# Patient Record
Sex: Male | Born: 1975 | Race: White | Hispanic: No | Marital: Single | Smoking: Never smoker
Health system: Southern US, Community
[De-identification: ages and names within clinical notes are randomized; demographics above are authoritative.]

## PROBLEM LIST (undated history)

## (undated) DIAGNOSIS — K7400 Hepatic fibrosis, unspecified: Secondary | ICD-10-CM

## (undated) DIAGNOSIS — K760 Fatty (change of) liver, not elsewhere classified: Secondary | ICD-10-CM

## (undated) DIAGNOSIS — Z1509 Genetic susceptibility to other malignant neoplasm: Secondary | ICD-10-CM

## (undated) DIAGNOSIS — R7303 Prediabetes: Secondary | ICD-10-CM

## (undated) DIAGNOSIS — Z87898 Personal history of other specified conditions: Secondary | ICD-10-CM

## (undated) DIAGNOSIS — Z860100 Personal history of colon polyps, unspecified: Secondary | ICD-10-CM

## (undated) DIAGNOSIS — F341 Dysthymic disorder: Secondary | ICD-10-CM

## (undated) DIAGNOSIS — K209 Esophagitis, unspecified without bleeding: Secondary | ICD-10-CM

## (undated) DIAGNOSIS — E119 Type 2 diabetes mellitus without complications: Secondary | ICD-10-CM

## (undated) DIAGNOSIS — F329 Major depressive disorder, single episode, unspecified: Secondary | ICD-10-CM

## (undated) DIAGNOSIS — I10 Essential (primary) hypertension: Secondary | ICD-10-CM

## (undated) DIAGNOSIS — F840 Autistic disorder: Secondary | ICD-10-CM

## (undated) DIAGNOSIS — E785 Hyperlipidemia, unspecified: Secondary | ICD-10-CM

## (undated) DIAGNOSIS — R160 Hepatomegaly, not elsewhere classified: Secondary | ICD-10-CM

## (undated) DIAGNOSIS — Z8709 Personal history of other diseases of the respiratory system: Secondary | ICD-10-CM

## (undated) DIAGNOSIS — T7840XA Allergy, unspecified, initial encounter: Secondary | ICD-10-CM

## (undated) DIAGNOSIS — J302 Other seasonal allergic rhinitis: Secondary | ICD-10-CM

## (undated) DIAGNOSIS — K222 Esophageal obstruction: Secondary | ICD-10-CM

## (undated) DIAGNOSIS — F419 Anxiety disorder, unspecified: Secondary | ICD-10-CM

## (undated) DIAGNOSIS — K219 Gastro-esophageal reflux disease without esophagitis: Secondary | ICD-10-CM

## (undated) DIAGNOSIS — F32A Depression, unspecified: Secondary | ICD-10-CM

## (undated) HISTORY — PX: DENTAL SURGERY: SHX609

## (undated) HISTORY — DX: Anxiety disorder, unspecified: F41.9

## (undated) HISTORY — DX: Major depressive disorder, single episode, unspecified: F32.9

## (undated) HISTORY — DX: Allergy, unspecified, initial encounter: T78.40XA

## (undated) HISTORY — DX: Essential (primary) hypertension: I10

## (undated) HISTORY — DX: Personal history of colon polyps, unspecified: Z86.0100

## (undated) HISTORY — DX: Esophageal obstruction: K22.2

## (undated) HISTORY — DX: Dysthymic disorder: F34.1

## (undated) HISTORY — DX: Fatty (change of) liver, not elsewhere classified: K76.0

## (undated) HISTORY — DX: Hepatic fibrosis, unspecified: K74.00

## (undated) HISTORY — DX: Type 2 diabetes mellitus without complications: E11.9

## (undated) HISTORY — DX: Other seasonal allergic rhinitis: J30.2

## (undated) HISTORY — DX: Gastro-esophageal reflux disease without esophagitis: K21.9

## (undated) HISTORY — DX: Hepatomegaly, not elsewhere classified: R16.0

## (undated) HISTORY — DX: Esophagitis, unspecified: K20.9

## (undated) HISTORY — DX: Hyperlipidemia, unspecified: E78.5

## (undated) HISTORY — DX: Esophagitis, unspecified without bleeding: K20.90

## (undated) HISTORY — PX: HERNIA REPAIR: SHX51

## (undated) HISTORY — DX: Personal history of colonic polyps: Z86.010

## (undated) HISTORY — DX: Depression, unspecified: F32.A

## (undated) HISTORY — DX: Genetic susceptibility to other malignant neoplasm: Z15.09

---

## 2002-03-02 ENCOUNTER — Encounter: Payer: Self-pay | Admitting: Gastroenterology

## 2002-03-02 ENCOUNTER — Ambulatory Visit (HOSPITAL_COMMUNITY): Admission: RE | Admit: 2002-03-02 | Discharge: 2002-03-02 | Payer: Self-pay | Admitting: Gastroenterology

## 2002-03-04 ENCOUNTER — Encounter: Payer: Self-pay | Admitting: Gastroenterology

## 2002-03-04 DIAGNOSIS — K209 Esophagitis, unspecified without bleeding: Secondary | ICD-10-CM | POA: Insufficient documentation

## 2004-01-17 ENCOUNTER — Ambulatory Visit: Payer: Self-pay | Admitting: Family Medicine

## 2004-01-25 ENCOUNTER — Ambulatory Visit: Payer: Self-pay | Admitting: Family Medicine

## 2004-03-21 ENCOUNTER — Ambulatory Visit: Payer: Self-pay | Admitting: Gastroenterology

## 2004-05-28 ENCOUNTER — Ambulatory Visit: Payer: Self-pay | Admitting: Family Medicine

## 2004-07-16 ENCOUNTER — Ambulatory Visit: Payer: Self-pay | Admitting: Family Medicine

## 2004-09-24 ENCOUNTER — Ambulatory Visit: Payer: Self-pay | Admitting: Family Medicine

## 2004-09-26 ENCOUNTER — Ambulatory Visit: Payer: Self-pay | Admitting: Family Medicine

## 2004-11-29 ENCOUNTER — Ambulatory Visit: Payer: Self-pay | Admitting: Family Medicine

## 2004-12-02 ENCOUNTER — Ambulatory Visit: Payer: Self-pay | Admitting: Family Medicine

## 2005-03-10 ENCOUNTER — Ambulatory Visit: Payer: Self-pay | Admitting: Family Medicine

## 2005-07-15 ENCOUNTER — Ambulatory Visit: Payer: Self-pay | Admitting: Family Medicine

## 2005-08-28 ENCOUNTER — Ambulatory Visit: Payer: Self-pay | Admitting: Family Medicine

## 2006-01-06 ENCOUNTER — Ambulatory Visit: Payer: Self-pay | Admitting: Family Medicine

## 2006-03-03 ENCOUNTER — Ambulatory Visit: Payer: Self-pay | Admitting: Family Medicine

## 2006-06-02 ENCOUNTER — Ambulatory Visit: Payer: Self-pay | Admitting: Family Medicine

## 2007-04-27 ENCOUNTER — Ambulatory Visit: Payer: Self-pay | Admitting: Gastroenterology

## 2007-05-13 ENCOUNTER — Encounter: Payer: Self-pay | Admitting: Gastroenterology

## 2007-05-13 ENCOUNTER — Ambulatory Visit: Payer: Self-pay | Admitting: Gastroenterology

## 2007-05-13 DIAGNOSIS — D126 Benign neoplasm of colon, unspecified: Secondary | ICD-10-CM | POA: Insufficient documentation

## 2007-10-07 ENCOUNTER — Telehealth: Payer: Self-pay | Admitting: Gastroenterology

## 2007-10-08 ENCOUNTER — Encounter: Payer: Self-pay | Admitting: Gastroenterology

## 2008-04-24 ENCOUNTER — Telehealth: Payer: Self-pay | Admitting: Gastroenterology

## 2008-04-25 ENCOUNTER — Encounter: Payer: Self-pay | Admitting: Gastroenterology

## 2008-04-25 DIAGNOSIS — K649 Unspecified hemorrhoids: Secondary | ICD-10-CM | POA: Insufficient documentation

## 2008-04-25 DIAGNOSIS — F341 Dysthymic disorder: Secondary | ICD-10-CM | POA: Insufficient documentation

## 2008-04-25 DIAGNOSIS — K219 Gastro-esophageal reflux disease without esophagitis: Secondary | ICD-10-CM | POA: Insufficient documentation

## 2008-04-26 ENCOUNTER — Ambulatory Visit: Payer: Self-pay | Admitting: Gastroenterology

## 2008-04-26 DIAGNOSIS — R1310 Dysphagia, unspecified: Secondary | ICD-10-CM | POA: Insufficient documentation

## 2009-04-09 ENCOUNTER — Emergency Department (HOSPITAL_COMMUNITY): Admission: EM | Admit: 2009-04-09 | Discharge: 2009-04-09 | Payer: Self-pay | Admitting: Emergency Medicine

## 2010-05-28 ENCOUNTER — Other Ambulatory Visit: Payer: Self-pay | Admitting: Sports Medicine

## 2010-05-28 DIAGNOSIS — M545 Low back pain: Secondary | ICD-10-CM

## 2010-05-28 DIAGNOSIS — M5430 Sciatica, unspecified side: Secondary | ICD-10-CM

## 2010-05-31 ENCOUNTER — Ambulatory Visit
Admission: RE | Admit: 2010-05-31 | Discharge: 2010-05-31 | Disposition: A | Discharge status: Home / Self Care | Payer: Medicare Other | Source: Ambulatory Visit | Attending: Sports Medicine | Admitting: Sports Medicine

## 2010-05-31 DIAGNOSIS — M545 Low back pain: Secondary | ICD-10-CM

## 2010-05-31 DIAGNOSIS — M5430 Sciatica, unspecified side: Secondary | ICD-10-CM

## 2010-06-13 ENCOUNTER — Ambulatory Visit: Payer: Medicare Other | Attending: Sports Medicine | Admitting: Physical Therapy

## 2010-06-13 DIAGNOSIS — IMO0001 Reserved for inherently not codable concepts without codable children: Secondary | ICD-10-CM | POA: Insufficient documentation

## 2010-06-13 DIAGNOSIS — M545 Low back pain, unspecified: Secondary | ICD-10-CM | POA: Insufficient documentation

## 2010-06-13 DIAGNOSIS — R5381 Other malaise: Secondary | ICD-10-CM | POA: Insufficient documentation

## 2010-06-13 DIAGNOSIS — R293 Abnormal posture: Secondary | ICD-10-CM | POA: Insufficient documentation

## 2010-06-17 ENCOUNTER — Ambulatory Visit: Payer: Medicare Other | Admitting: Physical Therapy

## 2010-06-20 ENCOUNTER — Ambulatory Visit: Payer: Medicare Other | Admitting: Physical Therapy

## 2010-06-25 ENCOUNTER — Ambulatory Visit: Payer: Medicare Other | Admitting: *Deleted

## 2010-06-28 ENCOUNTER — Ambulatory Visit: Payer: Medicare Other | Admitting: *Deleted

## 2010-07-03 ENCOUNTER — Ambulatory Visit: Payer: Medicare Other | Admitting: Physical Therapy

## 2010-07-05 ENCOUNTER — Ambulatory Visit: Payer: Medicare Other | Attending: Sports Medicine | Admitting: *Deleted

## 2010-07-05 DIAGNOSIS — M545 Low back pain, unspecified: Secondary | ICD-10-CM | POA: Insufficient documentation

## 2010-07-05 DIAGNOSIS — R293 Abnormal posture: Secondary | ICD-10-CM | POA: Insufficient documentation

## 2010-07-05 DIAGNOSIS — R5381 Other malaise: Secondary | ICD-10-CM | POA: Insufficient documentation

## 2010-07-05 DIAGNOSIS — IMO0001 Reserved for inherently not codable concepts without codable children: Secondary | ICD-10-CM | POA: Insufficient documentation

## 2010-07-08 ENCOUNTER — Ambulatory Visit: Payer: Medicare Other | Admitting: Physical Therapy

## 2010-07-11 ENCOUNTER — Ambulatory Visit: Payer: Medicare Other | Admitting: *Deleted

## 2010-07-16 ENCOUNTER — Ambulatory Visit: Payer: Medicare Other | Admitting: Physical Therapy

## 2010-07-18 ENCOUNTER — Ambulatory Visit: Payer: Medicare Other | Admitting: Physical Therapy

## 2010-07-23 ENCOUNTER — Ambulatory Visit: Payer: Medicare Other | Admitting: *Deleted

## 2010-07-25 ENCOUNTER — Ambulatory Visit: Payer: Medicare Other | Admitting: *Deleted

## 2010-07-30 ENCOUNTER — Encounter: Payer: Medicare Other | Admitting: Physical Therapy

## 2010-07-31 ENCOUNTER — Ambulatory Visit: Payer: Medicare Other | Admitting: Physical Therapy

## 2010-08-01 ENCOUNTER — Ambulatory Visit: Payer: Medicare Other | Admitting: Physical Therapy

## 2010-08-06 ENCOUNTER — Ambulatory Visit: Payer: Medicare Other | Attending: Sports Medicine | Admitting: *Deleted

## 2010-08-06 DIAGNOSIS — M545 Low back pain, unspecified: Secondary | ICD-10-CM | POA: Insufficient documentation

## 2010-08-06 DIAGNOSIS — R5381 Other malaise: Secondary | ICD-10-CM | POA: Insufficient documentation

## 2010-08-06 DIAGNOSIS — R293 Abnormal posture: Secondary | ICD-10-CM | POA: Insufficient documentation

## 2010-08-06 DIAGNOSIS — IMO0001 Reserved for inherently not codable concepts without codable children: Secondary | ICD-10-CM | POA: Insufficient documentation

## 2010-08-08 ENCOUNTER — Encounter: Payer: Medicare Other | Admitting: Physical Therapy

## 2010-08-13 ENCOUNTER — Ambulatory Visit: Payer: Medicare Other | Admitting: Physical Therapy

## 2010-08-15 ENCOUNTER — Ambulatory Visit: Payer: Medicare Other | Admitting: *Deleted

## 2010-09-24 ENCOUNTER — Emergency Department (HOSPITAL_COMMUNITY)
Admission: EM | Admit: 2010-09-24 | Discharge: 2010-09-24 | Disposition: A | Discharge status: Home / Self Care | Payer: Medicare Other | Attending: Emergency Medicine | Admitting: Emergency Medicine

## 2010-09-24 DIAGNOSIS — K219 Gastro-esophageal reflux disease without esophagitis: Secondary | ICD-10-CM | POA: Insufficient documentation

## 2010-09-24 DIAGNOSIS — N5089 Other specified disorders of the male genital organs: Secondary | ICD-10-CM | POA: Insufficient documentation

## 2010-09-24 DIAGNOSIS — R300 Dysuria: Secondary | ICD-10-CM | POA: Insufficient documentation

## 2010-09-24 LAB — URINALYSIS, ROUTINE W REFLEX MICROSCOPIC
Bilirubin Urine: NEGATIVE
Glucose, UA: NEGATIVE mg/dL
Hgb urine dipstick: NEGATIVE
Ketones, ur: 15 mg/dL — AB
Leukocytes, UA: NEGATIVE
Nitrite: NEGATIVE
Protein, ur: NEGATIVE mg/dL
Specific Gravity, Urine: 1.028 (ref 1.005–1.030)
Urobilinogen, UA: 1 mg/dL (ref 0.0–1.0)
pH: 6 (ref 5.0–8.0)

## 2010-10-03 ENCOUNTER — Telehealth: Payer: Self-pay | Admitting: Gastroenterology

## 2010-10-03 NOTE — Telephone Encounter (Signed)
Try guanefisin syrup

## 2010-10-03 NOTE — Telephone Encounter (Signed)
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 Pt has not been seen in our ofc since 04/26/2008. COLON normal 04/25/08, EGD with [facility] 04/26/08 for stricture. Hx of anxiety/depression, hemorrrhoids, GERD COLON polyps. Mom reports pt has had a hacking cough since 09/09/10 when he was taken off Abilify and placed on Propranolol. The drug was changed to help pt lose weight and ordered by Dr Duayne Cal pt's Mental Health Doctor. Dr Duayne Cal is only in the office the 1st Tuesday of every month. Pt's also has a problem with swelling in the scrotal area and has seen a Urologist and he is on Doxycycline per MOM. Pt went to the ER on 09/24/10 for c/o

## 2010-10-03 NOTE — Telephone Encounter (Signed)
Dr Arlyce Dice, while waiting for Dr [facility] to call back, do you have any recommendations for coughing problem and or irritated throat? Mom reports pt was on Nexium BID, but insurance will only pay for daily?

## 2010-10-03 NOTE — Telephone Encounter (Signed)
Spoke with RN Tim Nelson at Dr Tim Nelson ofc and informed her of pt's problem Tim Nelson asked that I ask the mother to call after I spoke with her so she can make an appt for problem to be assessed. Informed her and mother that Dr Arlyce Dice ordered guaifenesin syrup for the cough. Instructed mother that if he can swallow a big pill, plain Mucinex is ok. Mother stated understanding.

## 2010-10-21 ENCOUNTER — Telehealth: Payer: Self-pay | Admitting: Gastroenterology

## 2010-10-21 NOTE — Telephone Encounter (Signed)
Left message for pt to call back.  Pts sister states that the pt is having problems with his reflux. Requesting a sooner appt. Pt scheduled to see Dr. Arlyce Dice 10/23/10@8 :45am. They are aware of appt date and time.

## 2010-10-23 ENCOUNTER — Ambulatory Visit (INDEPENDENT_AMBULATORY_CARE_PROVIDER_SITE_OTHER): Payer: Medicare Other | Admitting: Gastroenterology

## 2010-10-23 ENCOUNTER — Encounter: Payer: Self-pay | Admitting: Gastroenterology

## 2010-10-23 DIAGNOSIS — K219 Gastro-esophageal reflux disease without esophagitis: Secondary | ICD-10-CM

## 2010-10-23 DIAGNOSIS — D126 Benign neoplasm of colon, unspecified: Secondary | ICD-10-CM

## 2010-10-23 DIAGNOSIS — Z800 Family history of malignant neoplasm of digestive organs: Secondary | ICD-10-CM | POA: Insufficient documentation

## 2010-10-23 NOTE — Progress Notes (Signed)
Mr. Mitton has returned for evaluation of severe pyrosis and dysphagia. He has a history of GERD and esophageal stricture. He was last dilated in 2010. Exam was otherwise unremarkable.  He is complaining of severe pyrosis with sore throat and cough. Symptoms occur throughout the day. He also is having recurrent dysphagia. He's on no gastric irritants but he does use a steroidal nasal spray   Review of Systems: He complains of low back pain due to lumbar disc disease. Since psychotropics have been changed. Pertinent positive and negative review of systems were noted in the above HPI section. All other review of systems were otherwise negative.    Current Medications, Allergies, Past Medical History, Past Surgical History, Family History and Social History were reviewed in [employer] electronic medical record  Vital signs were reviewed in today's medical record. Physical Exam: General: Well developed , well nourished, no acute distress Head: Normocephalic and atraumatic Eyes:  sclerae anicteric, EOMI Ears: Normal auditory acuity Mouth: No deformity or lesions Lungs: Clear throughout to auscultation Heart: Regular rate and rhythm; no murmurs, rubs or bruits Abdomen: Soft, non tender and non distended. No masses, hepatosplenomegaly or hernias noted. Normal Bowel sounds Rectal:deferred Musculoskeletal: Symmetrical with no gross deformities  Pulses:  Normal pulses noted Extremities: No clubbing, cyanosis, edema or deformities noted Neurological: Alert oriented x 4, grossly nonfocal Psychological:  Alert and cooperative. Normal mood and affect

## 2010-10-23 NOTE — Assessment & Plan Note (Signed)
Last colonoscopy 2010. Plan followup colonoscopy 2015

## 2010-10-23 NOTE — Patient Instructions (Signed)
Your Endoscopy is scheduled on 10/29/2010 at 2pm We are giving you samples of Aciphex to take once in the AM before breakfast and once before supper

## 2010-10-23 NOTE — Assessment & Plan Note (Addendum)
Patient remains symptomatic despite daily Dexilant. Candida esophagitis should be ruled out in view of his use of fluticasone nasal spray.  With recurrent dysphagia an esophageal stricture should be ruled out.  Current medications #1 empiric trial of AcipHex 20 mg before breakfast and dinner #2 repeat upper endoscopy with dilatation as indicated

## 2010-10-29 ENCOUNTER — Ambulatory Visit (AMBULATORY_SURGERY_CENTER): Payer: Medicare Other | Admitting: Gastroenterology

## 2010-10-29 ENCOUNTER — Encounter: Payer: Self-pay | Admitting: Gastroenterology

## 2010-10-29 VITALS — BP 143/97 | HR 90 | Temp 98.9°F | Resp 24 | Ht 70.0 in | Wt 191.0 lb

## 2010-10-29 DIAGNOSIS — K219 Gastro-esophageal reflux disease without esophagitis: Secondary | ICD-10-CM

## 2010-10-29 DIAGNOSIS — K222 Esophageal obstruction: Secondary | ICD-10-CM

## 2010-10-29 MED ORDER — SODIUM CHLORIDE 0.9 % IV SOLN: 500.0000 mL | INTRAVENOUS | Status: DC

## 2010-10-29 NOTE — Progress Notes (Signed)
Pt was sedated with propofol by Brennan Bailey, CRNA.  Maw  Pt tolerated the EGD very well. maw

## 2010-10-29 NOTE — Patient Instructions (Signed)
Please review discharge instructions (blue and green sheets)  Resume your normal medications  Follow dilation diet today- see handout

## 2010-10-30 ENCOUNTER — Telehealth: Payer: Self-pay | Admitting: *Deleted

## 2010-10-30 NOTE — Telephone Encounter (Signed)

## 2010-11-04 ENCOUNTER — Telehealth: Payer: Self-pay | Admitting: Gastroenterology

## 2010-11-04 MED ORDER — RABEPRAZOLE SODIUM 20 MG PO TBEC
20.0000 mg | DELAYED_RELEASE_TABLET | Freq: Two times a day (BID) | ORAL | Status: DC
Start: 2010-11-04 — End: 2010-12-11

## 2010-11-04 NOTE — Telephone Encounter (Signed)
Pt states he did not have any more of the aciphex. Would like for [place] to call in the script for him to take and see it that helps before scheduling the GES. Dr. Arlyce Dice notified.

## 2010-11-04 NOTE — Telephone Encounter (Signed)
Ok to send in Rx?  

## 2010-11-04 NOTE — Telephone Encounter (Signed)
RX sent to pharmacy  

## 2010-11-04 NOTE — Telephone Encounter (Signed)
Make sure he is on bid aciphex. Schedule GES then OV

## 2010-11-04 NOTE — Telephone Encounter (Signed)
Pts mother is calling stating that he started coughing again Thursday night and he is complaining of both sides of his throat being sore. Pt has egd on 10/29/10 and results were normal. Pts mother wants to know if Dr. Arlyce Dice has any suggestions for the pt. Please advise.

## 2010-11-05 ENCOUNTER — Telehealth: Payer: Self-pay | Admitting: Gastroenterology

## 2010-11-05 MED ORDER — PANTOPRAZOLE SODIUM 40 MG PO TBEC: 40.0000 mg | DELAYED_RELEASE_TABLET | Freq: Every day | ORAL | Status: DC

## 2010-11-05 NOTE — Telephone Encounter (Signed)
Called pt to inform new medication sent to pharmacy

## 2010-11-05 NOTE — Telephone Encounter (Signed)
Dr Arlyce Dice, Aciphex to expensive for pt. What would you like to prescribe for him

## 2010-11-05 NOTE — Telephone Encounter (Signed)
protonix 40mg qd

## 2010-11-09 ENCOUNTER — Emergency Department (HOSPITAL_COMMUNITY)
Admission: EM | Admit: 2010-11-09 | Discharge: 2010-11-09 | Disposition: A | Discharge status: Home / Self Care | Payer: Medicare Other | Attending: Emergency Medicine | Admitting: Emergency Medicine

## 2010-11-09 ENCOUNTER — Emergency Department (HOSPITAL_COMMUNITY): Payer: Medicare Other

## 2010-11-09 DIAGNOSIS — R05 Cough: Secondary | ICD-10-CM | POA: Insufficient documentation

## 2010-11-09 DIAGNOSIS — K219 Gastro-esophageal reflux disease without esophagitis: Secondary | ICD-10-CM | POA: Insufficient documentation

## 2010-11-09 DIAGNOSIS — R059 Cough, unspecified: Secondary | ICD-10-CM | POA: Insufficient documentation

## 2010-11-18 ENCOUNTER — Other Ambulatory Visit: Payer: Self-pay | Admitting: Gastroenterology

## 2010-11-18 ENCOUNTER — Telehealth: Payer: Self-pay | Admitting: Gastroenterology

## 2010-11-18 MED ORDER — OMEPRAZOLE 20 MG PO CPDR: 20.0000 mg | DELAYED_RELEASE_CAPSULE | Freq: Two times a day (BID) | ORAL | Status: DC

## 2010-11-18 NOTE — Telephone Encounter (Signed)
Pts mother states that he is still coughing, she states it is a hacking agitated cough. States that he has already had an EGD done. Mother wants to know what the next step is. Dr. Arlyce Dice please advise.

## 2010-11-18 NOTE — Telephone Encounter (Signed)
Pt aware.

## 2010-11-18 NOTE — Telephone Encounter (Signed)
Mother states that his insurance denied the aciphex-Medicare and Medicaid both denied it. He is taking protonix 40mg  daily. Pt scheduled for ges 12/09/10 at [facility] arrival time 12:45pm, scan at 1pm. Pt scheduled to see Dr. Arlyce Dice 12/11/10@9 :30am. Pt to be NPO after 7am on 12/09/10 and hold their protonix the day before the ges. Pt aware.

## 2010-11-18 NOTE — Telephone Encounter (Signed)
Can try omeprazole twice a day

## 2010-11-18 NOTE — Telephone Encounter (Signed)
Is he on aciphex twice a day? Need repeat GES, then OV

## 2010-11-19 ENCOUNTER — Ambulatory Visit: Payer: Medicare Other | Admitting: Gastroenterology

## 2010-12-09 ENCOUNTER — Encounter (HOSPITAL_COMMUNITY)
Admission: RE | Admit: 2010-12-09 | Discharge: 2010-12-09 | Disposition: A | Discharge status: Home / Self Care | Payer: Medicare Other | Source: Ambulatory Visit | Attending: Gastroenterology | Admitting: Gastroenterology

## 2010-12-09 DIAGNOSIS — R109 Unspecified abdominal pain: Secondary | ICD-10-CM | POA: Insufficient documentation

## 2010-12-09 MED ORDER — TECHNETIUM TC 99M SULFUR COLLOID
2.0000 | Freq: Once | INTRAVENOUS | Status: AC | PRN
  Administered 2010-12-09: 2 via INTRAVENOUS

## 2010-12-11 ENCOUNTER — Encounter: Payer: Self-pay | Admitting: Gastroenterology

## 2010-12-11 ENCOUNTER — Ambulatory Visit (INDEPENDENT_AMBULATORY_CARE_PROVIDER_SITE_OTHER): Payer: Medicare Other | Admitting: Gastroenterology

## 2010-12-11 DIAGNOSIS — Z800 Family history of malignant neoplasm of digestive organs: Secondary | ICD-10-CM

## 2010-12-11 DIAGNOSIS — K219 Gastro-esophageal reflux disease without esophagitis: Secondary | ICD-10-CM

## 2010-12-11 DIAGNOSIS — K222 Esophageal obstruction: Secondary | ICD-10-CM

## 2010-12-11 NOTE — Progress Notes (Signed)
Pt already scheduled for a follow up appointment

## 2010-12-11 NOTE — Assessment & Plan Note (Signed)
Asymptomatic on omeprazole.

## 2010-12-11 NOTE — Assessment & Plan Note (Signed)
Plan followup colonoscopy 2014 

## 2010-12-11 NOTE — Patient Instructions (Signed)
Follow up as needed

## 2010-12-11 NOTE — Progress Notes (Signed)
History of Present Illness:  Mr. Wares has returned following upper endoscopy with [facility] dilatation of a distal esophageal stricture. Dysphagia has resolved. In addition, his cough has subsided. He denies pyrosis. He remains on omeprazole 20 mg daily. He complains of excess gas but notes that he consumes a large amount of water throughout the day. Gas has been greatly improved with taking Gas-X.    Review of Systems: Pertinent positive and negative review of systems were noted in the above HPI section. All other review of systems were otherwise negative.    Current Medications, Allergies, Past Medical History, Past Surgical History, Family History and Social History were reviewed in [employer] electronic medical record  Vital signs were reviewed in today's medical record. Physical Exam: General: Well developed , well nourished, no acute distress

## 2012-02-04 HISTORY — PX: OTHER SURGICAL HISTORY: SHX169

## 2012-05-03 ENCOUNTER — Encounter: Payer: Self-pay | Admitting: Gastroenterology

## 2012-06-08 ENCOUNTER — Encounter: Payer: Self-pay | Admitting: Gastroenterology

## 2012-06-14 ENCOUNTER — Ambulatory Visit (AMBULATORY_SURGERY_CENTER): Payer: Medicare Other | Admitting: *Deleted

## 2012-06-14 VITALS — Ht 71.0 in | Wt 233.0 lb

## 2012-06-14 DIAGNOSIS — Z1211 Encounter for screening for malignant neoplasm of colon: Secondary | ICD-10-CM

## 2012-06-14 DIAGNOSIS — Z800 Family history of malignant neoplasm of digestive organs: Secondary | ICD-10-CM

## 2012-06-14 DIAGNOSIS — Z86010 Personal history of colonic polyps: Secondary | ICD-10-CM

## 2012-06-14 MED ORDER — NA SULFATE-K SULFATE-MG SULF 17.5-3.13-1.6 GM/177ML PO SOLN: ORAL | Status: DC

## 2012-06-21 ENCOUNTER — Ambulatory Visit (AMBULATORY_SURGERY_CENTER): Payer: Medicare Other | Admitting: Gastroenterology

## 2012-06-21 ENCOUNTER — Encounter: Payer: Self-pay | Admitting: Gastroenterology

## 2012-06-21 VITALS — BP 125/76 | HR 73 | Temp 97.2°F | Resp 17 | Ht 71.0 in | Wt 233.0 lb

## 2012-06-21 DIAGNOSIS — Z86010 Personal history of colonic polyps: Secondary | ICD-10-CM

## 2012-06-21 DIAGNOSIS — Z800 Family history of malignant neoplasm of digestive organs: Secondary | ICD-10-CM

## 2012-06-21 DIAGNOSIS — Z1211 Encounter for screening for malignant neoplasm of colon: Secondary | ICD-10-CM

## 2012-06-21 MED ORDER — SODIUM CHLORIDE 0.9 % IV SOLN: 500.0000 mL | INTRAVENOUS | Status: DC

## 2012-06-21 NOTE — Patient Instructions (Signed)
Normal colon exam today. Repeat colonoscopy in 5 years. Resume current medications.  Call us with any questions or concerns. Thank you!!  YOU HAD AN ENDOSCOPIC PROCEDURE TODAY AT THE [facility]: Refer to the procedure report that was given to you for any specific questions about what was found during the examination.  If the procedure report does not answer your questions, please call your gastroenterologist to clarify.  If you requested that your care partner not be given the details of your procedure findings, then the procedure report has been included in a sealed envelope for you to review at your convenience later.  YOU SHOULD EXPECT: Some feelings of bloating in the abdomen. Passage of more gas than usual.  Walking can help get rid of the air that was put into your GI tract during the procedure and reduce the bloating. If you had a lower endoscopy (such as a colonoscopy or flexible sigmoidoscopy) you may notice spotting of blood in your stool or on the toilet paper. If you underwent a bowel prep for your procedure, then you may not have a normal bowel movement for a few days.  DIET: Your first meal following the procedure should be a light meal and then it is ok to progress to your normal diet.  A half-sandwich or bowl of soup is an example of a good first meal.  Heavy or fried foods are harder to digest and may make you feel nauseous or bloated.  Likewise meals heavy in dairy and vegetables can cause extra gas to form and this can also increase the bloating.  Drink plenty of fluids but you should avoid alcoholic beverages for 24 hours.  ACTIVITY: Your care partner should take you home directly after the procedure.  You should plan to take it easy, moving slowly for the rest of the day.  You can resume normal activity the day after the procedure however you should NOT DRIVE or use heavy machinery for 24 hours (because of the sedation medicines used during the test).    SYMPTOMS TO  REPORT IMMEDIATELY: A gastroenterologist can be reached at any hour.  During normal business hours, 8:30 AM to 5:00 PM Monday through Friday, call [phone].  After hours and on weekends, please call the GI answering service at [phone] who will take a message and have the physician on call contact you.   Following lower endoscopy (colonoscopy or flexible sigmoidoscopy):  Excessive amounts of blood in the stool  Significant tenderness or worsening of abdominal pains  Swelling of the abdomen that is new, acute  Fever of 100F or higher  Following upper endoscopy (EGD)  Vomiting of blood or coffee ground material  New chest pain or pain under the shoulder blades  Painful or persistently difficult swallowing  New shortness of breath  Fever of 100F or higher  Black, tarry-looking stools  FOLLOW UP: If any biopsies were taken you will be contacted by phone or by letter within the next 1-3 weeks.  Call your gastroenterologist if you have not heard about the biopsies in 3 weeks.  Our staff will call the home number listed on your records the next business day following your procedure to check on you and address any questions or concerns that you may have at that time regarding the information given to you following your procedure. This is a courtesy call and so if there is no answer at the home number and we have not heard from you through the emergency   physician on call, we will assume that you have returned to your regular daily activities without incident.  SIGNATURES/CONFIDENTIALITY: You and/or your care partner have signed paperwork which will be entered into your electronic medical record.  These signatures attest to the fact that that the information above on your After Visit Summary has been reviewed and is understood.  Full responsibility of the confidentiality of this discharge information lies with you and/or your care-partner. 

## 2012-06-21 NOTE — Progress Notes (Signed)
NO EGG OR SOY ALLERGY. EWM 

## 2012-06-21 NOTE — Op Note (Signed)
 [facility] [address] [place], [address]   COLONOSCOPY PROCEDURE REPORT  PATIENT: Tim Nelson, Tim Nelson  MR#: [id number] BIRTHDATE: Oct 24, 1975 , 36  yrs. old GENDER: Male ENDOSCOPIST: Louis Meckel, MD REFERRED MV:HQIONGE Lysbeth Galas, M.D. PROCEDURE DATE:  06/21/2012 PROCEDURE:   Colonoscopy, diagnostic ASA CLASS:   Class II INDICATIONS:Patient's personal history of adenomatous colon polyps 2009, and Patient's immediate family history of colon cancer(brother with colon cancer age 30) MEDICATIONS: MAC sedation, administered by CRNA and propofol (Diprivan) 250mg  IV  DESCRIPTION OF PROCEDURE:   After the risks benefits and alternatives of the procedure were thoroughly explained, informed consent was obtained.  A digital rectal exam revealed no abnormalities of the rectum.   The LB [id number]  endoscope was introduced through the anus and advanced to the cecum, which was identified by both the appendix and ileocecal valve. No adverse events experienced.   The quality of the prep was Suprep good  The instrument was then slowly withdrawn as the colon was fully examined.      COLON FINDINGS: A normal appearing cecum, ileocecal valve, and appendiceal orifice were identified.  The ascending, hepatic flexure, transverse, splenic flexure, descending, sigmoid colon and rectum appeared unremarkable.  No polyps or cancers were seen. Retroflexed views revealed no abnormalities. The time to cecum=1 minutes 48 seconds.  Withdrawal time=7 minutes 17 seconds.  The scope was withdrawn and the procedure completed. COMPLICATIONS: There were no complications.  ENDOSCOPIC IMPRESSION: Normal colon  RECOMMENDATIONS: Given your significant family history of colon cancer, you should have a repeat colonoscopy in 5 years   eSigned:  Louis Meckel, MD 06/21/2012 10:24 AM   cc:

## 2012-06-21 NOTE — Progress Notes (Signed)
Patient did not experience any of the following events: a burn prior to discharge; a fall within the facility; wrong site/side/patient/procedure/implant event; or a hospital transfer or hospital admission upon discharge from the facility. (G8907) Patient did not have preoperative order for IV antibiotic SSI prophylaxis. (G8918)  

## 2012-06-21 NOTE — Progress Notes (Signed)
Stable to RR 

## 2012-06-22 ENCOUNTER — Telehealth: Payer: Self-pay

## 2012-06-22 NOTE — Telephone Encounter (Signed)
  Follow up Call-  Call back number 06/21/2012 10/29/2010  Post procedure Call Back phone  # [phone]  Permission to leave phone message Yes -     Patient questions:  Do you have a fever, pain , or abdominal swelling? no Pain Score  0 *  Have you tolerated food without any problems? yes  Have you been able to return to your normal activities? yes  Do you have any questions about your discharge instructions: Diet   no Medications  no Follow up visit  no  Do you have questions or concerns about your Care? no  Actions: * If pain score is 4 or above: No action needed, pain <4.

## 2012-07-18 ENCOUNTER — Emergency Department (HOSPITAL_COMMUNITY)
Admission: EM | Admit: 2012-07-18 | Discharge: 2012-07-18 | Disposition: A | Discharge status: Home / Self Care | Payer: Medicare Other | Attending: Emergency Medicine | Admitting: Emergency Medicine

## 2012-07-18 ENCOUNTER — Encounter (HOSPITAL_COMMUNITY): Payer: Self-pay | Admitting: *Deleted

## 2012-07-18 DIAGNOSIS — F3289 Other specified depressive episodes: Secondary | ICD-10-CM | POA: Insufficient documentation

## 2012-07-18 DIAGNOSIS — K219 Gastro-esophageal reflux disease without esophagitis: Secondary | ICD-10-CM | POA: Insufficient documentation

## 2012-07-18 DIAGNOSIS — E785 Hyperlipidemia, unspecified: Secondary | ICD-10-CM | POA: Insufficient documentation

## 2012-07-18 DIAGNOSIS — Z79899 Other long term (current) drug therapy: Secondary | ICD-10-CM | POA: Insufficient documentation

## 2012-07-18 DIAGNOSIS — Z860100 Personal history of colon polyps, unspecified: Secondary | ICD-10-CM | POA: Insufficient documentation

## 2012-07-18 DIAGNOSIS — Z8719 Personal history of other diseases of the digestive system: Secondary | ICD-10-CM | POA: Insufficient documentation

## 2012-07-18 DIAGNOSIS — M5431 Sciatica, right side: Secondary | ICD-10-CM

## 2012-07-18 DIAGNOSIS — M5430 Sciatica, unspecified side: Secondary | ICD-10-CM | POA: Insufficient documentation

## 2012-07-18 DIAGNOSIS — F341 Dysthymic disorder: Secondary | ICD-10-CM | POA: Insufficient documentation

## 2012-07-18 DIAGNOSIS — IMO0002 Reserved for concepts with insufficient information to code with codable children: Secondary | ICD-10-CM | POA: Insufficient documentation

## 2012-07-18 DIAGNOSIS — M5432 Sciatica, left side: Secondary | ICD-10-CM

## 2012-07-18 DIAGNOSIS — M549 Dorsalgia, unspecified: Secondary | ICD-10-CM

## 2012-07-18 DIAGNOSIS — F329 Major depressive disorder, single episode, unspecified: Secondary | ICD-10-CM | POA: Insufficient documentation

## 2012-07-18 MED ORDER — CYCLOBENZAPRINE HCL 10 MG PO TABS: 10.0000 mg | ORAL_TABLET | Freq: Two times a day (BID) | ORAL | Status: DC | PRN

## 2012-07-18 MED ORDER — PREDNISONE 20 MG PO TABS: 50.0000 mg | ORAL_TABLET | Freq: Every day | ORAL | Status: DC

## 2012-07-18 MED ORDER — HYDROCODONE-ACETAMINOPHEN 5-325 MG PO TABS: 1.0000 | ORAL_TABLET | ORAL | Status: DC | PRN

## 2012-07-18 NOTE — ED Provider Notes (Signed)
Medical screening examination/treatment/procedure(s) were performed by non-physician practitioner and as supervising physician I was immediately available for consultation/collaboration.  Lavita Pontius T Taela Charbonneau, MD 07/18/12 1536 

## 2012-07-18 NOTE — ED Notes (Signed)
Pt states last night he began having low back pain.  Pt has hx of lumbar disc degeneration.  Pt was treated with physical therapy and medication.  Pt denies trauma/injury to low back.  He states he spent a lot of time on his feet yesterday, which is unusual.  Pt states he spent a lot of time fishing yesterday.  Pt states pain radiates into both legs.

## 2012-07-18 NOTE — ED Provider Notes (Signed)
History     CSN: [id number]  Arrival date & time 07/18/12  1154   None     Chief Complaint  Patient presents with  . Back Pain    (Consider location/radiation/quality/duration/timing/severity/associated sxs/prior treatment) HPI  Patient is a 37 year old male past medical history significant for lumbar disc degenerative disease, sciatica, anxiety, depression and presented to the emergency department for recurrent constant throbbing lumbar back pain radiating down bilateral legs that began a few days ago after a fishing trip. Patient rates pain 10 out of 10 with no alleviating factors. Aggravating factors include ambulating. Patient states he had a flareup of his sciatica 2-3 years ago was given steroids and pain medication and went to physical therapy with resolution of symptoms. Patient states this feels exactly like. Denies bladder or bowel incontinence, fever, chills, IV drug use, active cancer.  Past Medical History  Diagnosis Date  . Anxiety and depression   . Dysthymic disorder   . Esophagitis, unspecified   . Esophageal stricture   . Hemorrhoids   . Hx of colonic polyps   . Hyperlipemia   . Seasonal allergies   . GERD (gastroesophageal reflux disease)     Past Surgical History  Procedure Laterality Date  . Hernia repair    . Dental surgery      Family History  Problem Relation Age of Onset  . Colon cancer Brother 63  . Colon cancer Maternal Grandfather 50  . Esophageal cancer Maternal Grandfather   . Liver cancer Brother   . Irritable bowel syndrome Mother   . Colon polyps Mother     History  Substance Use Topics  . Smoking status: Never Smoker   . Smokeless tobacco: Never Used  . Alcohol Use: No      Review of Systems  Constitutional: Negative for fever and chills.  HENT: Negative for neck pain.   Respiratory: Negative for shortness of breath.   Cardiovascular: Negative for chest pain.  Genitourinary: Negative for difficulty urinating.    Musculoskeletal: Positive for back pain.  Skin: Negative.     Allergies  Cefaclor and Dimetapp dm cold-cough  Home Medications   Current Outpatient Rx  Name  Route  Sig  Dispense  Refill  . acetaminophen (TYLENOL) 500 MG tablet   Oral   Take 500 mg by mouth every 6 (six) hours as needed for pain.         . ARIPiprazole (ABILIFY) 2 MG tablet   Oral   Take 2 mg by mouth daily.         [name] Kitchen atorvastatin (LIPITOR) 10 MG tablet   Oral   Take 10 mg by mouth daily.           Tery Sanfilippo Calcium (STOOL SOFTENER PO)   Oral   Take 1 capsule by mouth as needed.           . fenofibrate micronized (ANTARA) 130 MG capsule   Oral   Take 134 mg by mouth daily before breakfast.          . fexofenadine (ALLEGRA) 180 MG tablet   Oral   Take 180 mg by mouth daily.           . fluticasone (FLONASE) 50 MCG/ACT nasal spray   Nasal   Place 1 spray into the nose daily.          [name] Kitchen omeprazole (PRILOSEC) 20 MG capsule   Oral   Take 20 mg by mouth 2 (two) times daily.         [name] Kitchen  sertraline (ZOLOFT) 50 MG tablet   Oral   Take 50 mg by mouth daily.           . simethicone (MYLICON) 125 MG chewable tablet   Oral   Chew 125 mg by mouth as needed.           . cyclobenzaprine (FLEXERIL) 10 MG tablet   Oral   Take 1 tablet (10 mg total) by mouth 2 (two) times daily as needed for muscle spasms.   20 tablet   0   . HYDROcodone-acetaminophen (NORCO/VICODIN) 5-325 MG per tablet   Oral   Take 1 tablet by mouth every 4 (four) hours as needed for pain.   10 tablet   0   . predniSONE (DELTASONE) 20 MG tablet   Oral   Take 2.5 tablets (50 mg total) by mouth daily.   10 tablet   0     BP 127/86  Pulse 55  Temp(Src) 98.6 F (37 C) (Oral)  Resp 20  SpO2 100%  Physical Exam  Constitutional: He is oriented to person, place, and time. He appears well-developed and well-nourished. No distress.  HENT:  Head: Normocephalic and atraumatic.  Eyes: Conjunctivae are normal.   Neck: Neck supple.  Musculoskeletal: Normal range of motion.       Lumbar back: He exhibits tenderness. He exhibits normal range of motion, no bony tenderness, no swelling, no edema, no deformity and normal pulse.       Back:       Legs: Neurological: He is alert and oriented to person, place, and time. He has normal strength. No sensory deficit. Gait normal.  Skin: Skin is warm and dry. He is not diaphoretic.  Psychiatric: He has a normal mood and affect.    ED Course  Procedures (including critical care time)  Labs Reviewed - No data to display No results found.   1. Back pain   2. Bilateral sciatica       MDM  Patient with back pain.  No neurological deficits and normal neuro exam.  Patient can walk but states is painful.  No loss of bowel or bladder control.  No concern for cauda equina.  No fever, night sweats, weight loss, h/o cancer, IVDU.  RICE protocol and pain medicine indicated and discussed with patient. Patient advised to f/u with his orthopedist for recurrence of symptoms. Patient is agreeable to plan. Patient is stable at time of discharge              Jeannetta Ellis, PA-C 07/18/12 1455

## 2012-09-21 ENCOUNTER — Encounter (HOSPITAL_COMMUNITY): Payer: Self-pay | Admitting: Emergency Medicine

## 2012-09-21 ENCOUNTER — Emergency Department (HOSPITAL_COMMUNITY)
Admission: EM | Admit: 2012-09-21 | Discharge: 2012-09-21 | Disposition: A | Discharge status: Home / Self Care | Payer: Medicare Other | Attending: Emergency Medicine | Admitting: Emergency Medicine

## 2012-09-21 DIAGNOSIS — G43909 Migraine, unspecified, not intractable, without status migrainosus: Secondary | ICD-10-CM | POA: Insufficient documentation

## 2012-09-21 DIAGNOSIS — E785 Hyperlipidemia, unspecified: Secondary | ICD-10-CM | POA: Insufficient documentation

## 2012-09-21 DIAGNOSIS — Z8709 Personal history of other diseases of the respiratory system: Secondary | ICD-10-CM | POA: Insufficient documentation

## 2012-09-21 DIAGNOSIS — IMO0002 Reserved for concepts with insufficient information to code with codable children: Secondary | ICD-10-CM | POA: Insufficient documentation

## 2012-09-21 DIAGNOSIS — K209 Esophagitis, unspecified without bleeding: Secondary | ICD-10-CM | POA: Insufficient documentation

## 2012-09-21 DIAGNOSIS — K219 Gastro-esophageal reflux disease without esophagitis: Secondary | ICD-10-CM | POA: Insufficient documentation

## 2012-09-21 DIAGNOSIS — Z860100 Personal history of colon polyps, unspecified: Secondary | ICD-10-CM | POA: Insufficient documentation

## 2012-09-21 DIAGNOSIS — R079 Chest pain, unspecified: Secondary | ICD-10-CM | POA: Insufficient documentation

## 2012-09-21 DIAGNOSIS — R0602 Shortness of breath: Secondary | ICD-10-CM | POA: Insufficient documentation

## 2012-09-21 DIAGNOSIS — K2970 Gastritis, unspecified, without bleeding: Secondary | ICD-10-CM | POA: Insufficient documentation

## 2012-09-21 DIAGNOSIS — F341 Dysthymic disorder: Secondary | ICD-10-CM | POA: Insufficient documentation

## 2012-09-21 DIAGNOSIS — Z8679 Personal history of other diseases of the circulatory system: Secondary | ICD-10-CM | POA: Insufficient documentation

## 2012-09-21 DIAGNOSIS — I498 Other specified cardiac arrhythmias: Secondary | ICD-10-CM | POA: Insufficient documentation

## 2012-09-21 DIAGNOSIS — R112 Nausea with vomiting, unspecified: Secondary | ICD-10-CM | POA: Insufficient documentation

## 2012-09-21 DIAGNOSIS — Z79899 Other long term (current) drug therapy: Secondary | ICD-10-CM | POA: Insufficient documentation

## 2012-09-21 DIAGNOSIS — Z8719 Personal history of other diseases of the digestive system: Secondary | ICD-10-CM | POA: Insufficient documentation

## 2012-09-21 MED ORDER — SUCRALFATE 1 G PO TABS
1.0000 g | ORAL_TABLET | Freq: Once | ORAL | Status: AC
  Administered 2012-09-21: 1 g via ORAL
  Filled 2012-09-21: qty 1

## 2012-09-21 MED ORDER — ACETAMINOPHEN 325 MG PO TABS
650.0000 mg | ORAL_TABLET | Freq: Once | ORAL | Status: AC
  Administered 2012-09-21: 650 mg via ORAL
  Filled 2012-09-21: qty 2

## 2012-09-21 NOTE — ED Provider Notes (Signed)
CSN: [id number]     Arrival date & time 09/21/12  1925 History  This chart was scribed for non-physician practitioner Earley Favor, FNP, working with Shanna Cisco, MD, by Yevette Edwards, ED Scribe. This patient was seen in room WTR5/WTR5 and the patient's care was started at 9:27 PM.    First MD Initiated Contact with Patient 09/21/12 2125     Chief Complaint  Patient presents with  . Headache  . Chest Pain    Patient is a 37 y.o. male presenting with chest pain. The history is provided by the patient and a parent.  Chest Pain Pain location:  Unable to specify Pain radiates to the back: no   Timing:  Constant Progression:  Improving Chronicity:  New Context: at rest   Relieved by:  None tried Ineffective treatments:  None tried Associated symptoms: headache, nausea, shortness of breath and vomiting   Associated symptoms: no anxiety, no cough, no diaphoresis, no dizziness and no weakness   Risk factors: high cholesterol, male sex and obesity    HPI Comments: Tim Nelson is a 37 y.o. Male, with a h/o GERD, who presents to the Emergency Department complaining of acute chest pain which began approximately three hours ago; he also experienced similar symptoms yesterday. He was taking his medication this evening when he felt the chest pain. Then, his head began to hurt, he felt SOB, he retched, and he saw "stars" for a few seconds. The symptoms lessened, and then his headache returned. The pt laid down to mitigate his headache, but he did not take any medication. He denies experiencing any cold symptoms such as rhinorrhea, sore throat, or ear pain. His GERD medication was changed last month, and he has experienced headaches since the medication change.   Dr. Melvia Heaps treats him. Past Medical History  Diagnosis Date  . Anxiety and depression   . Dysthymic disorder   . Esophagitis, unspecified   . Esophageal stricture   . Hemorrhoids   . Hx of colonic polyps   . Hyperlipemia    . Seasonal allergies   . GERD (gastroesophageal reflux disease)    Past Surgical History  Procedure Laterality Date  . Hernia repair    . Dental surgery     Family History  Problem Relation Age of Onset  . Colon cancer Brother 31  . Colon cancer Maternal Grandfather 50  . Esophageal cancer Maternal Grandfather   . Liver cancer Brother   . Irritable bowel syndrome Mother   . Colon polyps Mother   . Diabetes Mother    History  Substance Use Topics  . Smoking status: Never Smoker   . Smokeless tobacco: Never Used  . Alcohol Use: No    Review of Systems  Constitutional: Negative for diaphoresis.  HENT: Negative for rhinorrhea.   Respiratory: Positive for shortness of breath. Negative for cough.   Cardiovascular: Positive for chest pain.  Gastrointestinal: Positive for nausea and vomiting.  Allergic/Immunologic: Positive for environmental allergies.  Neurological: Positive for headaches. Negative for dizziness and weakness.  All other systems reviewed and are negative.    Allergies  Cefaclor and Dimetapp dm cold-cough  Home Medications   Current Outpatient Rx  Name  Route  Sig  Dispense  Refill  . acetaminophen (TYLENOL) 500 MG tablet   Oral   Take 500 mg by mouth every 6 (six) hours as needed for pain.         [name] Kitchen atorvastatin (LIPITOR) 10 MG tablet   Oral  Take 10 mg by mouth daily.          . cyclobenzaprine (FLEXERIL) 10 MG tablet   Oral   Take 1 tablet (10 mg total) by mouth 2 (two) times daily as needed for muscle spasms.   20 tablet   0   . Docusate Calcium (STOOL SOFTENER PO)   Oral   Take 1 capsule by mouth as needed.           . fenofibrate micronized (LOFIBRA) 134 MG capsule   Oral   Take 134 mg by mouth daily before breakfast.         . fexofenadine (ALLEGRA) 180 MG tablet   Oral   Take 180 mg by mouth daily.           . fluticasone (FLONASE) 50 MCG/ACT nasal spray   Nasal   Place 1 spray into the nose daily.          [name] Kitchen  HYDROcodone-acetaminophen (NORCO/VICODIN) 5-325 MG per tablet   Oral   Take 1 tablet by mouth every 4 (four) hours as needed for pain.   10 tablet   0   . omeprazole (PRILOSEC) 20 MG capsule   Oral   Take 20 mg by mouth 2 (two) times daily.         . sertraline (ZOLOFT) 50 MG tablet   Oral   Take 50 mg by mouth daily.           . simethicone (MYLICON) 125 MG chewable tablet   Oral   Chew 125 mg by mouth as needed.            Triage Vitals: BP 133/97  Pulse 74  Temp(Src) 98.4 F (36.9 C) (Oral)  Resp 21  SpO2 99% Physical Exam  Nursing note and vitals reviewed. Constitutional: He is oriented to person, place, and time. He appears well-developed and well-nourished. No distress.  HENT:  Head: Normocephalic and atraumatic.  Right Ear: External ear normal.  Left Ear: External ear normal.  No exudate.  Eyes: EOM are normal. Pupils are equal, round, and reactive to light.  Neck: Neck supple. No tracheal deviation present.  No cervical adenopathy.   Cardiovascular: Normal rate.   Heart rate is slow and even.  Pulmonary/Chest: Effort normal and breath sounds normal. No respiratory distress.  Abdominal: Soft. Bowel sounds are normal. He exhibits no distension. There is no tenderness.  Musculoskeletal: Normal range of motion.  Lymphadenopathy:    He has no cervical adenopathy.  Neurological: He is alert and oriented to person, place, and time.  Skin: Skin is warm and dry.  Psychiatric: He has a normal mood and affect. His behavior is normal.    ED Course  DIAGNOSTIC STUDIES:  Oxygen Saturation is 99% on room air, normal by my interpretation.    COORDINATION OF CARE:  9:31 PM- Discussed treatment plan with patient, and the patient agreed to the plan.    10:27 PM- Rechecked pt. He was feeling improved.    Procedures (including critical care time)  Labs Reviewed - No data to display No results found. 1. Headache   2. Gastritis     MDM   Patient given  Tylenol and Carafate and is back to normal     I personally performed the services described in this documentation, which was scribed in my presence. The recorded information has been reviewed and is accurate.   Arman Filter, NP 09/21/12 2232

## 2012-09-21 NOTE — ED Notes (Signed)
Pt states today around 1740 he started having chest pain describes as a heavy pressure felt like his chest was going to burst wide open  Pt states associated with the pain was headache, dizziness, shortness of breath, vomiting x 1, and blurred vision

## 2012-09-22 MED ORDER — HYDROMORPHONE HCL PF 1 MG/ML IJ SOLN
INTRAMUSCULAR | Status: AC
  Filled 2012-09-22: qty 1

## 2012-09-22 NOTE — ED Provider Notes (Signed)
Medical screening examination/treatment/procedure(s) were performed by non-physician practitioner and as supervising physician I was immediately available for consultation/collaboration.   Megan E Docherty, MD 09/22/12 1051 

## 2012-11-05 ENCOUNTER — Telehealth: Payer: Self-pay | Admitting: Gastroenterology

## 2012-11-05 MED ORDER — DEXLANSOPRAZOLE 60 MG PO CPDR: 60.0000 mg | DELAYED_RELEASE_CAPSULE | Freq: Every day | ORAL | Status: DC

## 2012-11-05 NOTE — Telephone Encounter (Signed)
Pt aware, OV scheduled and script sent to the pharmacy.

## 2012-11-05 NOTE — Telephone Encounter (Signed)
Can try dexilant 60 mg daily.  Patient should have a followup office visit

## 2012-11-05 NOTE — Telephone Encounter (Signed)
Pts mother states pt is having problems with acid reflux. States pt is coughing a lot and having epigastric pain. States the prilosec is no longer working for him. Want to know if he can have something "stronger" to take. Please advise.

## 2012-11-29 ENCOUNTER — Ambulatory Visit: Payer: Medicare Other | Admitting: Gastroenterology

## 2012-12-29 ENCOUNTER — Ambulatory Visit: Payer: Medicare Other | Admitting: Gastroenterology

## 2013-02-11 ENCOUNTER — Encounter: Payer: Self-pay | Admitting: Gastroenterology

## 2013-02-11 ENCOUNTER — Ambulatory Visit (INDEPENDENT_AMBULATORY_CARE_PROVIDER_SITE_OTHER): Payer: Medicare Other | Admitting: Gastroenterology

## 2013-02-11 VITALS — BP 132/80 | HR 68 | Ht 69.0 in | Wt 208.9 lb

## 2013-02-11 DIAGNOSIS — Z800 Family history of malignant neoplasm of digestive organs: Secondary | ICD-10-CM

## 2013-02-11 DIAGNOSIS — R1319 Other dysphagia: Secondary | ICD-10-CM

## 2013-02-11 DIAGNOSIS — R059 Cough, unspecified: Secondary | ICD-10-CM

## 2013-02-11 DIAGNOSIS — R05 Cough: Secondary | ICD-10-CM

## 2013-02-11 NOTE — Assessment & Plan Note (Signed)
Continue colonoscopy every 5 years

## 2013-02-11 NOTE — Patient Instructions (Addendum)
You have been scheduled for a Barium Esophogram at [facility] (1st floor of the hospital) on 02/15/2013 at 10:30am. Please arrive 15 minutes prior to your appointment for registration. Make certain not to have anything to eat or drink 6 hours prior to your test. If you need to reschedule for any reason, please contact radiology at [phone] to do so. __________________________________________________________________ A barium swallow is an examination that concentrates on views of the esophagus. This tends to be a double contrast exam (barium and two liquids which, when combined, create a gas to distend the wall of the oesophagus) or single contrast (non-ionic iodine based). The study is usually tailored to your symptoms so a good history is essential. Attention is paid during the study to the form, structure and configuration of the esophagus, looking for functional disorders (such as aspiration, dysphagia, achalasia, motility and reflux) EXAMINATION You may be asked to change into a gown, depending on the type of swallow being performed. A radiologist and radiographer will perform the procedure. The radiologist will advise you of the type of contrast selected for your procedure and direct you during the exam. You will be asked to stand, sit or lie in several different positions and to hold a small amount of fluid in your mouth before being asked to swallow while the imaging is performed .In some instances you may be asked to swallow barium coated marshmallows to assess the motility of a solid food bolus. The exam can be recorded as a digital or video fluoroscopy procedure. POST PROCEDURE It will take 1-2 days for the barium to pass through your system. To facilitate this, it is important, unless otherwise directed, to increase your fluids for the next 24-48hrs and to resume your normal diet.  This test typically takes about 30 minutes to  perform. __________________________________________________________________________________

## 2013-02-11 NOTE — Assessment & Plan Note (Signed)
Chronic nonproductive cough may be do to esophageal reflux and silent aspiration.  He could have a motility disorder of the esophagus or recurrent stricture.  Cyclical coughing or coughing as a manifestation of chronic anxiety are other possibilities.  Recommendations #1 barium swallow #2 if no abnormalities are seen by the x-ray I will proceed with a bravo 48-hour pH study with the patient on medications #3 should above workup is negative I will refer him to pulmonary for further evaluation

## 2013-02-11 NOTE — Progress Notes (Signed)
          History of Present Illness:  Pleasant 38 year old white male with history of an esophageal stricture, family history of colon cancer, history of adenomas polyp, referred at the request of Dr. Edrick Oh for evaluation of chronic cough.  For the past 3 months he's been suffering from a chronic nonproductive cough.  This may occur anytime during the day or night.  He has occasional pyrosis.  After increasing dexilant to twice a day symptoms did not improve.  It's unclear whether he is having dysphagia to solids.  He feels like food just stays in his lower chest or upper abdomen.  He also has occasional chest discomfort.  He denies odoynoophagia.  Prior gastric emptying scan was normal.  He is on no gastric irritants.    Review of Systems: Pertinent positive and negative review of systems were noted in the above HPI section. All other review of systems were otherwise negative.    Current Medications, Allergies, Past Medical History, Past Surgical History, Family History and Social History were reviewed in [place] record  Vital signs were reviewed in today's medical record. Physical Exam: General: Well developed , well nourished, no acute distress Skin: anicteric Head: Normocephalic and atraumatic Eyes:  sclerae anicteric, EOMI Ears: Normal auditory acuity Mouth: No deformity or lesions Lungs: Clear throughout to auscultation Heart: Regular rate and rhythm; no murmurs, rubs or bruits Abdomen: Soft, non tender and non distended. No masses, hepatosplenomegaly or hernias noted. Normal Bowel sounds.  There is no succussion splash Rectal:deferred Musculoskeletal: Symmetrical with no gross deformities  Pulses:  Normal pulses noted Extremities: No clubbing, cyanosis, edema or deformities noted Neurological: Alert oriented x 4, grossly nonfocal Psychological:  Alert and cooperative. Normal mood and affect  See Assessment and Plan under Problem List

## 2013-02-15 ENCOUNTER — Ambulatory Visit (HOSPITAL_COMMUNITY)
Admission: RE | Admit: 2013-02-15 | Discharge: 2013-02-15 | Disposition: A | Discharge status: Home / Self Care | Payer: Medicare Other | Source: Ambulatory Visit | Attending: Gastroenterology | Admitting: Gastroenterology

## 2013-02-15 DIAGNOSIS — K219 Gastro-esophageal reflux disease without esophagitis: Secondary | ICD-10-CM | POA: Insufficient documentation

## 2013-02-15 DIAGNOSIS — R1310 Dysphagia, unspecified: Secondary | ICD-10-CM | POA: Insufficient documentation

## 2013-02-15 DIAGNOSIS — K228 Other specified diseases of esophagus: Secondary | ICD-10-CM | POA: Insufficient documentation

## 2013-02-15 DIAGNOSIS — K224 Dyskinesia of esophagus: Secondary | ICD-10-CM | POA: Insufficient documentation

## 2013-02-15 DIAGNOSIS — K2289 Other specified disease of esophagus: Secondary | ICD-10-CM | POA: Insufficient documentation

## 2013-02-15 DIAGNOSIS — R1319 Other dysphagia: Secondary | ICD-10-CM

## 2013-02-16 ENCOUNTER — Encounter (HOSPITAL_COMMUNITY): Payer: Self-pay | Admitting: Pharmacy Technician

## 2013-02-16 ENCOUNTER — Other Ambulatory Visit: Payer: Self-pay

## 2013-02-16 DIAGNOSIS — K224 Dyskinesia of esophagus: Secondary | ICD-10-CM

## 2013-02-17 ENCOUNTER — Encounter (HOSPITAL_COMMUNITY): Payer: Self-pay | Admitting: *Deleted

## 2013-02-22 ENCOUNTER — Telehealth: Payer: Self-pay | Admitting: Gastroenterology

## 2013-02-23 NOTE — Telephone Encounter (Signed)
Spoke with patient and clarified instructions.

## 2013-02-23 NOTE — Telephone Encounter (Signed)
Left a message for patient to call me. 

## 2013-03-11 ENCOUNTER — Encounter (HOSPITAL_COMMUNITY): Payer: Self-pay | Admitting: *Deleted

## 2013-03-28 ENCOUNTER — Encounter (HOSPITAL_COMMUNITY): Payer: Medicare Other | Admitting: Certified Registered Nurse Anesthetist

## 2013-03-28 ENCOUNTER — Encounter (HOSPITAL_COMMUNITY): Payer: Self-pay | Admitting: Certified Registered Nurse Anesthetist

## 2013-03-28 ENCOUNTER — Encounter (HOSPITAL_COMMUNITY)
Admission: RE | Disposition: A | Discharge status: Home / Self Care | Payer: Self-pay | Source: Ambulatory Visit | Attending: Gastroenterology

## 2013-03-28 ENCOUNTER — Ambulatory Visit (HOSPITAL_COMMUNITY)
Admission: RE | Admit: 2013-03-28 | Discharge: 2013-03-28 | Disposition: A | Discharge status: Home / Self Care | Payer: Medicare Other | Source: Ambulatory Visit | Attending: Gastroenterology | Admitting: Gastroenterology

## 2013-03-28 ENCOUNTER — Ambulatory Visit (HOSPITAL_COMMUNITY): Payer: Medicare Other | Admitting: Certified Registered Nurse Anesthetist

## 2013-03-28 DIAGNOSIS — K219 Gastro-esophageal reflux disease without esophagitis: Secondary | ICD-10-CM

## 2013-03-28 DIAGNOSIS — F411 Generalized anxiety disorder: Secondary | ICD-10-CM | POA: Insufficient documentation

## 2013-03-28 DIAGNOSIS — Z79899 Other long term (current) drug therapy: Secondary | ICD-10-CM | POA: Insufficient documentation

## 2013-03-28 DIAGNOSIS — R12 Heartburn: Secondary | ICD-10-CM | POA: Insufficient documentation

## 2013-03-28 DIAGNOSIS — K224 Dyskinesia of esophagus: Secondary | ICD-10-CM

## 2013-03-28 DIAGNOSIS — R0789 Other chest pain: Secondary | ICD-10-CM | POA: Insufficient documentation

## 2013-03-28 DIAGNOSIS — R05 Cough: Secondary | ICD-10-CM

## 2013-03-28 DIAGNOSIS — R059 Cough, unspecified: Secondary | ICD-10-CM | POA: Insufficient documentation

## 2013-03-28 DIAGNOSIS — Z800 Family history of malignant neoplasm of digestive organs: Secondary | ICD-10-CM | POA: Insufficient documentation

## 2013-03-28 HISTORY — PX: BRAVO PH STUDY: SHX5421

## 2013-03-28 HISTORY — PX: ESOPHAGOGASTRODUODENOSCOPY: SHX5428

## 2013-03-28 HISTORY — DX: Personal history of other specified conditions: Z87.898

## 2013-03-28 HISTORY — DX: Personal history of other diseases of the respiratory system: Z87.09

## 2013-03-28 SURGERY — EGD (ESOPHAGOGASTRODUODENOSCOPY)
Anesthesia: Monitor Anesthesia Care

## 2013-03-28 MED ORDER — PROMETHAZINE HCL 25 MG/ML IJ SOLN: 6.2500 mg | INTRAMUSCULAR | Status: DC | PRN

## 2013-03-28 MED ORDER — PROPOFOL INFUSION 10 MG/ML OPTIME
INTRAVENOUS | Status: DC | PRN
  Administered 2013-03-28: 200 ug/kg/min via INTRAVENOUS

## 2013-03-28 MED ORDER — LACTATED RINGERS IV SOLN
INTRAVENOUS | Status: DC
  Administered 2013-03-28: 1000 mL via INTRAVENOUS

## 2013-03-28 MED ORDER — PROPOFOL 10 MG/ML IV BOLUS
INTRAVENOUS | Status: AC
  Filled 2013-03-28: qty 20

## 2013-03-28 MED ORDER — PROPOFOL 10 MG/ML IV BOLUS
INTRAVENOUS | Status: DC | PRN
  Administered 2013-03-28: 100 mg via INTRAVENOUS

## 2013-03-28 MED ORDER — LACTATED RINGERS IV SOLN
INTRAVENOUS | Status: DC | PRN
  Administered 2013-03-28: 12:00:00 via INTRAVENOUS

## 2013-03-28 MED ORDER — DEXLANSOPRAZOLE 60 MG PO CPDR: DELAYED_RELEASE_CAPSULE | ORAL | Status: DC

## 2013-03-28 MED ORDER — SODIUM CHLORIDE 0.9 % IV SOLN: INTRAVENOUS | Status: DC

## 2013-03-28 NOTE — Anesthesia Preprocedure Evaluation (Signed)
Anesthesia Evaluation  Patient identified by MRN, date of birth, ID band Patient awake    Reviewed: Allergy & Precautions, H&P , NPO status , Patient's Chart, lab work & pertinent test results  Airway Mallampati: II TM Distance: >3 FB Neck ROM: Full    Dental no notable dental hx.    Pulmonary neg pulmonary ROS,  breath sounds clear to auscultation  Pulmonary exam normal       Cardiovascular negative cardio ROS  Rhythm:Regular Rate:Normal     Neuro/Psych Anxiety negative neurological ROS     GI/Hepatic negative GI ROS, Neg liver ROS,   Endo/Other  negative endocrine ROS  Renal/GU negative Renal ROS  negative genitourinary   Musculoskeletal negative musculoskeletal ROS (+)   Abdominal   Peds negative pediatric ROS (+)  Hematology negative hematology ROS (+)   Anesthesia Other Findings   Reproductive/Obstetrics negative OB ROS                           Anesthesia Physical Anesthesia Plan  ASA: II  Anesthesia Plan: MAC   Post-op Pain Management:    Induction: Intravenous  Airway Management Planned: Nasal Cannula  Additional Equipment:   Intra-op Plan:   Post-operative Plan:   Informed Consent: I have reviewed the patients History and Physical, chart, labs and discussed the procedure including the risks, benefits and alternatives for the proposed anesthesia with the patient or authorized representative who has indicated his/her understanding and acceptance.   Dental advisory given  Plan Discussed with: CRNA and Surgeon  Anesthesia Plan Comments:         Anesthesia Quick Evaluation

## 2013-03-28 NOTE — Op Note (Signed)
[facility] [place], [address]   ENDOSCOPY PROCEDURE REPORT  PATIENT: Tim Nelson, Tim Nelson  MR#: [id number] BIRTHDATE: 04-Mar-1975 , 37  yrs. old GENDER: Male ENDOSCOPIST: Inda Castle, MD REFERRED BY: PROCEDURE DATE:  03/28/2013 PROCEDURE:  EGD w/ Bravo capsule placement ASA CLASS:     Class II INDICATIONS:  cough MEDICATIONS: MAC sedation, administered by CRNA TOPICAL ANESTHETIC:  DESCRIPTION OF PROCEDURE: After the risks benefits and alternatives of the procedure were thoroughly explained, informed consent was obtained.  The    endoscope was introduced through the mouth and advanced to the third portion of the duodenum. Without limitations. The instrument was slowly withdrawn as the mucosa was fully examined.      The upper, middle and distal third of the esophagus were carefully inspected and no abnormalities were noted.  The z-line was well seen at the GEJ.  The endoscope was pushed into the fundus which was normal including a retroflexed view.  The antrum, gastric body, first and second part of the duodenum were unremarkable. Retroflexed views revealed no abnormalities.   The GE junction was identified at 43 cm from the incisors.  The scope was then withdrawn from the patient and the procedure completed. a bravo pH probe was placed at 36 cm from the incisors.  COMPLICATIONS: There were no complications. ENDOSCOPIC IMPRESSION: normal endoscopy status post bravo pH placement  RECOMMENDATIONS: Hold PPI therapy for the next 48 hours REPEAT EXAM:  eSigned:  Inda Castle, MD 03/28/2013 12:57 PM   CC:

## 2013-03-28 NOTE — H&P (Signed)
  History of Present Illness: Pleasant 38 year old white male with history of an esophageal stricture, family history of colon cancer, history of adenomas polyp, referred at the request of Dr. Edrick Oh for evaluation of chronic cough. For the past 3 months he's been suffering from a chronic nonproductive cough. This may occur anytime during the day or night. He has occasional pyrosis. After increasing dexilant to twice a day symptoms did not improve. It's unclear whether he is having dysphagia to solids. He feels like food just stays in his lower chest or upper abdomen. He also has occasional chest discomfort. He denies odoynoophagia. Prior gastric emptying scan was normal. He is on no gastric irritants.  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  There were no strictures.  Review of Systems: Pertinent positive and negative review of systems were noted in the above HPI section. All other review of systems were otherwise negative.  Current Medications, Allergies, Past Medical History, Past Surgical History, Family History and Social History were reviewed in [place] record  Vital signs were reviewed in today's medical record.  Physical Exam:  General: Well developed , well nourished, no acute distress  Skin: anicteric  Head: Normocephalic and atraumatic  Eyes: sclerae anicteric, EOMI  Ears: Normal auditory acuity  Mouth: No deformity or lesions  Lungs: Clear throughout to auscultation  Heart: Regular rate and rhythm; no murmurs, rubs or bruits  Abdomen: Soft, non tender and non distended. No masses, hepatosplenomegaly or hernias noted. Normal Bowel sounds. There is no succussion splash  Rectal:deferred  Musculoskeletal: Symmetrical with no gross deformities  Pulses: Normal pulses noted  Extremities: No clubbing, cyanosis, edema or deformities noted  Neurological: Alert oriented x 4, grossly nonfocal  Psychological: Alert and cooperative. Normal mood and  affect  See Assessment and Plan under Problem List       Cough - Inda Castle, MD at 02/11/2013 11:24 AM     Status: Written Related Problem: Cough    Chronic nonproductive cough may be do to esophageal reflux and silent aspiration. He could have a motility disorder of the esophagus . Cyclical coughing or coughing as a manifestation of chronic anxiety are other possibilities.  Plan EGD with bravo pH study

## 2013-03-28 NOTE — Anesthesia Postprocedure Evaluation (Signed)
  Anesthesia Post-op Note  Patient: Tim Nelson  Procedure(s) Performed: Procedure(s) (LRB): ESOPHAGOGASTRODUODENOSCOPY (EGD) (N/A) BRAVO PH STUDY (N/A)  Patient Location: PACU  Anesthesia Type: MAC  Level of Consciousness: awake and alert   Airway and Oxygen Therapy: Patient Spontanous Breathing  Post-op Pain: mild  Post-op Assessment: Post-op Vital signs reviewed, Patient's Cardiovascular Status Stable, Respiratory Function Stable, Patent Airway and No signs of Nausea or vomiting  Last Vitals:  Filed Vitals:   03/28/13 1302  BP: 103/60  Temp: 36.6 C  Resp: 18    Post-op Vital Signs: stable   Complications: No apparent anesthesia complications

## 2013-03-28 NOTE — Transfer of Care (Signed)
Immediate Anesthesia Transfer of Care Note  Patient: Tim Nelson  Procedure(s) Performed: Procedure(s): ESOPHAGOGASTRODUODENOSCOPY (EGD) (N/A) BRAVO PH STUDY (N/A)  Patient Location: PACU and Endoscopy Unit  Anesthesia Type:MAC  Level of Consciousness: awake and alert   Airway & Oxygen Therapy: Patient Spontanous Breathing and Patient connected to nasal cannula oxygen  Post-op Assessment: Report given to PACU RN and Post -op Vital signs reviewed and stable  Post vital signs: Reviewed and stable  Complications: No apparent anesthesia complications

## 2013-03-28 NOTE — Discharge Instructions (Signed)
Do not take dexilant for 48 hours

## 2013-03-29 ENCOUNTER — Encounter (HOSPITAL_COMMUNITY): Payer: Self-pay | Admitting: Gastroenterology

## 2013-04-13 ENCOUNTER — Telehealth: Payer: Self-pay | Admitting: Gastroenterology

## 2013-04-13 NOTE — Telephone Encounter (Signed)
Calling for esophageal mano results. Please advise.

## 2013-04-14 NOTE — Telephone Encounter (Signed)
Please locate the report for me.

## 2013-04-14 NOTE — Telephone Encounter (Signed)
I see the manometry report for Mr. Tim Nelson not Mr. Rolfson.

## 2013-04-14 NOTE — Telephone Encounter (Signed)
Dr. Hilarie Fredrickson sent an email out on 04/08/13, I just forwarded mine to you with the report.

## 2013-04-15 NOTE — Telephone Encounter (Signed)
Dr. Deatra Ina the pt had an EGD with Bravo, they are calling for those results done on 03/28/13.

## 2013-04-18 NOTE — Telephone Encounter (Signed)
I am unable to find the actual study.  Please contact Endo

## 2013-04-19 ENCOUNTER — Encounter (AMBULATORY_SURGERY_CENTER): Payer: Medicare Other | Admitting: Gastroenterology

## 2013-04-19 NOTE — Telephone Encounter (Signed)
Per Dr. Deatra Ina:  Please inform the patient that the pH study demonstrated that he has significant acid reflux. We need to do an esophageal manometry because his prior x-ray showed that the esophagus is not contracting normally

## 2013-04-20 ENCOUNTER — Telehealth: Payer: Self-pay | Admitting: Gastroenterology

## 2013-04-20 MED ORDER — DEXLANSOPRAZOLE 60 MG PO CPDR: 60.0000 mg | DELAYED_RELEASE_CAPSULE | Freq: Every day | ORAL | Status: DC

## 2013-04-20 NOTE — Telephone Encounter (Signed)
Pt scheduled for esophageal manometry at [facility] 04/25/13@9 :30am, pt to arrive there at 9am and be NPO after midnight. Instructions mailed to pt.

## 2013-04-20 NOTE — Telephone Encounter (Signed)
Patient needed refill on his dexilant spoke with POA mother  RX sent

## 2013-04-25 ENCOUNTER — Encounter (HOSPITAL_COMMUNITY)
Admission: RE | Disposition: A | Discharge status: Home / Self Care | Payer: Self-pay | Source: Ambulatory Visit | Attending: Gastroenterology

## 2013-04-25 ENCOUNTER — Ambulatory Visit (HOSPITAL_COMMUNITY)
Admission: RE | Admit: 2013-04-25 | Discharge: 2013-04-25 | Disposition: A | Discharge status: Home / Self Care | Payer: Medicare Other | Source: Ambulatory Visit | Attending: Gastroenterology | Admitting: Gastroenterology

## 2013-04-25 ENCOUNTER — Encounter: Payer: Self-pay | Admitting: Gastroenterology

## 2013-04-25 DIAGNOSIS — R1310 Dysphagia, unspecified: Secondary | ICD-10-CM

## 2013-04-25 DIAGNOSIS — R05 Cough: Secondary | ICD-10-CM | POA: Insufficient documentation

## 2013-04-25 DIAGNOSIS — R059 Cough, unspecified: Secondary | ICD-10-CM | POA: Insufficient documentation

## 2013-04-25 DIAGNOSIS — K219 Gastro-esophageal reflux disease without esophagitis: Secondary | ICD-10-CM | POA: Insufficient documentation

## 2013-04-25 HISTORY — PX: ESOPHAGEAL MANOMETRY: SHX5429

## 2013-04-25 SURGERY — MANOMETRY, ESOPHAGUS

## 2013-04-25 MED ORDER — LIDOCAINE VISCOUS 2 % MT SOLN
OROMUCOSAL | Status: AC
  Filled 2013-04-25: qty 15

## 2013-04-25 SURGICAL SUPPLY — 1 items: FACESHIELD LNG OPTICON STERILE (SAFETY) IMPLANT

## 2013-04-26 ENCOUNTER — Encounter (HOSPITAL_COMMUNITY): Payer: Self-pay | Admitting: Gastroenterology

## 2013-05-05 ENCOUNTER — Encounter: Payer: Self-pay | Admitting: Gastroenterology

## 2013-05-05 NOTE — Progress Notes (Signed)
Patient ID: SI JACHIM, male   DOB: 01/27/1976, 38 y.o.   MRN: [id number]  Esophageal manometry is normal.  Plan on talking with the patient and his family regarding antireflux surgery and other measures.

## 2013-05-09 ENCOUNTER — Telehealth: Payer: Self-pay

## 2013-05-09 ENCOUNTER — Telehealth: Payer: Self-pay | Admitting: Gastroenterology

## 2013-05-09 NOTE — Telephone Encounter (Signed)
Pt scheduled for OV 06/10/13@9 :30am. Letter mailed to pt.

## 2013-05-09 NOTE — Telephone Encounter (Signed)
Message copied by Algernon Huxley on Mon May 09, 2013  9:55 AM ------      Message from: Erskine Emery D      Created: Thu May 05, 2013  4:49 PM       Patient needs elective office visit ------

## 2013-05-09 NOTE — Telephone Encounter (Signed)
Pt calling for mano results. Please advise.

## 2013-06-10 ENCOUNTER — Ambulatory Visit (INDEPENDENT_AMBULATORY_CARE_PROVIDER_SITE_OTHER): Payer: Medicare Other | Admitting: Gastroenterology

## 2013-06-10 ENCOUNTER — Encounter: Payer: Self-pay | Admitting: Gastroenterology

## 2013-06-10 VITALS — BP 112/70 | HR 79 | Ht 69.0 in | Wt 199.4 lb

## 2013-06-10 DIAGNOSIS — K209 Esophagitis, unspecified without bleeding: Secondary | ICD-10-CM

## 2013-06-10 DIAGNOSIS — K219 Gastro-esophageal reflux disease without esophagitis: Secondary | ICD-10-CM

## 2013-06-10 NOTE — Progress Notes (Signed)
          History of Present Illness:  Ms. Bolds has returned following esophageal manometry and 48 hour bravo pH testing.  Soft manometry was normal.  Off PPI therapy pH study demonstrated significant acid reflux.  Despite taking dexilant twice a day he remains symptomatic with frequent regurgitation of gastric contents.  He has burning chest discomfort and sleeps with his head elevated.  Various combinations of PPI have been unsuccessful.  He no longer has dysphagia.    Review of Systems: Pertinent positive and negative review of systems were noted in the above HPI section. All other review of systems were otherwise negative.    Current Medications, Allergies, Past Medical History, Past Surgical History, Family History and Social History were reviewed in [place] record  Vital signs were reviewed in today's medical record. Physical Exam: General: Well developed , well nourished, no acute distress   See Assessment and Plan under Problem List

## 2013-06-10 NOTE — Assessment & Plan Note (Signed)
Patient remains symptomatic characterized by chest discomfort, pyrosis, and chronic cough despite medical therapy.  At this point, barring a significant response to PPI therapy combinations, I would consider antireflux surgery.    Recommendations #1 to q. a.m. dexilant will add Zegerid each bedtime; if not improved I will refer to Gen. surgery for consideration of fundoplication

## 2013-06-10 NOTE — Patient Instructions (Signed)
Zegerid Samples Use 1 Zegerid everynight at bedtime Call back in 2 weeks to report your progress

## 2014-05-10 ENCOUNTER — Emergency Department (HOSPITAL_COMMUNITY): Payer: Medicare Other

## 2014-05-10 ENCOUNTER — Emergency Department (HOSPITAL_COMMUNITY)
Admission: EM | Admit: 2014-05-10 | Discharge: 2014-05-10 | Disposition: A | Discharge status: Home / Self Care | Payer: Medicare Other | Attending: Emergency Medicine | Admitting: Emergency Medicine

## 2014-05-10 ENCOUNTER — Encounter (HOSPITAL_COMMUNITY): Payer: Self-pay | Admitting: *Deleted

## 2014-05-10 DIAGNOSIS — Z86010 Personal history of colonic polyps: Secondary | ICD-10-CM | POA: Insufficient documentation

## 2014-05-10 DIAGNOSIS — F419 Anxiety disorder, unspecified: Secondary | ICD-10-CM | POA: Insufficient documentation

## 2014-05-10 DIAGNOSIS — N508 Other specified disorders of male genital organs: Secondary | ICD-10-CM | POA: Insufficient documentation

## 2014-05-10 DIAGNOSIS — E785 Hyperlipidemia, unspecified: Secondary | ICD-10-CM | POA: Insufficient documentation

## 2014-05-10 DIAGNOSIS — Z7951 Long term (current) use of inhaled steroids: Secondary | ICD-10-CM | POA: Insufficient documentation

## 2014-05-10 DIAGNOSIS — F329 Major depressive disorder, single episode, unspecified: Secondary | ICD-10-CM | POA: Insufficient documentation

## 2014-05-10 DIAGNOSIS — N5089 Other specified disorders of the male genital organs: Secondary | ICD-10-CM

## 2014-05-10 DIAGNOSIS — Z79899 Other long term (current) drug therapy: Secondary | ICD-10-CM | POA: Insufficient documentation

## 2014-05-10 DIAGNOSIS — K219 Gastro-esophageal reflux disease without esophagitis: Secondary | ICD-10-CM | POA: Diagnosis not present

## 2014-05-10 DIAGNOSIS — R220 Localized swelling, mass and lump, head: Secondary | ICD-10-CM | POA: Diagnosis present

## 2014-05-10 LAB — COMPREHENSIVE METABOLIC PANEL
ALT: 53 U/L (ref 0–53)
AST: 43 U/L — ABNORMAL HIGH (ref 0–37)
Albumin: 4 g/dL (ref 3.5–5.2)
Alkaline Phosphatase: 59 U/L (ref 39–117)
Anion gap: 4 — ABNORMAL LOW (ref 5–15)
BUN: 24 mg/dL — ABNORMAL HIGH (ref 6–23)
CO2: 29 mmol/L (ref 19–32)
Calcium: 9.7 mg/dL (ref 8.4–10.5)
Chloride: 107 mmol/L (ref 96–112)
Creatinine, Ser: 1.17 mg/dL (ref 0.50–1.35)
GFR calc Af Amer: >90 mL/min (ref >90)
GFR calc non Af Amer: 79 mL/min — ABNORMAL LOW (ref >90)
Glucose, Bld: 112 mg/dL — ABNORMAL HIGH (ref 70–99)
Potassium: 4 mmol/L (ref 3.5–5.1)
Sodium: 140 mmol/L (ref 135–145)
Total Bilirubin: 0.5 mg/dL (ref 0.3–1.2)
Total Protein: 6.8 g/dL (ref 6.0–8.3)

## 2014-05-10 LAB — URINALYSIS, ROUTINE W REFLEX MICROSCOPIC
Bilirubin Urine: NEGATIVE
Glucose, UA: NEGATIVE mg/dL
Hgb urine dipstick: NEGATIVE
Ketones, ur: NEGATIVE mg/dL
Leukocytes, UA: NEGATIVE
Nitrite: NEGATIVE
Protein, ur: NEGATIVE mg/dL
Specific Gravity, Urine: 1.027 (ref 1.005–1.030)
Urobilinogen, UA: 1 mg/dL (ref 0.0–1.0)
pH: 6 (ref 5.0–8.0)

## 2014-05-10 LAB — CBC WITH DIFFERENTIAL/PLATELET
Basophils Absolute: 0 10*3/uL (ref 0.0–0.1)
Basophils Relative: 0 % (ref 0–1)
Eosinophils Absolute: 0.2 10*3/uL (ref 0.0–0.7)
Eosinophils Relative: 3 % (ref 0–5)
HCT: 41.7 % (ref 39.0–52.0)
Hemoglobin: 14.4 g/dL (ref 13.0–17.0)
Lymphocytes Relative: 34 % (ref 12–46)
Lymphs Abs: 2.6 10*3/uL (ref 0.7–4.0)
MCH: 28.9 pg (ref 26.0–34.0)
MCHC: 34.5 g/dL (ref 30.0–36.0)
MCV: 83.7 fL (ref 78.0–100.0)
Monocytes Absolute: 0.6 10*3/uL (ref 0.1–1.0)
Monocytes Relative: 8 % (ref 3–12)
Neutro Abs: 4.2 10*3/uL (ref 1.7–7.7)
Neutrophils Relative %: 55 % (ref 43–77)
Platelets: 304 10*3/uL (ref 150–400)
RBC: 4.98 MIL/uL (ref 4.22–5.81)
RDW: 12.8 % (ref 11.5–15.5)
WBC: 7.6 10*3/uL (ref 4.0–10.5)

## 2014-05-10 LAB — LIPASE, BLOOD: Lipase: 37 U/L (ref 11–59)

## 2014-05-10 MED ORDER — ACETAMINOPHEN 325 MG PO TABS
650.0000 mg | ORAL_TABLET | Freq: Once | ORAL | Status: AC
  Administered 2014-05-10: 650 mg via ORAL
  Filled 2014-05-10: qty 2

## 2014-05-10 NOTE — ED Notes (Signed)
The pt is c/o pain in his rt testicle since august 2015.  He has been seen by doctors for these complaints.  He also has a blister that appears on his penis intermittently.  Worse for the past 2 months .  Difficulty urinating for the past 2 days

## 2014-05-10 NOTE — ED Provider Notes (Signed)
CSN: [id number]     Arrival date & time 05/10/14  1846 History   First MD Initiated Contact with Patient 05/10/14 1908     Chief Complaint  Patient presents with  . Groin Swelling     (Consider location/radiation/quality/duration/timing/severity/associated sxs/prior Treatment) HPI   39 year old male who is presenting with greater than 6 months of intermittent groin swelling which is being followed by urology as an outpatient. He has apparently been through multiple courses of antibiotics.   According to the mom, he has also been told that there was a cyst in his scrotum. They're coming in today because of continued swelling which has gotten worse over last few days, no fevers, its associated pain in the groin she says at times his scrotum appears black, or red and then the swelling will improve.  This waxing and waning of the skin findings has been going on for months. Denies any dysuria.  Past Medical History  Diagnosis Date  . Anxiety and depression   . Dysthymic disorder   . Esophagitis, unspecified   . Esophageal stricture   . Hx of colonic polyps   . Hyperlipemia   . Seasonal allergies   . GERD (gastroesophageal reflux disease)   . History of chronic cough     dry cough-sleeps with elevated head of bed.   Past Surgical History  Procedure Laterality Date  . Dental surgery    . Colonscopy  2014    hx. polyps with past testing  . Hernia repair  AS INFANT    groin  . Esophagogastroduodenoscopy N/A 03/28/2013    Procedure: ESOPHAGOGASTRODUODENOSCOPY (EGD);  Surgeon: Inda Castle, MD;  Location: [facility];  Service: Endoscopy;  Laterality: N/A;  . Bravo ph study N/A 03/28/2013    Procedure: BRAVO Mark STUDY;  Surgeon: Inda Castle, MD;  Location: [facility];  Service: Endoscopy;  Laterality: N/A;  . Esophageal manometry N/A 04/25/2013    Procedure: ESOPHAGEAL MANOMETRY (EM);  Surgeon: Inda Castle, MD;  Location: [facility];  Service: Endoscopy;  Laterality: N/A;    Family History  Problem Relation Age of Onset  . Colon cancer Brother 73  . Colon cancer Maternal Grandfather 65  . Esophageal cancer Maternal Grandfather   . Liver cancer Brother   . Irritable bowel syndrome Mother   . Colon polyps Mother   . Diabetes Mother    History  Substance Use Topics  . Smoking status: Never Smoker   . Smokeless tobacco: Never Used  . Alcohol Use: No    Review of Systems  Constitutional: Negative for fever and chills.  Eyes: Negative for redness.  Respiratory: Negative for cough and shortness of breath.   Cardiovascular: Negative for chest pain.  Gastrointestinal: Negative for nausea, vomiting, abdominal pain and diarrhea.  Genitourinary: Positive for scrotal swelling. Negative for dysuria and discharge.  Skin: Negative for rash.  Neurological: Negative for headaches.  All other systems reviewed and are negative.     Allergies  Cefaclor and Dimetapp dm cold-cough  Home Medications   Prior to Admission medications   Medication Sig Start Date End Date Taking? Authorizing Provider  acetaminophen (TYLENOL) 500 MG tablet Take 500 mg by mouth every 6 (six) hours as needed for mild pain or moderate pain.    Yes Historical Provider, MD  ARIPiprazole (ABILIFY) 5 MG tablet Take 5 mg by mouth daily.   Yes Historical Provider, MD  atorvastatin (LIPITOR) 10 MG tablet Take 10 mg by mouth every evening.    Yes  Historical Provider, MD  dexlansoprazole (DEXILANT) 60 MG capsule Take 1 capsule (60 mg total) by mouth daily. 04/20/13  Yes Inda Castle, MD  fenofibrate micronized (LOFIBRA) 134 MG capsule Take 134 mg by mouth daily before breakfast.   Yes Historical Provider, MD  fexofenadine (ALLEGRA) 180 MG tablet Take 180 mg by mouth every morning.    Yes Historical Provider, MD  fluticasone (FLONASE) 50 MCG/ACT nasal spray Place 2 sprays into both nostrils as needed. FOR ALLERGIES 11/09/13  Yes Historical Provider, MD  sertraline (ZOLOFT) 50 MG tablet Take 50  mg by mouth every morning.    Yes Historical Provider, MD   BP 119/79 mmHg  Pulse 65  Temp(Src) 98.4 F (36.9 C) (Oral)  Resp 18  SpO2 100% Physical Exam  Constitutional: He is oriented to person, place, and time. No distress.  HENT:  Head: Normocephalic and atraumatic.  Eyes: EOM are normal. Pupils are equal, round, and reactive to light.  Neck: Normal range of motion. Neck supple.  Cardiovascular: Normal rate.   Pulmonary/Chest: Effort normal. No respiratory distress.  Abdominal: Soft. There is no tenderness.  Genitourinary:  Scrotum is swollen diffusely  The skin of the scrotum is reddish but no increased warmth, induration, or skin breakdown  No penile discharge, no testicular ttp  Musculoskeletal: Normal range of motion.  Neurological: He is alert and oriented to person, place, and time.  Skin: No rash noted. He is not diaphoretic.  Psychiatric: He has a normal mood and affect.    ED Course  Procedures (including critical care time) Labs Review Labs Reviewed  COMPREHENSIVE METABOLIC PANEL - Abnormal; Notable for the following:    Glucose, Bld 112 (*)    BUN 24 (*)    AST 43 (*)    GFR calc non Af Amer 79 (*)    Anion gap 4 (*)    All other components within normal limits  CBC WITH DIFFERENTIAL/PLATELET  LIPASE, BLOOD  URINALYSIS, ROUTINE W REFLEX MICROSCOPIC  GC/CHLAMYDIA PROBE AMP ([place])    Imaging Review US Scrotum  05/10/2014   CLINICAL DATA:  Scrotal swelling  EXAM: ULTRASOUND OF SCROTUM  TECHNIQUE: Complete ultrasound examination of the testicles, epididymis, and other scrotal structures was performed.  COMPARISON:  None.  FINDINGS: Right testicle  Measurements: 4.5 x 2.8 x 2.7 cm. No mass or microlithiasis visualized.  Left testicle  Measurements: 4.1 x 2.6 x 2.8 cm. No mass or microlithiasis visualized.  Right epididymis: Incidental 3 mm cyst in the body. No enlargement or abnormal vascularity.  Left epididymis:  Normal in size and appearance.   Hydrocele:  None visualized.  Varicocele:  None visualized.  The scrotal wall is diffusely thickened. There is no evidence of edematous bands, subcutaneous gas, or abscess.  IMPRESSION: 1. Nonspecific scrotal wall thickening without fluid collection. 2. Normal testicles.   Electronically Signed   By: Monte Fantasia M.D.   On: 05/10/2014 21:09     EKG Interpretation None      MDM   Final diagnoses:  Swollen scrotum    39 year old male who is presenting with greater than 6 months of intermittent groin swelling which has become worse again in the last few days.  No fevers/chills.  No testicular pain.  No penile discharge.  He is being followed by urology as an outpatient.  Exam as above.  The scrotum is diffusely edematous and mildly red but no induration or fluctuance.  No skin breakdown.  No penile discharge.  No testicular ttp.  He is afebrile  Basic labs obtained and he has no white count.  UA non-infected.  Gc/chl sent off of urine.  US of the testes and scrotum shows diffuse scrotal edema w/o evidence of cellulitis or abscess.    At this point, picture is consistent w/ edema w/o obvious infection.  Feel safe for d/c with continued management by his pcp and urologist.  I have discussed the results, Dx and Tx plan with the patient. They expressed understanding and agree with the plan and were told to return to ED with any worsening of condition or concern.    Disposition: Discharge  Condition: Good  Discharge Medication List as of 05/10/2014  9:16 PM      Follow Up: Dione Housekeeper, MD [employer] [address] [phone]      Pt seen in conjunction with Dr. Alecia Lemming, MD 05/11/14 0236  Jola Schmidt, MD 05/11/14 [phone]

## 2014-05-10 NOTE — Discharge Instructions (Signed)
Scrotal Swelling Scrotal swelling may occur on one or both sides of the scrotum. Pain may also occur with swelling. Possible causes of scrotal swelling include:   Injury.  Infection.  An ingrown hair or abrasion in the area.  Repeated rubbing from tight-fitting underwear.  Poor hygiene.  A weakened area in the muscles around the groin (hernia). A hernia can allow abdominal contents to push into the scrotum.  Fluid around the testicle (hydrocele).  Enlarged vein around the testicle (varicocele).  Certain medical treatments or existing conditions.  A recent genital surgery or procedure.  The spermatic cord becomes twisted in the scrotum, which cuts off blood supply (testicular torsion).  Testicular cancer. HOME CARE INSTRUCTIONS Once the cause of your scrotal swelling has been determined, you may be asked to monitor your scrotum for any changes. The following actions may help to alleviate any discomfort you are experiencing:  Rest and limit activity until the swelling goes away. Lying down is the preferred position.  Put ice on the scrotum:  Put ice in a plastic bag.  Place a towel between your skin and the bag.  Leave the ice on for 20 minutes, 2-3 times a day for 1-2 days.  Place a rolled towel under the testicles for support.  Wear loose-fitting clothing or an athletic support cup for comfort.  Take all medicines as directed by your health care provider.  Perform a monthly self-exam of the scrotum and penis. Feel for changes. Ask your health care provider how to perform a monthly self-exam if you are unsure. SEEK MEDICAL CARE IF:  You have a sudden (acute) onset of pain that is persistent and not improving.  You notice a heavy feeling or fluid in the scrotum.  You have pain or burning while urinating.  You have blood in the urine or semen.  You feel a lump around the testicle.  You notice that one testicle is larger than the other (slight variation is  normal).  You have a persistent dull ache or pain in the groin or scrotum. SEEK IMMEDIATE MEDICAL CARE IF:  The pain does not go away or becomes severe.  You have a fever or shaking chills.  You have pain or vomiting that cannot be controlled.  You notice significant redness or swelling of one or both sides of the scrotum.  You experience redness spreading upward from your scrotum to your abdomen or downward from your scrotum to your thighs. MAKE SURE YOU:  Understand these instructions.  Will watch your condition.  Will get help right away if you are not doing well or get worse. Document Released: 02/22/2010 Document Revised: 09/22/2012 Document Reviewed: 06/24/2012 [facility] Patient Information 2015 [place], [place]. This information is not intended to replace advice given to you by your health care provider. Make sure you discuss any questions you have with your health care provider.

## 2014-05-10 NOTE — ED Notes (Signed)
MD at bedside. 

## 2014-05-10 NOTE — ED Notes (Signed)
Pt reports testicle pain,difficulty walking and urinating. Pt reports purple and black coloration of testicle.   Pt has had antibiotics for this problem.   Pt went to [facility] today and was sent here for further evaluation and an Ultrasound study.  Pt alert and oriented.

## 2014-05-10 NOTE — ED Notes (Signed)
Pt made aware to return if symptoms worsen or if any life threatening symptoms occur.   

## 2014-05-11 LAB — GC/CHLAMYDIA PROBE AMP (~~LOC~~) NOT AT ARMC
Chlamydia: NEGATIVE
Neisseria Gonorrhea: NEGATIVE

## 2014-08-31 DIAGNOSIS — N5089 Other specified disorders of the male genital organs: Secondary | ICD-10-CM | POA: Insufficient documentation

## 2014-11-13 ENCOUNTER — Ambulatory Visit (INDEPENDENT_AMBULATORY_CARE_PROVIDER_SITE_OTHER): Payer: Medicare Other | Admitting: Physician Assistant

## 2014-11-13 ENCOUNTER — Encounter: Payer: Self-pay | Admitting: Physician Assistant

## 2014-11-13 ENCOUNTER — Other Ambulatory Visit (INDEPENDENT_AMBULATORY_CARE_PROVIDER_SITE_OTHER): Payer: Medicare Other

## 2014-11-13 VITALS — BP 118/88 | HR 62 | Ht 69.75 in | Wt 237.0 lb

## 2014-11-13 DIAGNOSIS — R140 Abdominal distension (gaseous): Secondary | ICD-10-CM

## 2014-11-13 DIAGNOSIS — R112 Nausea with vomiting, unspecified: Secondary | ICD-10-CM

## 2014-11-13 DIAGNOSIS — R1010 Upper abdominal pain, unspecified: Secondary | ICD-10-CM | POA: Diagnosis not present

## 2014-11-13 LAB — COMPREHENSIVE METABOLIC PANEL
ALT: 40 U/L (ref 0–53)
AST: 22 U/L (ref 0–37)
Albumin: 4.3 g/dL (ref 3.5–5.2)
Alkaline Phosphatase: 60 U/L (ref 39–117)
BUN: 18 mg/dL (ref 6–23)
CO2: 28 mEq/L (ref 19–32)
Calcium: 10.1 mg/dL (ref 8.4–10.5)
Chloride: 104 mEq/L (ref 96–112)
Creatinine, Ser: 1.04 mg/dL (ref 0.40–1.50)
GFR: 85.38 mL/min (ref >60.00)
Glucose, Bld: 94 mg/dL (ref 70–99)
Potassium: 4.6 mEq/L (ref 3.5–5.1)
Sodium: 139 mEq/L (ref 135–145)
Total Bilirubin: 0.3 mg/dL (ref 0.2–1.2)
Total Protein: 7.4 g/dL (ref 6.0–8.3)

## 2014-11-13 LAB — CBC WITH DIFFERENTIAL/PLATELET
Basophils Absolute: 0 10*3/uL (ref 0.0–0.1)
Basophils Relative: 0.6 % (ref 0.0–3.0)
Eosinophils Absolute: 0.2 10*3/uL (ref 0.0–0.7)
Eosinophils Relative: 2.4 % (ref 0.0–5.0)
HCT: 44.8 % (ref 39.0–52.0)
Hemoglobin: 14.9 g/dL (ref 13.0–17.0)
Lymphocytes Relative: 32.7 % (ref 12.0–46.0)
Lymphs Abs: 2.7 10*3/uL (ref 0.7–4.0)
MCHC: 33.3 g/dL (ref 30.0–36.0)
MCV: 85.9 fl (ref 78.0–100.0)
Monocytes Absolute: 0.6 10*3/uL (ref 0.1–1.0)
Monocytes Relative: 7.7 % (ref 3.0–12.0)
Neutro Abs: 4.7 10*3/uL (ref 1.4–7.7)
Neutrophils Relative %: 56.6 % (ref 43.0–77.0)
Platelets: 392 10*3/uL (ref 150.0–400.0)
RBC: 5.22 Mil/uL (ref 4.22–5.81)
RDW: 13.2 % (ref 11.5–15.5)
WBC: 8.3 10*3/uL (ref 4.0–10.5)

## 2014-11-13 LAB — LIPASE: Lipase: 47 U/L (ref 11.0–59.0)

## 2014-11-13 NOTE — Progress Notes (Signed)
Patient ID: Tim Nelson, male   DOB: [birth year], 39 y.o.   MRN: [id number]   Subjective:    Patient ID: Tim Nelson, male    DOB: 1975/10/27, 39 y.o.   MRN: [id number]  HPI  Tim Nelson is a 39 year old white male known to Dr. Deatra Nelson who was last seen in the office in May 2015. He has diagnosis of chronic GERD with significant acid reflux. He had undergone endoscopy in February 2015 which was a normal exam and also had manometry which was unremarkable and a bravo pH study which was consistent with significant acid reflux, done off of medication. He had 4 time been on twice a day Dexilant , however's insurance would not pay for this and is currently on once daily therapy. He also had colonoscopy done in May 2014 which was a normal exam. He has history of colon cancer in his brother diagnosed in his late 3s. Patient is currently being referred back by his PCP Tim Nelson for evaluation of upper abdominal pain nausea vomiting and abdominal distention. Patient states that this is different than his usual symptoms with heartburn and acid reflux. He has been having symptoms over the past few months and these been occurring daily. Complains of a feeling of pressure and bloating in his upper abdomen which may last for several hours at a time and is usually resulting in vomiting. Once he vomits he feels better. His appetite has been okay his weight is down about 5 pounds. His bowel movements have been fairly normal no melena or hematochezia. He says his abdomen in general feels bigger than it used to be. He also has had recent urologic evaluation after he had an ER visit with swollen testicles. His mother tells me that the urologist who was in [place] told them that the testicular swelling was from increased abdominal pressure. Patient has no history of liver disease, no family history of liver disease and is a nondrinker.  Review of Systems Pertinent positive and negative review of systems were noted in the  above HPI section.  All other review of systems was otherwise negative.  Outpatient Encounter Prescriptions as of 11/13/2014  Medication Sig  . acetaminophen (TYLENOL) 500 MG tablet Take 500 mg by mouth every 6 (six) hours as needed for mild pain or moderate pain.   . ARIPiprazole (ABILIFY) 5 MG tablet Take 5 mg by mouth daily.  [name] Kitchen atorvastatin (LIPITOR) 10 MG tablet Take 10 mg by mouth every evening.   . Ca Carbonate-Mag Hydroxide (ROLAIDS) 550-110 MG CHEW Chew 1 tablet by mouth. For hearburn as needed.  [name] Kitchen dexlansoprazole (DEXILANT) 60 MG capsule Take 1 capsule (60 mg total) by mouth daily.  . fenofibrate micronized (LOFIBRA) 134 MG capsule Take 134 mg by mouth daily before breakfast.  . fluticasone (FLONASE) 50 MCG/ACT nasal spray Place 2 sprays into both nostrils as needed. FOR ALLERGIES  . sertraline (ZOLOFT) 50 MG tablet Take 50 mg by mouth every morning.   . simethicone (MYLICON) 662 MG chewable tablet Chew 125 mg by mouth every 6 (six) hours as needed for flatulence (as needed.).  [name] Kitchen fexofenadine (ALLEGRA) 180 MG tablet Take 180 mg by mouth every morning.    No facility-administered encounter medications on file as of 11/13/2014.   Allergies  Allergen Reactions  . Cefaclor Shortness Of Breath and Swelling  . Dimetapp Dm Cold-Cough [Phenylephrine-Bromphen-Dm] Shortness Of Breath and Swelling   Patient Active Problem List   Diagnosis Date Noted  . Cough 02/11/2013  .  Stricture and stenosis of esophagus 12/11/2010  . Family history of malignant neoplasm of gastrointestinal tract 10/23/2010  . ANXIETY DEPRESSION 04/25/2008  . HEMORRHOIDS 04/25/2008  . GERD 04/25/2008  . POLYP, COLON 05/13/2007   Social History   Social History  . Marital Status: Single    Spouse Name: N/A  . Number of Children: N/A  . Years of Education: N/A   Occupational History  . Unemployed     Social History Main Topics  . Smoking status: Never Smoker   . Smokeless tobacco: Never Used  . Alcohol  Use: No  . Drug Use: No  . Sexual Activity: Not on file   Other Topics Concern  . Not on file   Social History Narrative    Tim Nelson family history includes Colon cancer (age of onset: 44) in his brother; Colon cancer (age of onset: 69) in his maternal grandfather; Colon polyps in his mother; Diabetes in his mother; Esophageal cancer in his maternal grandfather; Irritable bowel syndrome in his mother; Liver cancer in his brother.      Objective:    Filed Vitals:   11/13/14 1045  BP: 118/88  Pulse: 62    Physical Exam  well-developed white male in no acute distress, accompanied by his mother blood pressure 118/88 pulse 62 height 5 foot 9 weight 237, BMI 34. HEENT; nontraumatic normocephalic EOMI PERRLA sclera anicteric, Neck; supple no JVD, Cardiovascular ;regular rate and rhythm with S1-S2 no murmur or gallop, Pulmonary; clear bilaterally, Abdomen; large soft protuberant no definite fluid wave has tenderness in the epigastrium there is no guarding or rebound no palpable mass or hepatosplenomegaly Rectal; exam not done, Extremities; no clubbing cyanosis or edema skin warm and dry, Neuro/psych; mood and affect appropriate       Assessment & Plan:   #1 39 yo male with chronic GERD #2  2-3 month hx of upper abdominal discomfort ,abdomianl distention, bloating and almost daily vomiting- etiology not clear- R/O ascites, gallbladder disease, underlying liver disease #3 recent testicular swelling #4 Family hx of colon cancer in sibling at young age (brother in 59's) #5 unspecified psychiatric disorder  Plan; Cbc, cmet ,lipase  Ct abdomen /pelvis with contrast  Continue Dexilant  60 mg po daily in am  Further w/u pending above   Pt will be established with Dr . Tim Nelson   Amy S Esterwood PA-C 11/13/2014   Cc: Tim Housekeeper, MD

## 2014-11-13 NOTE — Patient Instructions (Addendum)
Your physician has requested that you go to the basement for the following lab work before leaving today: CBC, CMET, Lipase You have been scheduled for a CT scan of the abdomen and pelvis at [place] [facility] ([address]---this is in the same building as Press photographer).   You are scheduled on 11-16-14 at 9:30am You should arrive 15 minutes prior to your appointment time for registration. Please follow the written instructions below on the day of your exam:  WARNING: IF YOU ARE ALLERGIC TO IODINE/X-RAY DYE, PLEASE NOTIFY RADIOLOGY IMMEDIATELY AT [phone]! YOU WILL BE GIVEN A 13 HOUR PREMEDICATION PREP.  1) Do not eat or drink anything after  (4 hours prior to your test) 2) You have been given 2 bottles of oral contrast to drink. The solution may taste better if refrigerated, but do NOT add ice or any other liquid to this solution. Shake well before drinking.    Drink 1 bottle of contrast @ 7:30am (2 hours prior to your exam)  Drink 1 bottle of contrast @ 8:30am (1 hour prior to your exam)  You may take any medications as prescribed with a small amount of water except for the following: Metformin, Glucophage, Glucovance, Avandamet, Riomet, Fortamet, Actoplus Met, Janumet, Glumetza or Metaglip. The above medications must be held the day of the exam AND 48 hours after the exam.  The purpose of you drinking the oral contrast is to aid in the visualization of your intestinal tract. The contrast solution may cause some diarrhea. Before your exam is started, you will be given a small amount of fluid to drink. Depending on your individual set of symptoms, you may also receive an intravenous injection of x-ray contrast/dye. Plan on being at [facility] for 30 minutes or long, depending on the type of exam you are having performed.  This test typically takes 30-45 minutes to complete.  If you have any questions regarding your exam or if you need to reschedule, you may call the CT  department at [phone] between the hours of 8:00 am and 5:00 pm, Monday-Friday.  ________________________________________________________________________  Please follow up with Dr. Havery Moros on 01-10-15 @ 9:45am

## 2014-11-14 NOTE — Progress Notes (Signed)
Agree with assessment and plan as outlined.  

## 2014-11-16 ENCOUNTER — Ambulatory Visit (INDEPENDENT_AMBULATORY_CARE_PROVIDER_SITE_OTHER)
Admission: RE | Admit: 2014-11-16 | Discharge: 2014-11-16 | Disposition: A | Discharge status: Home / Self Care | Payer: Medicare Other | Source: Ambulatory Visit | Attending: Physician Assistant | Admitting: Physician Assistant

## 2014-11-16 DIAGNOSIS — R140 Abdominal distension (gaseous): Secondary | ICD-10-CM | POA: Diagnosis not present

## 2014-11-16 DIAGNOSIS — R1010 Upper abdominal pain, unspecified: Secondary | ICD-10-CM | POA: Diagnosis not present

## 2014-11-16 DIAGNOSIS — R112 Nausea with vomiting, unspecified: Secondary | ICD-10-CM

## 2014-11-16 MED ORDER — IOHEXOL 300 MG/ML  SOLN
100.0000 mL | Freq: Once | INTRAMUSCULAR | Status: AC | PRN
  Administered 2014-11-16: 100 mL via INTRAVENOUS

## 2014-11-17 ENCOUNTER — Encounter: Payer: Self-pay | Admitting: *Deleted

## 2014-11-17 ENCOUNTER — Other Ambulatory Visit: Payer: Self-pay | Admitting: *Deleted

## 2014-11-17 DIAGNOSIS — R112 Nausea with vomiting, unspecified: Secondary | ICD-10-CM

## 2014-11-17 DIAGNOSIS — R109 Unspecified abdominal pain: Secondary | ICD-10-CM

## 2014-11-17 DIAGNOSIS — R140 Abdominal distension (gaseous): Secondary | ICD-10-CM

## 2014-11-22 ENCOUNTER — Ambulatory Visit: Payer: Medicare Other | Admitting: Gastroenterology

## 2014-11-27 ENCOUNTER — Ambulatory Visit (HOSPITAL_COMMUNITY)
Admission: RE | Admit: 2014-11-27 | Discharge: 2014-11-27 | Disposition: A | Discharge status: Home / Self Care | Payer: Medicare Other | Source: Ambulatory Visit | Attending: Physician Assistant | Admitting: Physician Assistant

## 2014-11-27 DIAGNOSIS — R140 Abdominal distension (gaseous): Secondary | ICD-10-CM | POA: Diagnosis not present

## 2014-11-27 DIAGNOSIS — R109 Unspecified abdominal pain: Secondary | ICD-10-CM | POA: Diagnosis present

## 2014-11-27 DIAGNOSIS — R112 Nausea with vomiting, unspecified: Secondary | ICD-10-CM | POA: Insufficient documentation

## 2014-11-27 MED ORDER — SINCALIDE 5 MCG IJ SOLR
0.0200 ug/kg | Freq: Once | INTRAMUSCULAR | Status: AC
  Administered 2014-11-27: 2.2 ug via INTRAVENOUS

## 2014-11-27 MED ORDER — TECHNETIUM TC 99M MEBROFENIN IV KIT
5.4000 | PACK | Freq: Once | INTRAVENOUS | Status: DC | PRN
  Administered 2014-11-27: 5 via INTRAVENOUS
  Filled 2014-11-27: qty 6

## 2015-01-08 ENCOUNTER — Encounter: Payer: Self-pay | Admitting: Gastroenterology

## 2015-01-10 ENCOUNTER — Ambulatory Visit: Payer: Medicare Other | Admitting: Gastroenterology

## 2015-01-18 ENCOUNTER — Ambulatory Visit: Payer: Medicare Other | Admitting: Gastroenterology

## 2015-01-19 ENCOUNTER — Ambulatory Visit: Payer: Medicare Other | Admitting: Gastroenterology

## 2015-03-23 ENCOUNTER — Ambulatory Visit: Payer: Medicare Other | Admitting: Gastroenterology

## 2015-04-19 ENCOUNTER — Emergency Department (HOSPITAL_COMMUNITY)
Admission: EM | Admit: 2015-04-19 | Discharge: 2015-04-20 | Disposition: A | Discharge status: Home / Self Care | Payer: Medicare Other | Attending: Emergency Medicine | Admitting: Emergency Medicine

## 2015-04-19 ENCOUNTER — Emergency Department (HOSPITAL_COMMUNITY): Payer: Medicare Other

## 2015-04-19 ENCOUNTER — Encounter (HOSPITAL_COMMUNITY): Payer: Self-pay | Admitting: Nurse Practitioner

## 2015-04-19 DIAGNOSIS — R059 Cough, unspecified: Secondary | ICD-10-CM

## 2015-04-19 DIAGNOSIS — F418 Other specified anxiety disorders: Secondary | ICD-10-CM | POA: Insufficient documentation

## 2015-04-19 DIAGNOSIS — K219 Gastro-esophageal reflux disease without esophagitis: Secondary | ICD-10-CM | POA: Diagnosis not present

## 2015-04-19 DIAGNOSIS — N179 Acute kidney failure, unspecified: Secondary | ICD-10-CM | POA: Insufficient documentation

## 2015-04-19 DIAGNOSIS — E785 Hyperlipidemia, unspecified: Secondary | ICD-10-CM | POA: Diagnosis not present

## 2015-04-19 DIAGNOSIS — R05 Cough: Secondary | ICD-10-CM

## 2015-04-19 DIAGNOSIS — J111 Influenza due to unidentified influenza virus with other respiratory manifestations: Secondary | ICD-10-CM | POA: Diagnosis not present

## 2015-04-19 DIAGNOSIS — E860 Dehydration: Secondary | ICD-10-CM | POA: Diagnosis not present

## 2015-04-19 DIAGNOSIS — Z79899 Other long term (current) drug therapy: Secondary | ICD-10-CM | POA: Diagnosis not present

## 2015-04-19 DIAGNOSIS — R69 Illness, unspecified: Secondary | ICD-10-CM

## 2015-04-19 DIAGNOSIS — F840 Autistic disorder: Secondary | ICD-10-CM | POA: Diagnosis not present

## 2015-04-19 DIAGNOSIS — R079 Chest pain, unspecified: Secondary | ICD-10-CM | POA: Diagnosis present

## 2015-04-19 DIAGNOSIS — Z8719 Personal history of other diseases of the digestive system: Secondary | ICD-10-CM | POA: Insufficient documentation

## 2015-04-19 DIAGNOSIS — R509 Fever, unspecified: Secondary | ICD-10-CM

## 2015-04-19 HISTORY — DX: Autistic disorder: F84.0

## 2015-04-19 LAB — URINALYSIS, ROUTINE W REFLEX MICROSCOPIC
Bilirubin Urine: NEGATIVE
Glucose, UA: NEGATIVE mg/dL
Hgb urine dipstick: NEGATIVE
Ketones, ur: NEGATIVE mg/dL
Leukocytes, UA: NEGATIVE
Nitrite: NEGATIVE
Protein, ur: NEGATIVE mg/dL
Specific Gravity, Urine: 1.026 (ref 1.005–1.030)
pH: 5.5 (ref 5.0–8.0)

## 2015-04-19 LAB — CBC
HCT: 43.3 % (ref 39.0–52.0)
Hemoglobin: 14.6 g/dL (ref 13.0–17.0)
MCH: 28.3 pg (ref 26.0–34.0)
MCHC: 33.7 g/dL (ref 30.0–36.0)
MCV: 84.1 fL (ref 78.0–100.0)
Platelets: 253 10*3/uL (ref 150–400)
RBC: 5.15 MIL/uL (ref 4.22–5.81)
RDW: 12.6 % (ref 11.5–15.5)
WBC: 5.9 10*3/uL (ref 4.0–10.5)

## 2015-04-19 LAB — BASIC METABOLIC PANEL
Anion gap: 13 (ref 5–15)
BUN: 23 mg/dL — ABNORMAL HIGH (ref 6–20)
CO2: 25 mmol/L (ref 22–32)
Calcium: 10 mg/dL (ref 8.9–10.3)
Chloride: 100 mmol/L — ABNORMAL LOW (ref 101–111)
Creatinine, Ser: 1.32 mg/dL — ABNORMAL HIGH (ref 0.61–1.24)
GFR calc Af Amer: >60 mL/min (ref >60)
GFR calc non Af Amer: >60 mL/min (ref >60)
Glucose, Bld: 115 mg/dL — ABNORMAL HIGH (ref 65–99)
Potassium: 3.9 mmol/L (ref 3.5–5.1)
Sodium: 138 mmol/L (ref 135–145)

## 2015-04-19 LAB — I-STAT TROPONIN, ED: Troponin i, poc: 0 ng/mL (ref 0.00–0.08)

## 2015-04-19 LAB — I-STAT CG4 LACTIC ACID, ED: Lactic Acid, Venous: 1.57 mmol/L (ref 0.5–2.0)

## 2015-04-19 MED ORDER — ACETAMINOPHEN 325 MG PO TABS
650.0000 mg | ORAL_TABLET | Freq: Once | ORAL | Status: AC | PRN
  Administered 2015-04-19: 650 mg via ORAL

## 2015-04-19 MED ORDER — SODIUM CHLORIDE 0.9 % IV BOLUS (SEPSIS)
1000.0000 mL | Freq: Once | INTRAVENOUS | Status: AC
  Administered 2015-04-20: 1000 mL via INTRAVENOUS

## 2015-04-19 MED ORDER — ACETAMINOPHEN 325 MG PO TABS
ORAL_TABLET | ORAL | Status: AC
  Filled 2015-04-19: qty 2

## 2015-04-19 NOTE — ED Provider Notes (Signed)
CSN: [id number]     Arrival date & time 04/19/15  1727 History  By signing my name below, I, Tim Nelson, attest that this documentation has been prepared under the direction and in the presence of Gareth Morgan, MD . Electronically Signed: Evelene Nelson, Scribe. 04/20/2015. 12:15 AM.    Chief Complaint  Patient presents with  . Chest Pain   Patient is a 40 y.o. male presenting with chest pain. The history is provided by the patient and a relative (sister). No language interpreter was used.  Chest Pain Pain location:  Substernal area Pain quality: tightness   Pain radiates to:  Does not radiate Pain radiates to the back: no   Pain severity:  Moderate Onset quality:  Gradual Timing:  Intermittent Worsened by:  Certain positions Ineffective treatments:  Antacids Associated symptoms: cough, fatigue, fever, headache, nausea and vomiting   Associated symptoms: no abdominal pain and no back pain   Risk factors: high cholesterol   Risk factors: no coronary artery disease, no diabetes mellitus, no hypertension, no immobilization, no prior DVT/PE, no smoking and no surgery     HPI Comments:  Tim Nelson is a 40 y.o. male with a history of autism and GERD, who presents to the Emergency Department complaining of intermittent central CP x ~4-5 days.Pt's sister states pt described his CP as a elephant sitting on his chest. He notes his pain is exacerbated when he changes positions from laying to sitting and with exertion. He has taken antacids without relief. He reports associated HA , dry cough, nausea, vomiting, fatigue, and intermittent fever x ~ 4 days.   He denies abdominal pain, diarrhea, rhinorrhea, sore throat, ear pain, back pain and SOB.  No alleviating factors noted. No recent sick contacts with flu. He denies h/o DM, CHF, blood clots, recent prolonged periods of immobilization, recent surgery, and smoking hx. Pt has a family h/o heart issues; sister notes father was in his 80's when  he had an MI and their mother has h/o CHF.  PCP: [facility]   Past Medical History  Diagnosis Date  . Anxiety and depression   . Dysthymic disorder   . Esophagitis, unspecified   . Esophageal stricture   . Hx of colonic polyps   . Hyperlipemia   . Seasonal allergies   . GERD (gastroesophageal reflux disease)   . History of chronic cough     dry cough-sleeps with elevated head of bed.  . Autism    Past Surgical History  Procedure Laterality Date  . Dental surgery    . Colonscopy  2014    hx. polyps with past testing  . Hernia repair  AS INFANT    groin  . Esophagogastroduodenoscopy N/A 03/28/2013    Procedure: ESOPHAGOGASTRODUODENOSCOPY (EGD);  Surgeon: Inda Castle, MD;  Location: [facility];  Service: Endoscopy;  Laterality: N/A;  . Bravo ph study N/A 03/28/2013    Procedure: BRAVO [place] [place] STUDY;  Surgeon: Inda Castle, MD;  Location: [facility];  Service: Endoscopy;  Laterality: N/A;  . Esophageal manometry N/A 04/25/2013    Procedure: ESOPHAGEAL MANOMETRY (EM);  Surgeon: Inda Castle, MD;  Location: [facility];  Service: Endoscopy;  Laterality: N/A;   Family History  Problem Relation Age of Onset  . Colon cancer Brother 40  . Colon cancer Maternal Grandfather 59  . Esophageal cancer Maternal Grandfather   . Liver cancer Brother   . Irritable bowel syndrome Mother   . Colon polyps Mother   . Diabetes  Mother    Social History  Substance Use Topics  . Smoking status: Never Smoker   . Smokeless tobacco: Never Used  . Alcohol Use: No    Review of Systems  Constitutional: Positive for fever and fatigue.  HENT: Negative for ear pain, rhinorrhea and sore throat.   Respiratory: Positive for cough.   Cardiovascular: Positive for chest pain.  Gastrointestinal: Positive for nausea and vomiting. Negative for abdominal pain and diarrhea.  Genitourinary: Negative for dysuria.  Musculoskeletal: Negative for back pain.  Neurological: Positive for headaches.  All other  systems reviewed and are negative.   Allergies  Cefaclor and Dimetapp dm cold-cough  Home Medications   Prior to Admission medications   Medication Sig Start Date End Date Taking? Authorizing Provider  acetaminophen (TYLENOL) 500 MG tablet Take 1,000 mg by mouth every 6 (six) hours as needed for mild pain or moderate pain.    Yes Historical Provider, MD  ARIPiprazole (ABILIFY) 5 MG tablet Take 5 mg by mouth daily.   Yes Historical Provider, MD  atorvastatin (LIPITOR) 10 MG tablet Take 10 mg by mouth every evening.    Yes Historical Provider, MD  Ca Carbonate-Mag Hydroxide (ROLAIDS) 550-110 MG CHEW Chew 1 tablet by mouth. Reported on 04/19/2015   Yes Historical Provider, MD  dexlansoprazole (DEXILANT) 60 MG capsule Take 1 capsule (60 mg total) by mouth daily. 04/20/13  Yes Inda Castle, MD  fenofibrate micronized (LOFIBRA) 134 MG capsule Take 134 mg by mouth daily before breakfast.   Yes Historical Provider, MD  fexofenadine (ALLEGRA) 180 MG tablet Take 180 mg by mouth every morning.    Yes Historical Provider, MD  fluticasone (FLONASE) 50 MCG/ACT nasal spray Place 2 sprays into both nostrils as needed. FOR ALLERGIES 11/09/13  Yes Historical Provider, MD  hydrochlorothiazide (HYDRODIURIL) 25 MG tablet Take 25 mg by mouth 2 (two) times daily.   Yes Historical Provider, MD  sertraline (ZOLOFT) 50 MG tablet Take 50 mg by mouth every morning.    Yes Historical Provider, MD  simethicone (MYLICON) 0000000 MG chewable tablet Chew 125 mg by mouth every 6 (six) hours as needed for flatulence (as needed.).   Yes Historical Provider, MD  spironolactone (ALDACTONE) 25 MG tablet Take 25 mg by mouth 2 (two) times daily.   Yes Historical Provider, MD  ondansetron (ZOFRAN ODT) 4 MG disintegrating tablet Take 1 tablet (4 mg total) by mouth every 8 (eight) hours as needed for nausea or vomiting. 04/20/15   Gareth Morgan, MD   BP 125/81 mmHg  Pulse 88  Temp(Src) 98.1 F (36.7 C) (Oral)  Resp 28  SpO2  96% Physical Exam  Constitutional: He is oriented to person, place, and time. He appears well-developed and well-nourished. No distress.  HENT:  Head: Normocephalic and atraumatic.  Mouth/Throat: No oropharyngeal exudate.  TM wnl  Eyes: Conjunctivae and EOM are normal. Pupils are equal, round, and reactive to light.  Neck: Normal range of motion.  Cardiovascular: Normal rate, regular rhythm, normal heart sounds and intact distal pulses.  Exam reveals no gallop and no friction rub.   No murmur heard. Pulmonary/Chest: Effort normal and breath sounds normal. No respiratory distress. He has no wheezes. He has no rales. He exhibits tenderness.  Abdominal: Soft. He exhibits no distension. There is no tenderness. There is no guarding.  Musculoskeletal: He exhibits no edema.  Neurological: He is alert and oriented to person, place, and time.  Skin: Skin is warm and dry. He is not diaphoretic.  Nursing  note and vitals reviewed.   ED Course  Procedures   DIAGNOSTIC STUDIES:  Oxygen Saturation is 96% on RA, normal by my interpretation.    COORDINATION OF CARE:  11:51 PM  Discussed treatment plan with pt at bedside and pt agreed to plan.  Labs Review Labs Reviewed  BASIC METABOLIC PANEL - Abnormal; Notable for the following:    Chloride 100 (*)    Glucose, Bld 115 (*)    BUN 23 (*)    Creatinine, Ser 1.32 (*)    All other components within normal limits  URINALYSIS, ROUTINE W REFLEX MICROSCOPIC (NOT AT [facility]) - Abnormal; Notable for the following:    Color, Urine AMBER (*)    All other components within normal limits  URINE CULTURE  CBC  I-STAT TROPOININ, ED  I-STAT CG4 LACTIC ACID, ED  Randolm Idol, ED    Imaging Review Dg Chest 2 View  04/19/2015  CLINICAL DATA:  Chest pain.  Nausea and vomiting. EXAM: CHEST  2 VIEW COMPARISON:  November 09, 2010 FINDINGS: The heart size and mediastinal contours are within normal limits. Both lungs are clear. The visualized skeletal  structures are unremarkable. IMPRESSION: No active cardiopulmonary disease. Electronically Signed   By: Dorise Bullion III M.D   On: 04/19/2015 18:28   I have personally reviewed and evaluated these images and lab results as part of my medical decision-making.   EKG Interpretation   Date/Time:  Thursday April 19 2015 17:32:12 EDT Ventricular Rate:  107 PR Interval:  156 QRS Duration: 84 QT Interval:  342 QTC Calculation: 456 R Axis:   -47 Text Interpretation:  Sinus tachycardia Left anterior fascicular block  Cannot rule out Anterior infarct , age undetermined Abnormal ECG Since  last ECG, heart rate has increased Confirmed by Pella  ([id number]) on 04/19/2015 11:35:44 PM      MDM   Pt updated with partial results at bedside. CXR negative for acute infiltrate. Will order IV fluids and repeat troponin.  Final diagnoses:  Chest pain, unspecified chest pain type  Cough  Influenza-like illness  Fever, unspecified fever cause    40yo male with history of hyperlipidemia, anxiety, depression, autism, presents with concern for cough, headache, nausea, vomiting, body aches, fevers and chest pain.  No meningeal signs on exam. No signs of pneumonia on CXR.  Urinalysis without UTI.  Benign abdominal exam. Lactic acid WNL.  PERC negative. Low risk HEART score with 2 negative troponins.  GIven history of fever, cough, nausea, body aches, suspect influenza-like illness. CP likely musculoskeletal from coughing.  Patient with mild AKI on labs, likely secondary to dehydration. GIven IVF with improvement in symptoms. Given rx for zofran and recommend close PCP follow up within one week. Patient discharged in stable condition with understanding of reasons to return.   I personally performed the services described in this documentation, which was scribed in my presence. The recorded information has been reviewed and is accurate.   Gareth Morgan, MD 04/20/15 1737

## 2015-04-19 NOTE — ED Notes (Signed)
C/o 1 week history of intermittent episodes of n/v, cp, sweats. Hes had a cough also. He has a history of acid reflux, but hes been taking his acid medication plus taking tums and rolaids this week with no relief. He is A&O, breathing easily.

## 2015-04-19 NOTE — ED Notes (Signed)
Chest pain on and off for 3 days, with pain in back of head, "feel like head is going to bust".

## 2015-04-20 DIAGNOSIS — R079 Chest pain, unspecified: Secondary | ICD-10-CM | POA: Diagnosis not present

## 2015-04-20 LAB — URINE CULTURE: Culture: NO GROWTH

## 2015-04-20 LAB — I-STAT TROPONIN, ED: Troponin i, poc: 0 ng/mL (ref 0.00–0.08)

## 2015-04-20 MED ORDER — ONDANSETRON 4 MG PO TBDP: 4.0000 mg | ORAL_TABLET | Freq: Three times a day (TID) | ORAL | Status: DC | PRN

## 2016-01-03 IMAGING — RF DG ESOPHAGUS
14 of 20 series · 14 of 20 positions shown · non-contrast
Comparison: None.

FLUOROSCOPY TIME:  4 min 20 seconds

CLINICAL DATA: Dysphagia to solids and liquids with feeling of food
getting stuck in the lower chest and then coming back up. History of
esophageal dilatation in 0030.

EXAM:
ESOPHOGRAM / BARIUM SWALLOW / BARIUM TABLET STUDY
TECHNIQUE: Combined double contrast and single contrast examination performed
using effervescent crystals, thick barium liquid, and thin barium
liquid. The patient was observed with fluoroscopy swallowing a 13mm
barium sulphate tablet.

[Series 1: run · 1 of 1 slices shown (1 of 14)]
[im 1/1]
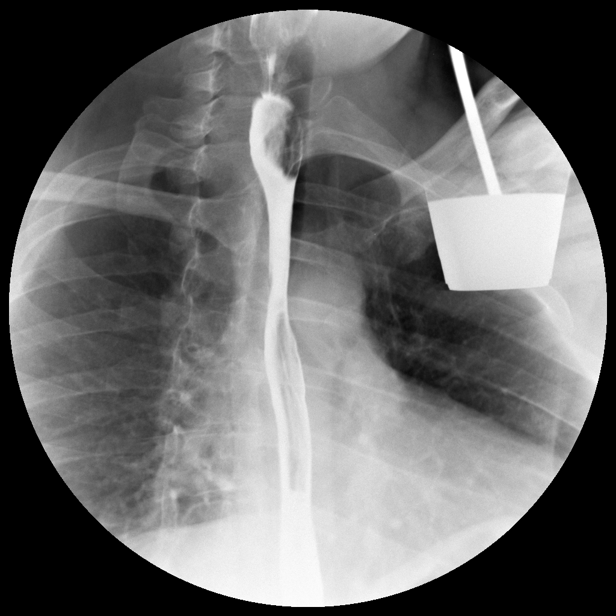

[Series 3: run · 1 of 1 slices shown (2 of 14)]
[im 1/1]
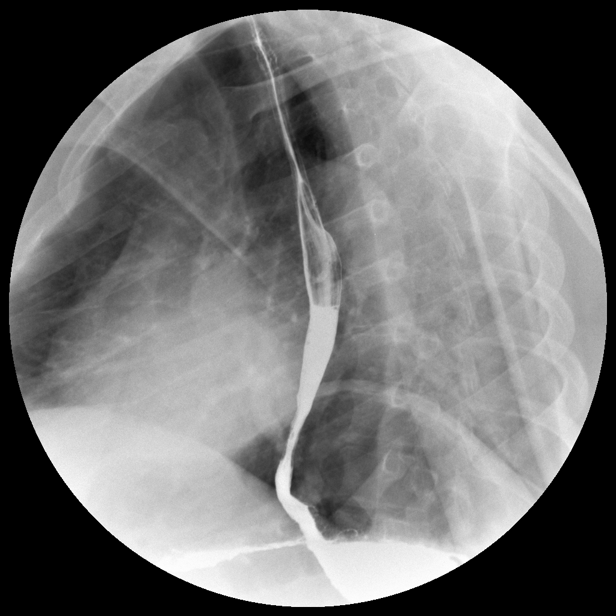

[Series 4: run · 1 of 1 slices shown (3 of 14)]
[im 1/1]
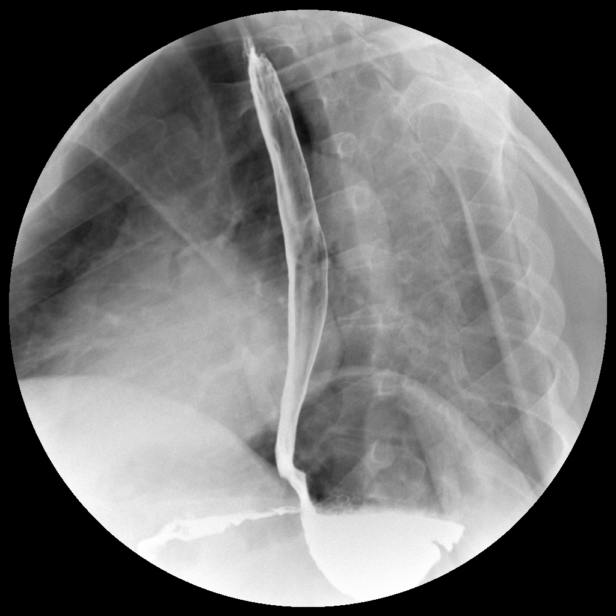

[Series 6: run · 1 of 1 slices shown (4 of 14)]
[im 1/1]
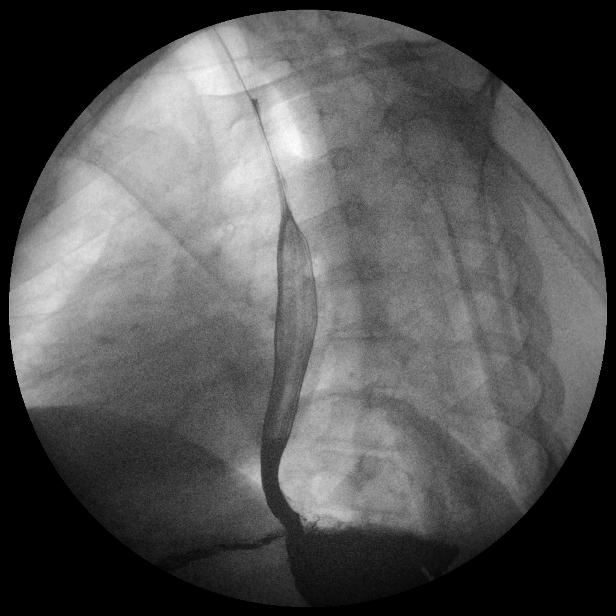

[Series 7: run · 1 of 1 slices shown (5 of 14)]
[im 1/1]
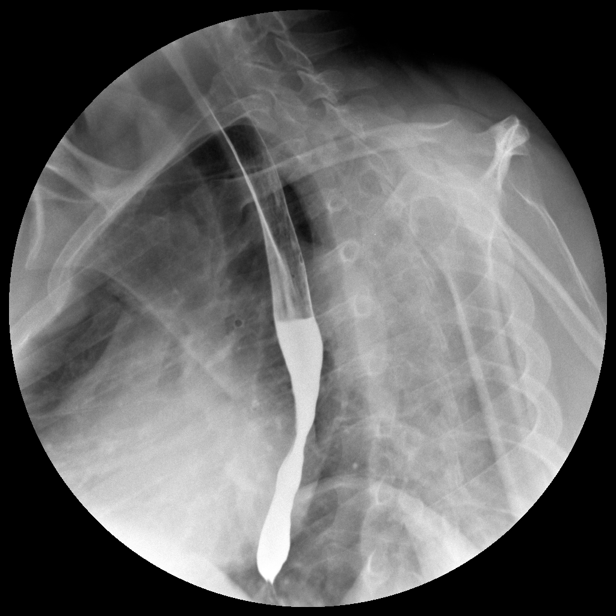

[Series 8: run · 1 of 1 slices shown (6 of 14)]
[im 1/1]
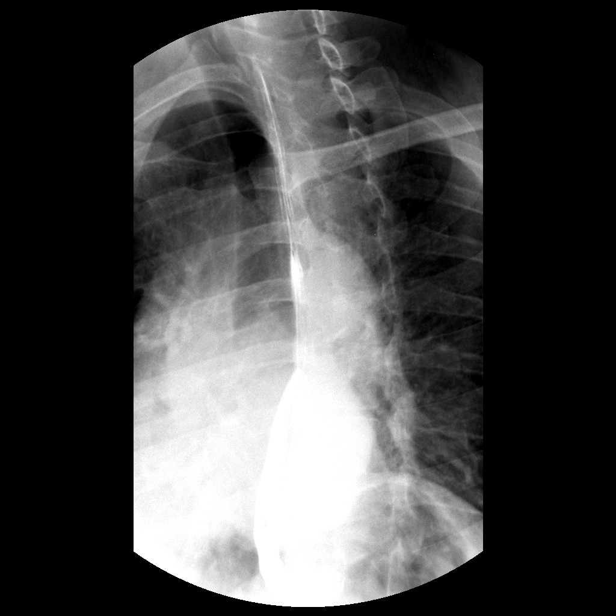

[Series 10: run · 1 of 1 slices shown (7 of 14)]
[im 1/1]
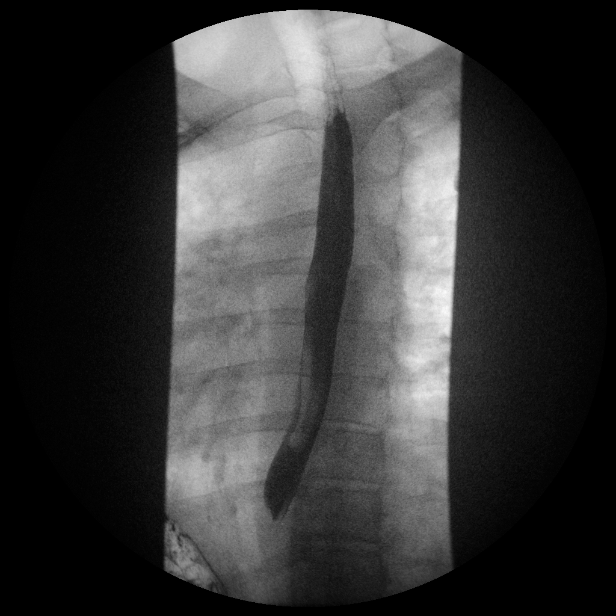

[Series 11: run · 1 of 1 slices shown (8 of 14)]
[im 1/1]
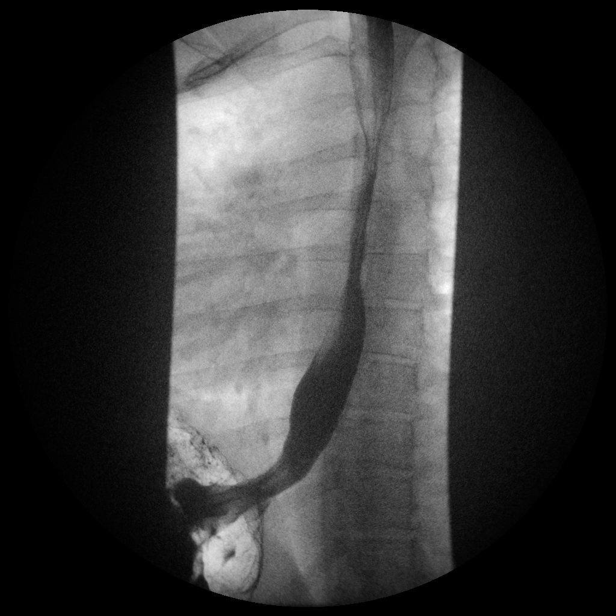

[Series 13: run · 1 of 1 slices shown (9 of 14)]
[im 1/1]
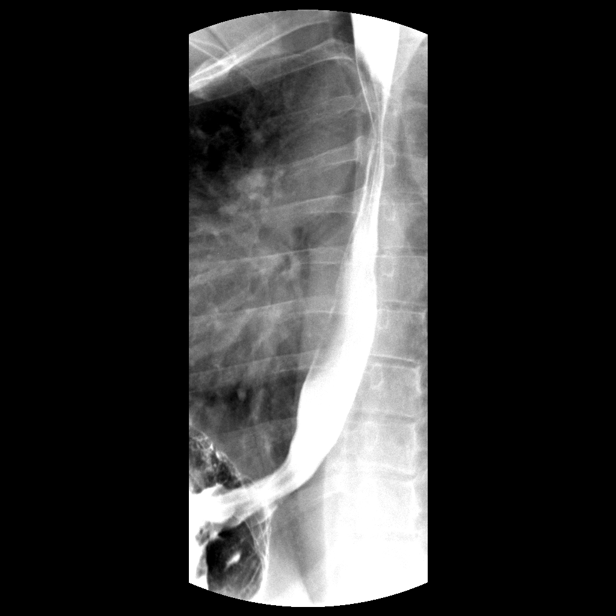

[Series 14: run · 1 of 1 slices shown (10 of 14)]
[im 1/1]
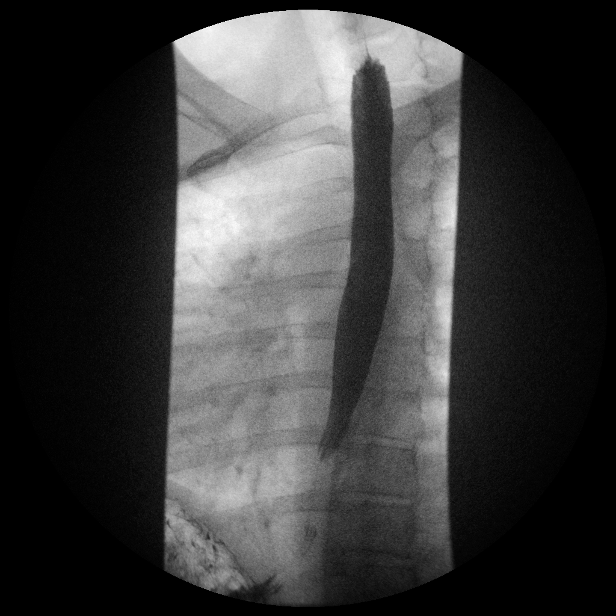

[Series 16: run · 1 of 1 slices shown (11 of 14)]
[im 1/1]
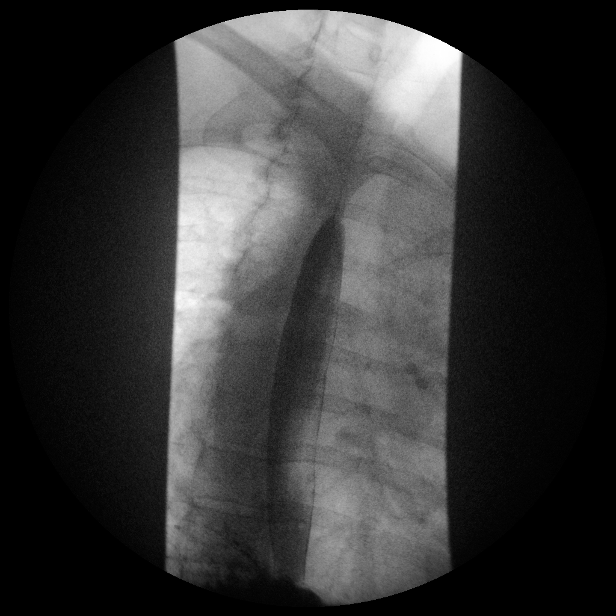

[Series 17: run · 1 of 1 slices shown (12 of 14)]
[im 1/1]
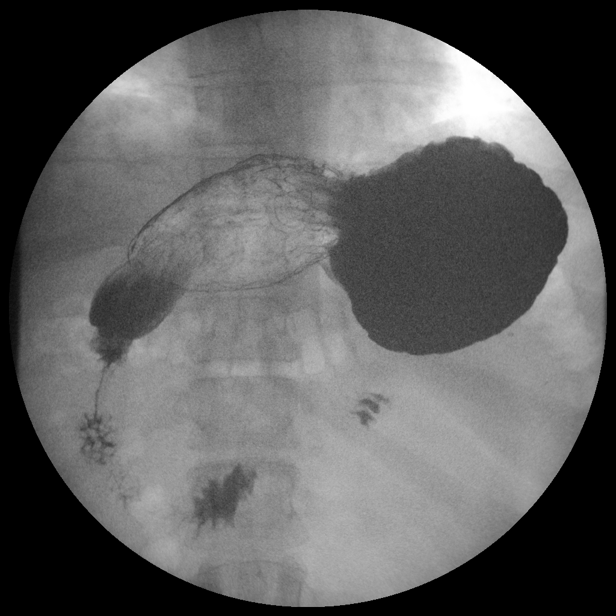

[Series 18: run · 1 of 1 slices shown (13 of 14)]
[im 1/1]
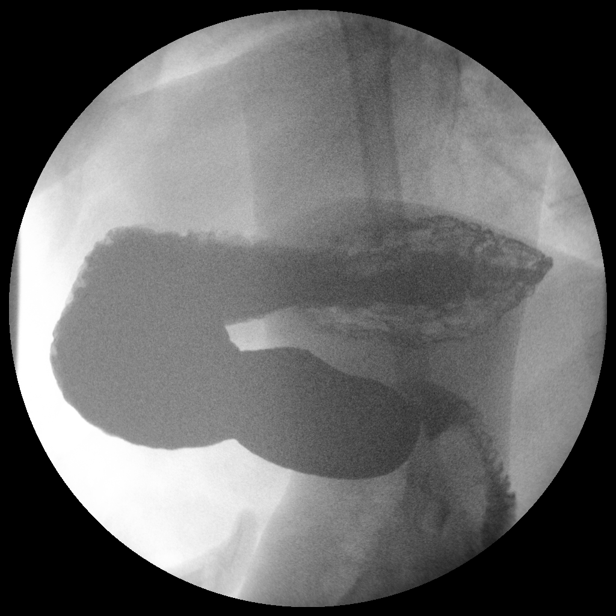

[Series 20: run · 1 of 1 slices shown (14 of 14)]
[im 1/1]
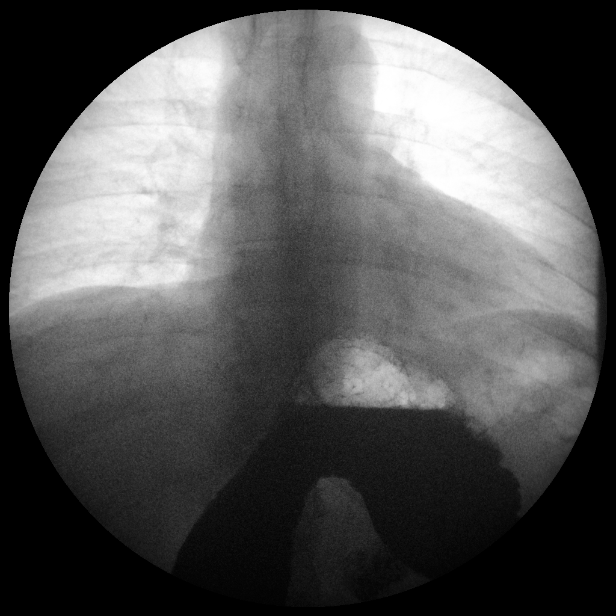

[14 of 20 positions shown; findings below may reference images not displayed]

FINDINGS: Double-contrast examination of the esophagus was without evidence of
gross mucosal lesion. The esophagus appeared normal in caliber
without evidence of stricture. Primary peristalsis appeared normal,
however there were disorganized secondary waves with movement of
contrast back and forth in the esophagus and delayed passage into
the stomach. GE junction opens normally. The position of the gastric
body is mildly atypical in position without evidence of malrotation,
volvulus, or hiatal hernia. Minimal spontaneous gastroesophageal
reflux was seen into the distal esophagus during upright positioning
near the end of the examination. A barium tablet passed without
delay.
IMPRESSION: 1. No evidence of esophageal stricture or mass.
2. Mildly abnormal esophageal motility with some disorganized
secondary waves and delayed passage of contrast into the stomach due
to back and forth movement of contrast within the esophagus.
3. Minimal spontaneous gastroesophageal reflux.  No hiatal hernia.

## 2016-03-03 DIAGNOSIS — I152 Hypertension secondary to endocrine disorders: Secondary | ICD-10-CM | POA: Insufficient documentation

## 2017-06-09 DIAGNOSIS — E1169 Type 2 diabetes mellitus with other specified complication: Secondary | ICD-10-CM | POA: Insufficient documentation

## 2017-06-18 ENCOUNTER — Encounter: Payer: Self-pay | Admitting: Gastroenterology

## 2017-06-23 ENCOUNTER — Encounter: Payer: Self-pay | Admitting: Gastroenterology

## 2017-08-04 ENCOUNTER — Other Ambulatory Visit: Payer: Self-pay

## 2017-08-04 ENCOUNTER — Ambulatory Visit (AMBULATORY_SURGERY_CENTER): Payer: Self-pay | Admitting: *Deleted

## 2017-08-04 VITALS — Ht 72.0 in | Wt 247.2 lb

## 2017-08-04 DIAGNOSIS — Z86010 Personal history of colonic polyps: Secondary | ICD-10-CM

## 2017-08-04 DIAGNOSIS — Z800 Family history of malignant neoplasm of digestive organs: Secondary | ICD-10-CM

## 2017-08-04 MED ORDER — SUPREP BOWEL PREP KIT 17.5-3.13-1.6 GM/177ML PO SOLN: 1.0000 | Freq: Once | ORAL | 0 refills | Status: AC

## 2017-08-04 NOTE — Progress Notes (Signed)
No egg or soy allergy known to patient  No issues with past sedation with any surgeries  or procedures, no intubation problems  No diet pills per patient No home 02 use per patient  No blood thinners per patient  Patient takes stool softeners  For constipation on occasion, this is a life long issue, last colonoscopy with  Good results No A fib or A flutter  EMMI video offered and declined by the patient.

## 2017-08-18 ENCOUNTER — Encounter: Payer: Self-pay | Admitting: Gastroenterology

## 2017-08-18 ENCOUNTER — Ambulatory Visit (AMBULATORY_SURGERY_CENTER): Payer: Medicare Other | Admitting: Gastroenterology

## 2017-08-18 VITALS — BP 117/62 | HR 70 | Temp 99.3°F | Resp 14 | Ht 69.0 in | Wt 237.0 lb

## 2017-08-18 DIAGNOSIS — D127 Benign neoplasm of rectosigmoid junction: Secondary | ICD-10-CM

## 2017-08-18 DIAGNOSIS — K635 Polyp of colon: Secondary | ICD-10-CM

## 2017-08-18 DIAGNOSIS — Z1211 Encounter for screening for malignant neoplasm of colon: Secondary | ICD-10-CM | POA: Diagnosis not present

## 2017-08-18 DIAGNOSIS — Z800 Family history of malignant neoplasm of digestive organs: Secondary | ICD-10-CM

## 2017-08-18 MED ORDER — SODIUM CHLORIDE 0.9 % IV SOLN: 500.0000 mL | Freq: Once | INTRAVENOUS | Status: DC

## 2017-08-18 NOTE — Patient Instructions (Signed)
Handouts given on polyps and hemorrhoids   YOU HAD AN ENDOSCOPIC PROCEDURE TODAY AT THE [facility]:   Refer to the procedure report that was given to you for any specific questions about what was found during the examination.  If the procedure report does not answer your questions, please call your gastroenterologist to clarify.  If you requested that your care partner not be given the details of your procedure findings, then the procedure report has been included in a sealed envelope for you to review at your convenience later.  YOU SHOULD EXPECT: Some feelings of bloating in the abdomen. Passage of more gas than usual.  Walking can help get rid of the air that was put into your GI tract during the procedure and reduce the bloating. If you had a lower endoscopy (such as a colonoscopy or flexible sigmoidoscopy) you may notice spotting of blood in your stool or on the toilet paper. If you underwent a bowel prep for your procedure, you may not have a normal bowel movement for a few days.  Please Note:  You might notice some irritation and congestion in your nose or some drainage.  This is from the oxygen used during your procedure.  There is no need for concern and it should clear up in a day or so.  SYMPTOMS TO REPORT IMMEDIATELY:   Following lower endoscopy (colonoscopy or flexible sigmoidoscopy):  Excessive amounts of blood in the stool  Significant tenderness or worsening of abdominal pains  Swelling of the abdomen that is new, acute  Fever of 100F or higher    For urgent or emergent issues, a gastroenterologist can be reached at any hour by calling [phone].   DIET:  We do recommend a small meal at first, but then you may proceed to your regular diet.  Drink plenty of fluids but you should avoid alcoholic beverages for 24 hours.  ACTIVITY:  You should plan to take it easy for the rest of today and you should NOT DRIVE or use heavy machinery until tomorrow (because of  the sedation medicines used during the test).    FOLLOW UP: Our staff will call the number listed on your records the next business day following your procedure to check on you and address any questions or concerns that you may have regarding the information given to you following your procedure. If we do not reach you, we will leave a message.  However, if you are feeling well and you are not experiencing any problems, there is no need to return our call.  We will assume that you have returned to your regular daily activities without incident.  If any biopsies were taken you will be contacted by phone or by letter within the next 1-3 weeks.  Please call us at [phone] if you have not heard about the biopsies in 3 weeks.    SIGNATURES/CONFIDENTIALITY: You and/or your care partner have signed paperwork which will be entered into your electronic medical record.  These signatures attest to the fact that that the information above on your After Visit Summary has been reviewed and is understood.  Full responsibility of the confidentiality of this discharge information lies with you and/or your care-partner. 

## 2017-08-18 NOTE — Op Note (Signed)
[facility] Patient Name: Tim Nelson Procedure Date: 08/18/2017 2:22 PM MRN: [id number] Endoscopist: Remo Lipps P. Havery Moros , MD Age: 42 Referring MD:  Date of Birth: [birth year] Gender: Male Account #: [id number] Procedure:                Colonoscopy Indications:              Screening in patient at increased risk: Family                            history of 1st-degree relative with colorectal                            cancer (brother diagnosed age 61s) Medicines:                Monitored Anesthesia Care Procedure:                Pre-Anesthesia Assessment:                           - Prior to the procedure, a History and Physical                            was performed, and patient medications and                            allergies were reviewed. The patient's tolerance of                            previous anesthesia was also reviewed. The risks                            and benefits of the procedure and the sedation                            options and risks were discussed with the patient.                            All questions were answered, and informed consent                            was obtained. Prior Anticoagulants: The patient has                            taken no previous anticoagulant or antiplatelet                            agents. ASA Grade Assessment: II - A patient with                            mild systemic disease. After reviewing the risks                            and benefits, the patient was deemed in  satisfactory condition to undergo the procedure.                           After obtaining informed consent, the colonoscope                            was passed under direct vision. Throughout the                            procedure, the patient's blood pressure, pulse, and                            oxygen saturations were monitored continuously. The                            Colonoscope was introduced  through the anus and                            advanced to the the cecum, identified by                            appendiceal orifice and ileocecal valve. The                            colonoscopy was performed without difficulty. The                            patient tolerated the procedure well. The quality                            of the bowel preparation was good. The ileocecal                            valve, appendiceal orifice, and rectum were                            photographed. Scope In: 2:30:08 PM Scope Out: 2:45:40 PM Scope Withdrawal Time: 0 hours 14 minutes 4 seconds  Total Procedure Duration: 0 hours 15 minutes 32 seconds  Findings:                 The perianal and digital rectal examinations were                            normal.                           A 2 to 3 mm polyp was found in the recto-sigmoid                            colon. The polyp was sessile. The polyp was removed                            with a cold snare. Resection and retrieval were  complete.                           Internal hemorrhoids were found during retroflexion.                           The exam was otherwise without abnormality. Complications:            No immediate complications. Estimated blood loss:                            Minimal. Estimated Blood Loss:     Estimated blood loss was minimal. Impression:               - One 2 to 3 mm polyp at the recto-sigmoid colon,                            removed with a cold snare. Resected and retrieved.                           - Internal hemorrhoids.                           - The examination was otherwise normal. Recommendation:           - Patient has a contact number available for                            emergencies. The signs and symptoms of potential                            delayed complications were discussed with the                            patient. Return to normal activities tomorrow.                             Written discharge instructions were provided to the                            patient.                           - Resume previous diet.                           - Continue present medications.                           - Await pathology results.                           - Repeat colonoscopy in 5 years for screening                            purposes. Remo Lipps P. Armbruster, MD 08/18/2017 2:49:21 PM This report has been signed electronically.

## 2017-08-18 NOTE — Progress Notes (Signed)
Pt's states no medical or surgical changes since previsit or office visit. 

## 2017-08-18 NOTE — Progress Notes (Signed)
Called to room to assist during endoscopic procedure.  Patient ID and intended procedure confirmed with present staff. Received instructions for my participation in the procedure from the performing physician.  

## 2017-08-18 NOTE — Progress Notes (Signed)
To PACU, VSS. Report to [email or web address] 

## 2017-08-19 ENCOUNTER — Telehealth: Payer: Self-pay

## 2017-08-19 NOTE — Telephone Encounter (Signed)
  Follow up Call-  Call back number 08/18/2017  Post procedure Call Back phone  # [phone]  Permission to leave phone message Yes  Some recent data might be hidden     Patient questions:  Do you have a fever, pain , or abdominal swelling? No. Pain Score  0 *  Have you tolerated food without any problems? Yes.    Have you been able to return to your normal activities? Yes.    Do you have any questions about your discharge instructions: Diet   No. Medications  No. Follow up visit  No.  Do you have questions or concerns about your Care? No.  Actions: * If pain score is 4 or above: No action needed, pain <4.

## 2017-08-21 ENCOUNTER — Encounter: Payer: Self-pay | Admitting: Gastroenterology

## 2018-02-16 DIAGNOSIS — R351 Nocturia: Secondary | ICD-10-CM | POA: Insufficient documentation

## 2018-09-15 DIAGNOSIS — F840 Autistic disorder: Secondary | ICD-10-CM | POA: Insufficient documentation

## 2019-03-11 DIAGNOSIS — F39 Unspecified mood [affective] disorder: Secondary | ICD-10-CM | POA: Insufficient documentation

## 2019-05-12 ENCOUNTER — Ambulatory Visit: Payer: Medicare Other | Attending: Internal Medicine

## 2019-05-12 DIAGNOSIS — Z23 Encounter for immunization: Secondary | ICD-10-CM

## 2019-05-12 NOTE — Progress Notes (Signed)
   Covid-19 Vaccination Clinic  Name:  Tim Nelson    MRN: [id number] DOB: 30-Jan-1976  05/12/2019  [name] was observed post Covid-19 immunization for 30 minutes based on pre-vaccination screening without incident. He was provided with Vaccine Information Sheet and instruction to access the V-Safe system.   [name] was instructed to call 911 with any severe reactions post vaccine: [name] Kitchen Difficulty breathing  . Swelling of face and throat  . A fast heartbeat  . A bad rash all over body  . Dizziness and weakness   Immunizations Administered    Name Date Dose VIS Date Route   Moderna COVID-19 Vaccine 05/12/2019 10:04 AM 0.5 mL 01/04/2019 Intramuscular   Manufacturer: Moderna   Lot: [id number]   [id number]

## 2019-05-26 ENCOUNTER — Ambulatory Visit (INDEPENDENT_AMBULATORY_CARE_PROVIDER_SITE_OTHER): Payer: Medicare Other | Admitting: Nurse Practitioner

## 2019-05-26 ENCOUNTER — Encounter: Payer: Self-pay | Admitting: Nurse Practitioner

## 2019-05-26 ENCOUNTER — Other Ambulatory Visit (INDEPENDENT_AMBULATORY_CARE_PROVIDER_SITE_OTHER): Payer: Medicare Other

## 2019-05-26 ENCOUNTER — Other Ambulatory Visit: Payer: Self-pay

## 2019-05-26 VITALS — BP 116/62 | Temp 97.6°F | Ht 69.0 in | Wt 230.5 lb

## 2019-05-26 DIAGNOSIS — R7989 Other specified abnormal findings of blood chemistry: Secondary | ICD-10-CM

## 2019-05-26 DIAGNOSIS — R1310 Dysphagia, unspecified: Secondary | ICD-10-CM

## 2019-05-26 DIAGNOSIS — K219 Gastro-esophageal reflux disease without esophagitis: Secondary | ICD-10-CM

## 2019-05-26 DIAGNOSIS — K2100 Gastro-esophageal reflux disease with esophagitis, without bleeding: Secondary | ICD-10-CM | POA: Diagnosis not present

## 2019-05-26 LAB — CBC WITH DIFFERENTIAL/PLATELET
Basophils Absolute: 0.1 10*3/uL (ref 0.0–0.1)
Basophils Relative: 0.6 % (ref 0.0–3.0)
Eosinophils Absolute: 0.2 10*3/uL (ref 0.0–0.7)
Eosinophils Relative: 2.6 % (ref 0.0–5.0)
HCT: 43.4 % (ref 39.0–52.0)
Hemoglobin: 14.6 g/dL (ref 13.0–17.0)
Lymphocytes Relative: 25 % (ref 12.0–46.0)
Lymphs Abs: 2.2 10*3/uL (ref 0.7–4.0)
MCHC: 33.6 g/dL (ref 30.0–36.0)
MCV: 85.6 fl (ref 78.0–100.0)
Monocytes Absolute: 0.8 10*3/uL (ref 0.1–1.0)
Monocytes Relative: 8.8 % (ref 3.0–12.0)
Neutro Abs: 5.7 10*3/uL (ref 1.4–7.7)
Neutrophils Relative %: 63 % (ref 43.0–77.0)
Platelets: 345 10*3/uL (ref 150.0–400.0)
RBC: 5.07 Mil/uL (ref 4.22–5.81)
RDW: 13 % (ref 11.5–15.5)
WBC: 9 10*3/uL (ref 4.0–10.5)

## 2019-05-26 LAB — C-REACTIVE PROTEIN: CRP: <1 mg/dL (ref 0.5–20.0)

## 2019-05-26 MED ORDER — OMEPRAZOLE 40 MG PO CPDR: 40.0000 mg | DELAYED_RELEASE_CAPSULE | Freq: Two times a day (BID) | ORAL | 1 refills | Status: DC

## 2019-05-26 NOTE — Progress Notes (Signed)
Tim Nelson, pls contact the lab to verify status of CMP that was ordered at the time of his office visit today, if the CMP was not done, pt will need to go back to the lab to have done.

## 2019-05-26 NOTE — Patient Instructions (Signed)
If you are age 41 or older, your body mass index should be between 23-30. Your Body mass index is 34.04 kg/m. If this is out of the aforementioned range listed, please consider follow up with your Primary Care Provider.  If you are age 57 or younger, your body mass index should be between 19-25. Your Body mass index is 34.04 kg/m. If this is out of the aformentioned range listed, please consider follow up with your Primary Care Provider.  Your provider has requested that you go to the basement level for lab work before leaving today. Press "B" on the elevator. The lab is located at the first door on the left as you exit the elevator.  Follow up in office 2-3 weeks after your EGD to review your results.  Stop taking Pantoprazole and pick up a new prescription for omeprazole 40 mg take 1 tablet twice daily.   Due to recent changes in healthcare laws, you may see the results of your imaging and laboratory studies on MyChart before your provider has had a chance to review them.  We understand that in some cases there may be results that are confusing or concerning to you. Not all laboratory results come back in the same time frame and the provider may be waiting for multiple results in order to interpret others.  Please give [place] 48 hours in order for your provider to thoroughly review all the results before contacting the office for clarification of your results.   Thank you for choosing [facility] Noralyn Pick, CRNP

## 2019-05-26 NOTE — Progress Notes (Signed)
Agree with assessment and plan as outlined. Will await EGD results.  Colleen agree with repeating LFTs and will need further workup for these if they are persistently elevated. Of note, I see they drew the CBC at the lab but missed the CMET. Can you make sure he goes back to the lab to have this drawn. Thanks

## 2019-05-26 NOTE — Progress Notes (Signed)
05/26/2019 Tim Nelson [id number] 21-Feb-1975   Chief Complaint: GERD, difficult swallowing   History of Present Illness:  Tim Nelson. Tim Nelson old male with a past medical history, hypertension, chronic scrotal edema followed by urology, hepatic steatosis, GERD, esophageal stricture and colon polyps.  He presents today accompanied by his Sister Tim Nelson.  He complains of having worsening reflux symptoms for the past 3 to 4 months.  He has frequent heartburn.  His dysphagia which is progressively worsened over the past few months.  He describes having food such as pizza, cream potatoes or chicken that gets stuck to his upper esophagus which occurs 2-3 times weekly.  He sometimes gags to expel the stuck food and other times he is able to drink water and the food passes down.  He has epigastric pressure discomfort after eating, the pressure radiates up into his esophagus which causes tightness which is somewhat relieved after he burps.  He denies having any chest pain unrelated to eating.  He has some shortness of breath when he is having significant epigastric pressure.  Nonproductive cough at times.  He is on pantoprazole 40 mg p.o. twice daily which he stated is no longer working.  He takes Gaviscon and Tums as needed.  His most recent EGD was 03/28/2013 was normal and a Bravo pH capsule was placed.  He has a history of esophageal stricture which was last dilated with [facility] [place] dilator 18 and 87m at the time of an EGD with Dr. KDeatra Ina  His most recent colonoscopy was done by Dr. AHavery Moroson 08/18/2017 and one  2-331mhyperplastic polyp was removed from the rectosigmoid colon.  He was advised to repeat a colonoscopy in 5years due to his brother had colon cancer.  He reports passing normal formed brown bowel movement daily.  No rectal bleeding or melena.  No NSAID use.  No alcohol use.  In reviewing his labs in care everywhere he demonstrates a history of elevated LFTs.  Laboratory studies 09/15/2018 showed  AST 123.  ALT 132.  Total bili 0.6.  Alk phos 60. 01/22/2018 AST 53.  ALT 71. 10/29/2017 Hepatitis A IgM negative. Hepatitis B surface antigen negative.  Hepatitis B core IgM antibody negative.  Hepatitis C antibody less than 0.1.  CT abdomen of the pelvis 05/29/2014: Liver steatosis.  Superficial scrotal edema.   Past Medical History:  Diagnosis Date  . Allergy   . Anxiety and depression   . Autism   . Dysthymic disorder   . Esophageal stricture   . Esophagitis, unspecified   . GERD (gastroesophageal reflux disease)   . History of chronic cough    dry cough-sleeps with elevated head of bed.  . [name] Kitchenx of colonic polyps   . Hyperlipemia   . Hypertension   . Seasonal allergies    Current Outpatient Medications on File Prior to Visit  Medication Sig Dispense Refill  . acetaminophen (TYLENOL) 500 MG tablet Take 1,000 mg by mouth every 6 (six) hours as needed for mild pain or moderate pain.     . [name] Kitchenluminum hydroxide-magnesium carbonate (GAVISCON) 95-358 MG/15ML SUSP Take 15 mLs by mouth as needed.    . [name] Kitchentorvastatin (LIPITOR) 10 MG tablet Take 10 mg by mouth every evening.     . Ca Carbonate-Mag Hydroxide (ROLAIDS) 550-110 MG CHEW Chew 1 tablet by mouth. Reported on 04/19/2015    . fenofibrate micronized (LOFIBRA) 134 MG capsule Take 134 mg by mouth daily before breakfast.    . fexofenadine (ALLEGRA) 180 MG  tablet Take 180 mg by mouth daily.    . hydrochlorothiazide (HYDRODIURIL) 25 MG tablet Take 25 mg by mouth 2 (two) times daily.    [name] Kitchen lisinopril (ZESTRIL) 5 MG tablet Take 5 mg by mouth daily.    . pantoprazole (PROTONIX) 40 MG tablet TAKE ONE TABLET BY MOUTH TWICE DAILY    . sertraline (ZOLOFT) 50 MG tablet Take 100 mg by mouth every morning.     . simethicone (MYLICON) 219 MG chewable tablet Chew 125 mg by mouth every 6 (six) hours as needed for flatulence (as needed.).     Current Facility-Administered Medications on File Prior to Visit  Medication Dose Route Frequency Provider Last Rate  Last Admin  . 0.9 %  sodium chloride infusion  500 mL Intravenous Once Armbruster, Carlota Raspberry, MD        Allergies  Allergen Reactions  . Cefaclor Shortness Of Breath and Swelling  . Dimetapp Dm Cold-Cough [Phenylephrine-Bromphen-Dm] Shortness Of Breath and Swelling  . Furosemide Rash     Current Medications, Allergies, Past Medical History, Past Surgical History, Family History and Social History were reviewed in [employer] record.   Physical Exam: BP 116/62   Temp 97.6 F (36.4 C)   Ht '5\' 9"'$  (1.753 m)   Wt 230 lb 8 oz (104.6 kg)   BMI 34.04 kg/m    General: Well developed 44 year old male in no acute distress. Head: Normocephalic and atraumatic. Eyes: No scleral icterus. Conjunctiva pink . Ears: Normal auditory acuity. Mouth: Dentition intact. No ulcers or lesions.  Lungs: Clear throughout to auscultation. Heart: Regular rate and rhythm, no murmur. Abdomen: Soft, nondistended. Mild epigastric tenderness and a mild tenderness at the umbilicus.  No masses or hepatomegaly. Normal bowel sounds x 4 quadrants.  Rectal: Deferred.  Musculoskeletal: Symmetrical with no gross deformities. Extremities: No edema. Neurological: Alert oriented x 4. No focal deficits.  Psychological: Alert and cooperative. Normal mood and affect  Assessment and Recommendations:  27.  44 year old male with mild autism with a history of GERD and esophageal stricture presents with worsening reflux symptoms and dysphagia. -Stop Pantoprazole  -Start Omeprazole 40 mg p.o. twice daily -EGD with possible esophageal dilatation benefits and risks discussed including risk with sedation, risk of bleeding, perforation and infection  -Gaviscon and TUMS PRN -Patient to call our office if his symptoms worsen -Patient to schedule follow-up appointment in office 2 to 3 weeks after EGD completed  2.  Hepatic steatosis with elevated LFTs -CMP, CBC -Further hepatology follow-up to be pursued  after EGD completed  3.  Personal history of hyperplastic colon polyps.  Family history of colon cancer (brother) -Next colonoscopy due September 2024

## 2019-05-27 ENCOUNTER — Telehealth: Payer: Self-pay | Admitting: General Surgery

## 2019-05-27 ENCOUNTER — Other Ambulatory Visit: Payer: Self-pay

## 2019-05-27 ENCOUNTER — Other Ambulatory Visit (INDEPENDENT_AMBULATORY_CARE_PROVIDER_SITE_OTHER): Payer: Medicare Other

## 2019-05-27 DIAGNOSIS — R7989 Other specified abnormal findings of blood chemistry: Secondary | ICD-10-CM

## 2019-05-27 DIAGNOSIS — K219 Gastro-esophageal reflux disease without esophagitis: Secondary | ICD-10-CM

## 2019-05-27 LAB — COMPREHENSIVE METABOLIC PANEL
ALT: 39 U/L (ref 0–53)
AST: 33 U/L (ref 0–37)
Albumin: 4.6 g/dL (ref 3.5–5.2)
Alkaline Phosphatase: 58 U/L (ref 39–117)
BUN: 27 mg/dL — ABNORMAL HIGH (ref 6–23)
CO2: 25 mEq/L (ref 19–32)
Calcium: 10 mg/dL (ref 8.4–10.5)
Chloride: 102 mEq/L (ref 96–112)
Creatinine, Ser: 1.24 mg/dL (ref 0.40–1.50)
GFR: 64.06 mL/min (ref >60.00)
Glucose, Bld: 105 mg/dL — ABNORMAL HIGH (ref 70–99)
Potassium: 3.9 mEq/L (ref 3.5–5.1)
Sodium: 140 mEq/L (ref 135–145)
Total Bilirubin: 0.4 mg/dL (ref 0.2–1.2)
Total Protein: 7.4 g/dL (ref 6.0–8.3)

## 2019-05-27 NOTE — Telephone Encounter (Signed)
-----   Message from Noralyn Pick, NP sent at 05/26/2019  7:24 PM EDT -----   ----- Message ----- From: Yetta Flock, MD Sent: 05/26/2019   6:18 PM EDT To: Noralyn Pick, NP    ----- Message ----- From: Noralyn Pick, NP Sent: 05/26/2019   3:06 PM EDT To: Yetta Flock, MD

## 2019-05-27 NOTE — Telephone Encounter (Signed)
Contacted [employer] and spoke with Santiago Glad. They are able to add a cmet on, faxed add on sheet.

## 2019-05-30 ENCOUNTER — Telehealth: Payer: Self-pay | Admitting: Nurse Practitioner

## 2019-05-30 NOTE — Telephone Encounter (Signed)
Tim Nelson, pls call patient's sister and I would advise for pt to stop Omeprazole. Pls try Famotidine 20mg  one tab po once daily and call me in 1 week with an updated. Ok to send RX for Famotidine 20mg  one po Q Day # 30, 1 refill. thx

## 2019-05-30 NOTE — Telephone Encounter (Signed)
Pt's sister Rosann Auerbach called to inform that pt has been experiencing diarrhea since he began taking prilosec. She would like some advise on what to do. Pls call her at [phone].

## 2019-05-31 ENCOUNTER — Other Ambulatory Visit: Payer: Self-pay

## 2019-05-31 ENCOUNTER — Telehealth: Payer: Medicare Other

## 2019-05-31 ENCOUNTER — Other Ambulatory Visit (HOSPITAL_COMMUNITY): Payer: Medicare Other

## 2019-05-31 MED ORDER — FAMOTIDINE 20 MG PO TABS
20.0000 mg | ORAL_TABLET | Freq: Every day | ORAL | 1 refills | Status: DC
Start: 2019-05-31 — End: 2021-04-24

## 2019-05-31 NOTE — Telephone Encounter (Signed)
Spoke with the patient and his mother regarding stopping his omeprazole and starting the famotidine. Patient and his mother verbalized understanding and stated that the pharmacy has already notified them of the famotadine prescription

## 2019-06-01 ENCOUNTER — Other Ambulatory Visit: Payer: Self-pay

## 2019-06-01 ENCOUNTER — Other Ambulatory Visit (HOSPITAL_COMMUNITY)
Admission: RE | Admit: 2019-06-01 | Discharge: 2019-06-01 | Disposition: A | Discharge status: Home / Self Care | Payer: Medicare Other | Source: Ambulatory Visit | Attending: Gastroenterology | Admitting: Gastroenterology

## 2019-06-01 DIAGNOSIS — Z20822 Contact with and (suspected) exposure to covid-19: Secondary | ICD-10-CM | POA: Insufficient documentation

## 2019-06-01 DIAGNOSIS — Z01812 Encounter for preprocedural laboratory examination: Secondary | ICD-10-CM | POA: Insufficient documentation

## 2019-06-01 NOTE — Telephone Encounter (Signed)
See lab notes

## 2019-06-02 ENCOUNTER — Encounter (HOSPITAL_COMMUNITY): Payer: Self-pay | Admitting: Psychiatry

## 2019-06-02 ENCOUNTER — Telehealth (INDEPENDENT_AMBULATORY_CARE_PROVIDER_SITE_OTHER): Payer: Medicare Other | Admitting: Psychiatry

## 2019-06-02 DIAGNOSIS — F411 Generalized anxiety disorder: Secondary | ICD-10-CM | POA: Insufficient documentation

## 2019-06-02 DIAGNOSIS — F0630 Mood disorder due to known physiological condition, unspecified: Secondary | ICD-10-CM | POA: Diagnosis not present

## 2019-06-02 LAB — SARS CORONAVIRUS 2 (TAT 6-24 HRS): SARS Coronavirus 2: NEGATIVE

## 2019-06-02 MED ORDER — LAMOTRIGINE 25 MG PO TABS
25.0000 mg | ORAL_TABLET | Freq: Every day | ORAL | 0 refills | Status: DC
Start: 2019-06-02 — End: 2019-06-30

## 2019-06-02 NOTE — Progress Notes (Signed)
Psychiatric Initial Adult Assessment   Patient Identification: Tim Nelson MRN:  [id number] Date of Evaluation:  06/02/2019 Referral Source: primary care Chief Complaint:   Chief Complaint    Establish Care     Visit Diagnosis:    ICD-10-CM   1. Mood disorder in conditions classified elsewhere  F06.30   2. GAD (generalized anxiety disorder)  F41.1   I connected with [name] on 06/02/19 at  9:00 AM EDT by a video enabled telemedicine application and verified that I am speaking with the correct person using two identifiers. Video was having echo so did mix of audio /video appointment    I discussed the limitations of evaluation and management by telemedicine and the availability of in person appointments. The patient expressed understanding and agreed to proceed.  History of Present Illness:  44 years old white male single lives with sister, mom brother in law Diagnosed with developmental delay, autism, speech problems referred for mood symtoms  Have seen Dr. Theda Sers and staff [facility] office managed with abilify for more then 20 years for mood symptoms and zoloft for depression anxiety. After Dr. Theda Sers passed away and staff retired took med from PCP  Family noticed tremors twitches and later abilify was stopped, improved denies tremors or visible movements. pcp doing other labs as well and referred Increased zoloft caused headaches  Presents with sister , she describes he gets agitated , moody , then withdraws does not like change, easily gets upset since abilify not on board.  Also was taking care of mom and now sister is getting involved with his living and helping him out which is a change for him as well Does worry and gets anxious excessive when have to do a procedure or go out in unfamiliar settings.  Avoids public, was not able to continue work with having mood symptoms and got agitated  At home does fine unless some change or rules not followed and gets  edgy. No psychosis, no suicidal thoughts  Denies drug use  Aggravating factor: change, public, decreased executive functioning Modifying factor: sister, mom  Duration since childhood      Past Psychiatric History: autism, anxiety  Previous Psychotropic Medications: Yes   Substance Abuse History in the last 12 months:  No.  Consequences of Substance Abuse: NA  Past Medical History:  Past Medical History:  Diagnosis Date  . Allergy   . Anxiety and depression   . Autism   . Dysthymic disorder   . Esophageal stricture   . Esophagitis, unspecified   . GERD (gastroesophageal reflux disease)   . History of chronic cough    dry cough-sleeps with elevated head of bed.  [name] Kitchen Hx of colonic polyps   . Hyperlipemia   . Hypertension   . Seasonal allergies     Past Surgical History:  Procedure Laterality Date  . BRAVO [place] STUDY N/A 03/28/2013   Procedure: BRAVO [place] STUDY;  Surgeon: Inda Castle, MD;  Location: [facility];  Service: Endoscopy;  Laterality: N/A;  . COLONSCOPY  2014   hx. polyps with past testing  . DENTAL SURGERY    . ESOPHAGEAL MANOMETRY N/A 04/25/2013   Procedure: ESOPHAGEAL MANOMETRY (EM);  Surgeon: Inda Castle, MD;  Location: [facility];  Service: Endoscopy;  Laterality: N/A;  . ESOPHAGOGASTRODUODENOSCOPY N/A 03/28/2013   Procedure: ESOPHAGOGASTRODUODENOSCOPY (EGD);  Surgeon: Inda Castle, MD;  Location: [facility];  Service: Endoscopy;  Laterality: N/A;  . HERNIA REPAIR  AS INFANT   groin  Family Psychiatric History: denies  Family History:  Family History  Problem Relation Age of Onset  . Colon cancer Brother 31  . Irritable bowel syndrome Mother   . Colon polyps Mother   . Diabetes Mother   . Kidney cancer Mother   . Colon cancer Maternal Grandfather 11  . Esophageal cancer Maternal Grandfather   . Liver cancer Brother     Social History:   Social History   Socioeconomic History  . Marital status: Single    Spouse name: Not  on file  . Number of children: Not on file  . Years of education: Not on file  . Highest education level: Not on file  Occupational History  . Occupation: Unemployed   Tobacco Use  . Smoking status: Never Smoker  . Smokeless tobacco: Never Used  Substance and Sexual Activity  . Alcohol use: No  . Drug use: No  . Sexual activity: Not on file  Other Topics Concern  . Not on file  Social History Narrative  . Not on file   Social Determinants of Health   Financial Resource Strain:   . Difficulty of Paying Living Expenses:   Food Insecurity:   . Worried About Charity fundraiser in the Last Year:   . Arboriculturist in the Last Year:   Transportation Needs:   . Film/video editor (Medical):   [name] Kitchen Lack of Transportation (Non-Medical):   Physical Activity:   . Days of Exercise per Week:   . Minutes of Exercise per Session:   Stress:   . Feeling of Stress :   Social Connections:   . Frequency of Communication with Friends and Family:   . Frequency of Social Gatherings with Friends and Family:   . Attends Religious Services:   . Active Member of Clubs or Organizations:   . Attends Archivist Meetings:   [name] Kitchen Marital Status:     Additional Social History: grew up grand ma . Parents were seperated, no trauma. Had speech delays, did finish high school.   Allergies:   Allergies  Allergen Reactions  . Cefaclor Shortness Of Breath and Swelling  . Dimetapp Dm Cold-Cough [Phenylephrine-Bromphen-Dm] Shortness Of Breath and Swelling  . Furosemide Rash    Metabolic Disorder Labs: No results found for: HGBA1C, MPG No results found for: PROLACTIN No results found for: CHOL, TRIG, HDL, CHOLHDL, VLDL, LDLCALC No results found for: TSH  Therapeutic Level Labs: No results found for: LITHIUM No results found for: CBMZ No results found for: VALPROATE  Current Medications: Current Outpatient Medications  Medication Sig Dispense Refill  . acetaminophen (TYLENOL) 500 MG  tablet Take 1,000 mg by mouth every 6 (six) hours as needed for mild pain or moderate pain.     [name] Kitchen aluminum hydroxide-magnesium carbonate (GAVISCON) 95-358 MG/15ML SUSP Take 15 mLs by mouth as needed.    [name] Kitchen atorvastatin (LIPITOR) 10 MG tablet Take 10 mg by mouth every evening.     . Ca Carbonate-Mag Hydroxide (ROLAIDS) 550-110 MG CHEW Chew 1 tablet by mouth. Reported on 04/19/2015    . famotidine (PEPCID) 20 MG tablet Take 1 tablet (20 mg total) by mouth daily. 30 tablet 1  . fenofibrate micronized (LOFIBRA) 134 MG capsule Take 134 mg by mouth daily before breakfast.    . fexofenadine (ALLEGRA) 180 MG tablet Take 180 mg by mouth daily.    . hydrochlorothiazide (HYDRODIURIL) 25 MG tablet Take 25 mg by mouth 2 (two) times daily.    [name] Kitchen  lamoTRIgine (LAMICTAL) 25 MG tablet Take 1 tablet (25 mg total) by mouth daily. Take one tablet daily for a week and then start taking 2 tablets. 60 tablet 0  . lisinopril (ZESTRIL) 5 MG tablet Take 5 mg by mouth daily.    [name] Kitchen omeprazole (PRILOSEC) 40 MG capsule Take 1 capsule (40 mg total) by mouth in the morning and at bedtime. 180 capsule 1  . pantoprazole (PROTONIX) 40 MG tablet TAKE ONE TABLET BY MOUTH TWICE DAILY    . sertraline (ZOLOFT) 50 MG tablet Take 100 mg by mouth every morning.     . simethicone (MYLICON) 0000000 MG chewable tablet Chew 125 mg by mouth every 6 (six) hours as needed for flatulence (as needed.).     Current Facility-Administered Medications  Medication Dose Route Frequency Provider Last Rate Last Admin  . 0.9 %  sodium chloride infusion  500 mL Intravenous Once Armbruster, Carlota Raspberry, MD           Psychiatric Specialty Exam: Review of Systems  Cardiovascular: Negative for chest pain.  Psychiatric/Behavioral: Negative for decreased concentration and hallucinations.    There were no vitals taken for this visit.There is no height or weight on file to calculate BMI.  General Appearance: Casual  Eye Contact:  Fair  Speech:  Slow  Volume:   Decreased  Mood:  constricted  Affect:  Congruent  Thought Process:  Goal Directed  Orientation:  Full (Time, Place, and Person)  Thought Content:  Rumination  Suicidal Thoughts:  No  Homicidal Thoughts:  No  Memory:  Immediate;   Fair Recent;   Fair  Judgement:  Fair  Insight:  Shallow  Psychomotor Activity:  Decreased  Concentration:  Concentration: Fair and Attention Span: Fair  Recall:  [employer] of Knowledge:Fair  Language: Fair  Akathisia:  No  Handed:  AIMS (if indicated):    Assets:  Desire for Improvement Social Support  ADL's:  Intact  Cognition: present  Sleep:  Fair   Screenings:   Assessment and Plan: as follows  Mood disorder unspecified: start lamictal 25mg  increase to 50mg  in one week GAD: continue zoloft 100mg  Discussed rash if any to call or stop medication  Patient has difficulty with change, discussed with sister to have small rules he gets used to it and adapt and learn with care giving and activities.   I discussed the assessment and treatment plan with the patient. The patient was provided an opportunity to ask questions and all were answered. The patient agreed with the plan and demonstrated an understanding of the instructions.   The patient was advised to call back or seek an in-person evaluation if the symptoms worsen or if the condition fails to improve as anticipated. Fu 2-3 weeks  I provided 40  minutes of non-face-to-face time during this encounter. Merian Capron, MD 4/29/20219:22 AM

## 2019-06-03 ENCOUNTER — Ambulatory Visit (AMBULATORY_SURGERY_CENTER): Payer: Medicare Other | Admitting: Gastroenterology

## 2019-06-03 ENCOUNTER — Encounter: Payer: Self-pay | Admitting: Gastroenterology

## 2019-06-03 ENCOUNTER — Other Ambulatory Visit: Payer: Self-pay

## 2019-06-03 VITALS — BP 111/64 | HR 70 | Temp 97.3°F | Resp 22 | Ht 69.0 in | Wt 230.0 lb

## 2019-06-03 DIAGNOSIS — K319 Disease of stomach and duodenum, unspecified: Secondary | ICD-10-CM

## 2019-06-03 DIAGNOSIS — K219 Gastro-esophageal reflux disease without esophagitis: Secondary | ICD-10-CM

## 2019-06-03 DIAGNOSIS — R1310 Dysphagia, unspecified: Secondary | ICD-10-CM

## 2019-06-03 MED ORDER — SODIUM CHLORIDE 0.9 % IV SOLN: 500.0000 mL | Freq: Once | INTRAVENOUS | Status: DC

## 2019-06-03 NOTE — Progress Notes (Signed)
Called to room to assist during endoscopic procedure.  Patient ID and intended procedure confirmed with present staff. Received instructions for my participation in the procedure from the performing physician.  

## 2019-06-03 NOTE — Op Note (Signed)
[facility] Patient Name: Tim Nelson Procedure Date: 06/03/2019 10:09 AM MRN: [id number] Endoscopist: Remo Lipps P. Havery Moros , MD Age: 44 Referring MD:  Date of Birth: 1975-07-14 Gender: Male Account #: [id number] Procedure:                Upper GI endoscopy Indications:              Dysphagia, Follow-up of gastro-esophageal reflux                            disease - on protonix with ongoing symptoms,                            switched to omeprazole 40mg  BID recently - reports                            benefit to dilation in 2012 with Dr. Deatra Ina Medicines:                Monitored Anesthesia Care Procedure:                Pre-Anesthesia Assessment:                           - Prior to the procedure, a History and Physical                            was performed, and patient medications and                            allergies were reviewed. The patient's tolerance of                            previous anesthesia was also reviewed. The risks                            and benefits of the procedure and the sedation                            options and risks were discussed with the patient.                            All questions were answered, and informed consent                            was obtained. Prior Anticoagulants: The patient has                            taken no previous anticoagulant or antiplatelet                            agents. ASA Grade Assessment: II - A patient with                            mild systemic disease. After reviewing the risks  and benefits, the patient was deemed in                            satisfactory condition to undergo the procedure.                           After obtaining informed consent, the endoscope was                            passed under direct vision. Throughout the                            procedure, the patient's blood pressure, pulse, and                            oxygen  saturations were monitored continuously. The                            Endoscope was introduced through the mouth, and                            advanced to the second part of duodenum. The upper                            GI endoscopy was accomplished without difficulty.                            The patient tolerated the procedure well. Scope In: Scope Out: Findings:                 Esophagogastric landmarks were identified: the                            Z-line was found at 40 cm, the gastroesophageal                            junction was found at 40 cm and the upper extent of                            the gastric folds was found at 40 cm from the                            incisors.                           The exam of the esophagus was otherwise normal. No                            obvious stricture / stenosis                           A guidewire was placed and the scope was withdrawn.  Empiric dilation was performed in the entire                            esophagus with a Savary dilator with mild                            resistance at 17 mm and 18 mm. [place] endoscopy                            showed no mucosal wrents. Biopsies were taken with                            a cold forceps in the upper third of the esophagus,                            in the middle third of the esophagus and in the                            lower third of the esophagus for histology.                           A small lipoma was noted in the distal gastric                            body. The entire examined stomach was otherwise                            normal.                           The duodenal bulb and second portion of the                            duodenum were normal. Complications:            No immediate complications. Estimated blood loss:                            Minimal. Estimated Blood Loss:     Estimated blood loss was minimal. Impression:                - Esophagogastric landmarks identified.                           - Normal esophagus otherwise - empiric dilation                            performed and then biopsies obtained to ensure no                            evidence of EoE.                           - Small benign gastric lipoma                           -  Normal stomach.                           - Normal duodenal bulb and second portion of the                            duodenum. Recommendation:           - Patient has a contact number available for                            emergencies. The signs and symptoms of potential                            delayed complications were discussed with the                            patient. Return to normal activities tomorrow.                            Written discharge instructions were provided to the                            patient.                           - Resume previous diet.                           - Continue present medications.                           - Await pathology results.                           - Await course post dilation and change of PPI                            regimen Tim Nelson P. Tim Mcadams, MD 06/03/2019 10:36:01 AM This report has been signed electronically.

## 2019-06-03 NOTE — Patient Instructions (Addendum)
Follow post-dilation diet handout.  YOU HAD AN ENDOSCOPIC PROCEDURE TODAY AT [facility]:   Refer to the procedure report that was given to you for any specific questions about what was found during the examination.  If the procedure report does not answer your questions, please call your gastroenterologist to clarify.  If you requested that your care partner not be given the details of your procedure findings, then the procedure report has been included in a sealed envelope for you to review at your convenience later.  YOU SHOULD EXPECT: Some feelings of bloating in the abdomen. Passage of more gas than usual.  Walking can help get rid of the air that was put into your GI tract during the procedure and reduce the bloating. If you had a lower endoscopy (such as a colonoscopy or flexible sigmoidoscopy) you may notice spotting of blood in your stool or on the toilet paper. If you underwent a bowel prep for your procedure, you may not have a normal bowel movement for a few days.  Please Note:  You might notice some irritation and congestion in your nose or some drainage.  This is from the oxygen used during your procedure.  There is no need for concern and it should clear up in a day or so.  SYMPTOMS TO REPORT IMMEDIATELY:    Following upper endoscopy (EGD)  Vomiting of blood or coffee ground material  New chest pain or pain under the shoulder blades  Painful or persistently difficult swallowing  New shortness of breath  Fever of 100F or higher  Black, tarry-looking stools  For urgent or emergent issues, a gastroenterologist can be reached at any hour by calling [phone]. Do not use MyChart messaging for urgent concerns.    DIET:  Clear liquids for 2 hours (until 1230pm), then a Soft diet for the rest of today (see handout).  Drink plenty of fluids but you should avoid alcoholic beverages for 24 hours.  ACTIVITY:  You should plan to take it easy for the rest of today  and you should NOT DRIVE or use heavy machinery until tomorrow (because of the sedation medicines used during the test).    FOLLOW UP: Our staff will call the number listed on your records 48-72 hours following your procedure to check on you and address any questions or concerns that you may have regarding the information given to you following your procedure. If we do not reach you, we will leave a message.  We will attempt to reach you two times.  During this call, we will ask if you have developed any symptoms of COVID 19. If you develop any symptoms (ie: fever, flu-like symptoms, shortness of breath, cough etc.) before then, please call [phone].  If you test positive for Covid 19 in the 2 weeks post procedure, please call and report this information to [place].    If any biopsies were taken you will be contacted by phone or by letter within the next 1-3 weeks.  Please call us at [phone] if you have not heard about the biopsies in 3 weeks.    SIGNATURES/CONFIDENTIALITY: You and/or your care partner have signed paperwork which will be entered into your electronic medical record.  These signatures attest to the fact that that the information above on your After Visit Summary has been reviewed and is understood.  Full responsibility of the confidentiality of this discharge information lies with you and/or your care-partner.

## 2019-06-03 NOTE — Progress Notes (Signed)
To PACU, VSS. Report to [email or web address] 

## 2019-06-03 NOTE — Progress Notes (Signed)
Pt's states no medical or surgical changes since previsit or office visit.  Temp- June Vitals- Donna 

## 2019-06-07 ENCOUNTER — Telehealth: Payer: Self-pay

## 2019-06-07 NOTE — Telephone Encounter (Signed)
  Follow up Call-  Call back number 06/03/2019 08/18/2017  Post procedure Call Back phone  # [phone]  Permission to leave phone message Yes Yes  Some recent data might be hidden     Patient questions:  Do you have a fever, pain , or abdominal swelling? No. Pain Score  0 *  Have you tolerated food without any problems? Yes.    Have you been able to return to your normal activities? Yes.    Do you have any questions about your discharge instructions: Diet   No. Medications  No. Follow up visit  No.  Do you have questions or concerns about your Care? No.  Actions: * If pain score is 4 or above: No action needed, pain <4.  1. Have you developed a fever since your procedure? no  2.   Have you had an respiratory symptoms (SOB or cough) since your procedure? no  3.   Have you tested positive for COVID 19 since your procedure no  4.   Have you had any family members/close contacts diagnosed with the COVID 19 since your procedure?  no   If yes to any of these questions please route to Joylene John, RN and Erenest Rasher, RN

## 2019-06-09 ENCOUNTER — Ambulatory Visit: Payer: Medicare Other | Admitting: Physician Assistant

## 2019-06-09 ENCOUNTER — Ambulatory Visit: Payer: Medicare Other | Attending: Internal Medicine

## 2019-06-09 DIAGNOSIS — Z23 Encounter for immunization: Secondary | ICD-10-CM

## 2019-06-09 NOTE — Progress Notes (Signed)
   Covid-19 Vaccination Clinic  Name:  Tim Nelson    MRN: [id number] DOB: [birth year]  06/09/2019  [name] was observed post Covid-19 immunization for 15 minutes without incident. He was provided with Vaccine Information Sheet and instruction to access the V-Safe system.   [name] was instructed to call 911 with any severe reactions post vaccine: [name] Kitchen Difficulty breathing  . Swelling of face and throat  . A fast heartbeat  . A bad rash all over body  . Dizziness and weakness   Immunizations Administered    Name Date Dose VIS Date Route   Moderna COVID-19 Vaccine 06/09/2019 10:09 AM 0.5 mL 01/2019 Intramuscular   Manufacturer: Moderna   Lot: [id number]   [id number]

## 2019-06-20 ENCOUNTER — Telehealth: Payer: Self-pay | Admitting: Gastroenterology

## 2019-06-20 NOTE — Telephone Encounter (Signed)
Left message for patient to call back  

## 2019-06-23 NOTE — Telephone Encounter (Signed)
I spoke with his sister, caregiver.  She states that they have their questions "figured out". They will call back for any additional questions or concerns

## 2019-06-30 ENCOUNTER — Other Ambulatory Visit: Payer: Self-pay

## 2019-06-30 ENCOUNTER — Telehealth (INDEPENDENT_AMBULATORY_CARE_PROVIDER_SITE_OTHER): Payer: Medicare Other | Admitting: Psychiatry

## 2019-06-30 ENCOUNTER — Encounter (HOSPITAL_COMMUNITY): Payer: Self-pay | Admitting: Psychiatry

## 2019-06-30 DIAGNOSIS — F840 Autistic disorder: Secondary | ICD-10-CM

## 2019-06-30 DIAGNOSIS — F0630 Mood disorder due to known physiological condition, unspecified: Secondary | ICD-10-CM | POA: Diagnosis not present

## 2019-06-30 DIAGNOSIS — F411 Generalized anxiety disorder: Secondary | ICD-10-CM | POA: Diagnosis not present

## 2019-06-30 MED ORDER — QUETIAPINE FUMARATE 25 MG PO TABS: 25.0000 mg | ORAL_TABLET | Freq: Every day | ORAL | 1 refills | Status: DC

## 2019-06-30 MED ORDER — SERTRALINE HCL 100 MG PO TABS
100.0000 mg | ORAL_TABLET | Freq: Every day | ORAL | 0 refills | Status: DC
Start: 2019-06-30 — End: 2021-04-24

## 2019-06-30 NOTE — Progress Notes (Signed)
Tim Nelson Follow up visit   Patient Identification: Tim Nelson MRN:  [id number] Date of Evaluation:  06/30/2019 Referral Source: primary care Chief Complaint:   follow up mood symptoms Visit Diagnosis:    ICD-10-CM   1. Mood disorder in conditions classified elsewhere  F06.30   2. GAD (generalized anxiety disorder)  F41.1   3. Autism  F84.0    I connected with Tim Nelson on 06/30/19 at  8:30 AM EDT by a video enabled telemedicine application and verified that I am speaking with the correct person using two identifiers. Video was having echo so did mix of audio /video appointment  Patient location : home with sister Provider location : home office   I discussed the limitations of evaluation and management by telemedicine and the availability of in person appointments. The patient expressed understanding and agreed to proceed.  History of Present Illness:  44 years old white male single lives with sister, mom brother in law Diagnosed with developmental delay, autism, speech problems initially referred for mood symtoms  Have seen Dr. Theda Nelson and staff Tim Nelson office managed with abilify for more then 20 years for mood symptoms and zoloft for depression anxiety. After Dr. Theda Nelson passed away and staff retired took med from PCP  Last visit started lamictal for anger, moodiness.  Family notices some itching recently  Remains agitated with family but otherwise calm with others and listens to other people  Does worry and gets anxious excessive when have to do a procedure or go out in unfamiliar settings.  Avoids public, was not able to continue work with having mood symptoms     Aggravating factor: change, public, decreased executive functioning Modifying factor: sister, mom  Duration since childhood      Past Psychiatric History: autism, anxiety    Past Medical History:  Past Medical History:  Diagnosis Date  . Allergy   . Anxiety and depression   . Autism   .  Dysthymic disorder   . Esophageal stricture   . Esophagitis, unspecified   . GERD (gastroesophageal reflux disease)   . History of chronic cough    dry cough-sleeps with elevated head of bed.  [name] Kitchen Hx of colonic polyps   . Hyperlipemia   . Hypertension   . Seasonal allergies     Past Surgical History:  Procedure Laterality Date  . BRAVO [place] STUDY N/A 03/28/2013   Procedure: BRAVO [place] STUDY;  Surgeon: Inda Castle, MD;  Location: [facility];  Service: Endoscopy;  Laterality: N/A;  . COLONSCOPY  2014   hx. polyps with past testing  . DENTAL SURGERY    . ESOPHAGEAL MANOMETRY N/A 04/25/2013   Procedure: ESOPHAGEAL MANOMETRY (EM);  Surgeon: Inda Castle, MD;  Location: [facility];  Service: Endoscopy;  Laterality: N/A;  . ESOPHAGOGASTRODUODENOSCOPY N/A 03/28/2013   Procedure: ESOPHAGOGASTRODUODENOSCOPY (EGD);  Surgeon: Inda Castle, MD;  Location: [facility];  Service: Endoscopy;  Laterality: N/A;  . HERNIA REPAIR  AS INFANT   groin    Family Psychiatric History: denies  Family History:  Family History  Problem Relation Age of Onset  . Colon cancer Brother 78  . Irritable bowel syndrome Mother   . Colon polyps Mother   . Diabetes Mother   . Kidney cancer Mother   . Colon cancer Maternal Grandfather 37  . Esophageal cancer Maternal Grandfather   . Liver cancer Brother     Social History:   Social History   Socioeconomic History  . Marital status: Single  Spouse name: Not on file  . Number of children: Not on file  . Years of education: Not on file  . Highest education level: Not on file  Occupational History  . Occupation: Unemployed   Tobacco Use  . Smoking status: Never Smoker  . Smokeless tobacco: Never Used  Substance and Sexual Activity  . Alcohol use: No  . Drug use: No  . Sexual activity: Not on file  Other Topics Concern  . Not on file  Social History Narrative  . Not on file   Social Determinants of Health   Financial Resource Strain:   .  Difficulty of Paying Living Expenses:   Food Insecurity:   . Worried About Charity fundraiser in the Last Year:   . Arboriculturist in the Last Year:   Transportation Needs:   . Film/video editor (Medical):   [name] Kitchen Lack of Transportation (Non-Medical):   Physical Activity:   . Days of Exercise per Week:   . Minutes of Exercise per Session:   Stress:   . Feeling of Stress :   Social Connections:   . Frequency of Communication with Friends and Family:   . Frequency of Social Gatherings with Friends and Family:   . Attends Religious Services:   . Active Member of Clubs or Organizations:   . Attends Archivist Meetings:   [name] Kitchen Marital Status:       Allergies:   Allergies  Allergen Reactions  . Cefaclor Shortness Of Breath and Swelling  . Dimetapp Dm Cold-Cough [Phenylephrine-Bromphen-Dm] Shortness Of Breath and Swelling  . Furosemide Rash    Metabolic Disorder Labs: No results found for: HGBA1C, MPG No results found for: PROLACTIN No results found for: CHOL, TRIG, HDL, CHOLHDL, VLDL, LDLCALC No results found for: TSH  Therapeutic Level Labs: No results found for: LITHIUM No results found for: CBMZ No results found for: VALPROATE  Current Medications: Current Outpatient Medications  Medication Sig Dispense Refill  . acetaminophen (TYLENOL) 500 MG tablet Take 1,000 mg by mouth every 6 (six) hours as needed for mild pain or moderate pain.     [name] Kitchen aluminum hydroxide-magnesium carbonate (GAVISCON) 95-358 MG/15ML SUSP Take 15 mLs by mouth as needed.    [name] Kitchen atorvastatin (LIPITOR) 10 MG tablet Take 10 mg by mouth every evening.     . Ca Carbonate-Mag Hydroxide (ROLAIDS) 550-110 MG CHEW Chew 1 tablet by mouth. Reported on 04/19/2015    . famotidine (PEPCID) 20 MG tablet Take 1 tablet (20 mg total) by mouth daily. 30 tablet 1  . fenofibrate micronized (LOFIBRA) 134 MG capsule Take 134 mg by mouth daily before breakfast.    . fexofenadine (ALLEGRA) 180 MG tablet Take 180 mg by  mouth daily.    . hydrochlorothiazide (HYDRODIURIL) 25 MG tablet Take 25 mg by mouth 2 (two) times daily.    [name] Kitchen lisinopril (ZESTRIL) 5 MG tablet Take 5 mg by mouth daily.    [name] Kitchen omeprazole (PRILOSEC) 40 MG capsule Take 1 capsule (40 mg total) by mouth in the morning and at bedtime. 180 capsule 1  . pantoprazole (PROTONIX) 40 MG tablet TAKE ONE TABLET BY MOUTH TWICE DAILY    . QUEtiapine (SEROQUEL) 25 MG tablet Take 1 tablet (25 mg total) by mouth at bedtime. 30 tablet 1  . sertraline (ZOLOFT) 100 MG tablet Take 1 tablet (100 mg total) by mouth daily. 30 tablet 0  . simethicone (MYLICON) 0000000 MG chewable tablet Chew 125 mg by mouth every 6 (  six) hours as needed for flatulence (as needed.).     Current Facility-Administered Medications  Medication Dose Route Frequency Provider Last Rate Last Admin  . 0.9 %  sodium chloride infusion  500 mL Intravenous Once Armbruster, Carlota Raspberry, MD           Psychiatric Specialty Exam: Review of Systems  Cardiovascular: Negative for chest pain.    There were no vitals taken for this visit.There is no height or weight on file to calculate BMI.  General Appearance: Casual  Eye Contact:  Fair  Speech:  Slow  Volume:  Decreased  Mood:  constricted  Affect:  Congruent  Thought Process: linear  Orientation:  Full (Time, Place, and Person)  Thought Content:  Rumination  Suicidal Thoughts:  No  Homicidal Thoughts:  No  Memory:  Immediate;   Fair Recent;   Fair  Judgement:  Fair  Insight:  Shallow  Psychomotor Activity:  Decreased  Concentration:  Concentration: Fair and Attention Span: Fair  Recall:  [employer] of Knowledge:Fair  Language: Fair  Akathisia:  No  Handed:  AIMS (if indicated):    Assets:  Desire for Improvement Social Support  ADL's:  Intact  Cognition: present  Sleep:  Fair   Screenings:   Assessment and Plan: as follows  Mood disorder unspecified: gets edgy with family, not suicidal. Stop lamictal some concern of itching, will  start smallest dose of seroquel. Discussed tremors or side effects if any to stop. Otherwise increase to 50mg  in few days at night GAD: fluctuates, continue zoloft has family support Discuss to arrange counselling for anger and mood symptoms   Patient has difficulty with change, discussed with sister to have small rules he gets used to it and adapt and learn with care giving and activities.   I discussed the assessment and treatment plan with the patient. The patient was provided an opportunity to ask questions and all were answered. The patient agreed with the plan and demonstrated an understanding of the instructions.   The patient was advised to call back or seek an in-person evaluation if the symptoms worsen or if the condition fails to improve as anticipated. Fu 3-4 weeks I provided 20 minutes of non-face-to-face time during this encounter. Merian Capron, MD 5/27/20218:53 AM

## 2019-07-06 DIAGNOSIS — E782 Mixed hyperlipidemia: Secondary | ICD-10-CM | POA: Insufficient documentation

## 2019-07-06 DIAGNOSIS — J310 Chronic rhinitis: Secondary | ICD-10-CM | POA: Insufficient documentation

## 2019-07-08 ENCOUNTER — Emergency Department (HOSPITAL_COMMUNITY)
Admission: EM | Admit: 2019-07-08 | Discharge: 2019-07-09 | Disposition: A | Discharge status: Home / Self Care | Payer: Medicare Other | Attending: Emergency Medicine | Admitting: Emergency Medicine

## 2019-07-08 ENCOUNTER — Other Ambulatory Visit: Payer: Self-pay

## 2019-07-08 ENCOUNTER — Emergency Department (HOSPITAL_COMMUNITY): Payer: Medicare Other

## 2019-07-08 ENCOUNTER — Encounter (HOSPITAL_COMMUNITY): Payer: Self-pay

## 2019-07-08 DIAGNOSIS — I10 Essential (primary) hypertension: Secondary | ICD-10-CM | POA: Insufficient documentation

## 2019-07-08 DIAGNOSIS — R0789 Other chest pain: Secondary | ICD-10-CM | POA: Insufficient documentation

## 2019-07-08 DIAGNOSIS — F840 Autistic disorder: Secondary | ICD-10-CM | POA: Diagnosis not present

## 2019-07-08 DIAGNOSIS — Z79899 Other long term (current) drug therapy: Secondary | ICD-10-CM | POA: Insufficient documentation

## 2019-07-08 DIAGNOSIS — R079 Chest pain, unspecified: Secondary | ICD-10-CM

## 2019-07-08 DIAGNOSIS — R519 Headache, unspecified: Secondary | ICD-10-CM | POA: Diagnosis not present

## 2019-07-08 LAB — BASIC METABOLIC PANEL
Anion gap: 10 (ref 5–15)
BUN: 25 mg/dL — ABNORMAL HIGH (ref 6–20)
CO2: 27 mmol/L (ref 22–32)
Calcium: 10 mg/dL (ref 8.9–10.3)
Chloride: 101 mmol/L (ref 98–111)
Creatinine, Ser: 0.89 mg/dL (ref 0.61–1.24)
GFR calc Af Amer: >60 mL/min (ref >60)
GFR calc non Af Amer: >60 mL/min (ref >60)
Glucose, Bld: 108 mg/dL — ABNORMAL HIGH (ref 70–99)
Potassium: 3.6 mmol/L (ref 3.5–5.1)
Sodium: 138 mmol/L (ref 135–145)

## 2019-07-08 LAB — CBC
HCT: 44.6 % (ref 39.0–52.0)
Hemoglobin: 14.5 g/dL (ref 13.0–17.0)
MCH: 28.4 pg (ref 26.0–34.0)
MCHC: 32.5 g/dL (ref 30.0–36.0)
MCV: 87.5 fL (ref 80.0–100.0)
Platelets: 328 10*3/uL (ref 150–400)
RBC: 5.1 MIL/uL (ref 4.22–5.81)
RDW: 12.6 % (ref 11.5–15.5)
WBC: 7.5 10*3/uL (ref 4.0–10.5)
nRBC: 0 % (ref 0.0–0.2)

## 2019-07-08 LAB — TROPONIN I (HIGH SENSITIVITY)
Troponin I (High Sensitivity): <2 ng/L (ref <18)
Troponin I (High Sensitivity): <2 ng/L (ref <18)

## 2019-07-08 MED ORDER — ACETAMINOPHEN 500 MG PO TABS
1000.0000 mg | ORAL_TABLET | Freq: Once | ORAL | Status: AC
  Administered 2019-07-09: 1000 mg via ORAL
  Filled 2019-07-08: qty 2

## 2019-07-08 NOTE — ED Triage Notes (Signed)
Pt arrives POV for eval of chest pain x 3 months and headache x 56month. Mother reports that he was taken off abilify about 1 month ago. Mother reports recently changed to a different med- unsure of name. Sister reports behavioral changes including anger, lashing out

## 2019-07-08 NOTE — ED Provider Notes (Signed)
[facility] DEPARTMENT Provider Note   CSN: [id number] Arrival date & time: 07/08/19  1543     History Chief Complaint  Patient presents with  . Headache  . Chest Pain    Tim Nelson is a 44 y.o. male.  The history is provided by medical records and the patient.  Headache Chest Pain Associated symptoms: headache      44 year old male with history of seasonal allergies, anxiety, autism, esophagitis, GERD, hyperlipidemia, hypertension, presenting to the ED with sister who is his primary caregiver for evaluation of chest pain and headaches for several months now.  As patient is autistic and difficulty with history, sister is giving majority of the details.  Patient was taken off of his Abilify about a month ago due to resting tremors of the lips, hands, and feet.  He had been on this medication for quite some time and never had issues.  He was switched to Lamictal but had issues with itching.  He was referred to psychiatry and was just changed to Seroquel about a week ago.  Sister reports due to his autism, he has a lot of difficulty accepting change and does not do well when a lot of things change at once.  States he has had difficulty adjusting and has expressed on multiple occasions that he feels like "my mind is not right".  He does have some aggressive outbursts at times, he has not inflicted harm on the family or himself.  He also has had a disrupted schedule as he did have a part-time job and chores to do at home, but that has altered recently due to his mother's declining health.  Sister reports he has been complaining of headaches and chest pain but they have not yet had him evaluated because they were not sure what to make of it.  He has not had any apparent confusion, focal numbness/weakness, dizziness, syncopal events.  Not currently on anticoagulation.  No falls or head trauma.  No cardiac history.  He is not a smoker.  Past Medical History:    Diagnosis Date  . Allergy   . Anxiety and depression   . Autism   . Dysthymic disorder   . Esophageal stricture   . Esophagitis, unspecified   . GERD (gastroesophageal reflux disease)   . History of chronic cough    dry cough-sleeps with elevated head of bed.  [name] Kitchen Hx of colonic polyps   . Hyperlipemia   . Hypertension   . Seasonal allergies     Patient Active Problem List   Diagnosis Date Noted  . Cough 02/11/2013  . Stricture and stenosis of esophagus 12/11/2010  . Family history of malignant neoplasm of gastrointestinal tract 10/23/2010  . ANXIETY DEPRESSION 04/25/2008  . HEMORRHOIDS 04/25/2008  . GERD 04/25/2008  . POLYP, COLON 05/13/2007    Past Surgical History:  Procedure Laterality Date  . BRAVO [place] STUDY N/A 03/28/2013   Procedure: BRAVO [place] STUDY;  Surgeon: Inda Castle, MD;  Location: [facility];  Service: Endoscopy;  Laterality: N/A;  . COLONSCOPY  2014   hx. polyps with past testing  . DENTAL SURGERY    . ESOPHAGEAL MANOMETRY N/A 04/25/2013   Procedure: ESOPHAGEAL MANOMETRY (EM);  Surgeon: Inda Castle, MD;  Location: [facility];  Service: Endoscopy;  Laterality: N/A;  . ESOPHAGOGASTRODUODENOSCOPY N/A 03/28/2013   Procedure: ESOPHAGOGASTRODUODENOSCOPY (EGD);  Surgeon: Inda Castle, MD;  Location: [facility];  Service: Endoscopy;  Laterality: N/A;  . HERNIA REPAIR  AS INFANT   groin       Family History  Problem Relation Age of Onset  . Colon cancer Brother 71  . Irritable bowel syndrome Mother   . Colon polyps Mother   . Diabetes Mother   . Kidney cancer Mother   . Colon cancer Maternal Grandfather 47  . Esophageal cancer Maternal Grandfather   . Liver cancer Brother     Social History   Tobacco Use  . Smoking status: Never Smoker  . Smokeless tobacco: Never Used  Substance Use Topics  . Alcohol use: No  . Drug use: No    Home Medications Prior to Admission medications   Medication Sig Start Date End Date Taking? Authorizing  Provider  acetaminophen (TYLENOL) 500 MG tablet Take 1,000 mg by mouth every 6 (six) hours as needed for mild pain or moderate pain.     [provider]  aluminum hydroxide-magnesium carbonate (GAVISCON) 95-358 MG/15ML SUSP Take 15 mLs by mouth as needed.    [provider]  atorvastatin (LIPITOR) 10 MG tablet Take 10 mg by mouth every evening.     [provider]  Ca Carbonate-Mag Hydroxide (ROLAIDS) 550-110 MG CHEW Chew 1 tablet by mouth. Reported on 04/19/2015    [provider]  famotidine (PEPCID) 20 MG tablet Take 1 tablet (20 mg total) by mouth daily. 05/31/19 06/30/19  Noralyn Pick, NP  fenofibrate micronized (LOFIBRA) 134 MG capsule Take 134 mg by mouth daily before breakfast.    [provider]  fexofenadine (ALLEGRA) 180 MG tablet Take 180 mg by mouth daily.    [provider]  hydrochlorothiazide (HYDRODIURIL) 25 MG tablet Take 25 mg by mouth 2 (two) times daily.    [provider]  lisinopril (ZESTRIL) 5 MG tablet Take 5 mg by mouth daily.    [provider]  omeprazole (PRILOSEC) 40 MG capsule Take 1 capsule (40 mg total) by mouth in the morning and at bedtime. 05/26/19   Armbruster, Carlota Raspberry, MD  pantoprazole (PROTONIX) 40 MG tablet TAKE ONE TABLET BY MOUTH TWICE DAILY 07/07/17   [provider]  QUEtiapine (SEROQUEL) 25 MG tablet Take 1 tablet (25 mg total) by mouth at bedtime. 06/30/19   Merian Capron, MD  sertraline (ZOLOFT) 100 MG tablet Take 1 tablet (100 mg total) by mouth daily. 06/30/19 06/29/20  Merian Capron, MD  simethicone (MYLICON) 196 MG chewable tablet Chew 125 mg by mouth every 6 (six) hours as needed for flatulence (as needed.).    [provider]  lamoTRIgine (LAMICTAL) 25 MG tablet Take 1 tablet (25 mg total) by mouth daily. Take one tablet daily for a week and then start taking 2 tablets. 06/02/19 06/30/19  Merian Capron, MD    Allergies    Cefaclor, Dimetapp dm  cold-cough [phenylephrine-bromphen-dm], and Furosemide  Review of Systems   Review of Systems  Cardiovascular: Positive for chest pain.  Neurological: Positive for headaches.  All other systems reviewed and are negative.   Physical Exam Updated Vital Signs BP 110/76   Pulse 60   Temp 99.2 F (37.3 C) (Oral)   Resp 16   Ht 5\' 7"  (1.702 m)   Wt 88.5 kg   SpO2 99%   BMI 30.54 kg/m   Physical Exam Vitals and nursing note reviewed.  Constitutional:      General: He is not in acute distress.    Appearance: He is well-developed. He is not diaphoretic.  HENT:     Head: Normocephalic  and atraumatic.     Right Ear: External ear normal.     Left Ear: External ear normal.  Eyes:     Conjunctiva/sclera: Conjunctivae normal.     Pupils: Pupils are equal, round, and reactive to light.  Neck:     Comments: No rigidity, no meningismus Cardiovascular:     Rate and Rhythm: Normal rate and regular rhythm.     Heart sounds: Normal heart sounds. No murmur.  Pulmonary:     Effort: Pulmonary effort is normal. No respiratory distress.     Breath sounds: Normal breath sounds. No wheezing or rhonchi.  Abdominal:     General: Bowel sounds are normal.     Palpations: Abdomen is soft.     Tenderness: There is no abdominal tenderness. There is no guarding.  Musculoskeletal:        General: Normal range of motion.     Cervical back: Full passive range of motion without pain, normal range of motion and neck supple. No rigidity.  Skin:    General: Skin is warm and dry.     Findings: No rash.  Neurological:     Mental Status: He is alert and oriented to person, place, and time.     Cranial Nerves: No cranial nerve deficit.     Sensory: No sensory deficit.     Motor: No tremor or seizure activity.     Comments: AAOx3, answering questions and following commands appropriately; equal strength UE and LE bilaterally; CN grossly intact; moves all extremities appropriately without ataxia; no focal  neuro deficits or facial asymmetry appreciated  Psychiatric:        Behavior: Behavior normal.        Thought Content: Thought content normal.     Comments: Calm, cooperative     ED Results / Procedures / Treatments   Labs (all labs ordered are listed, but only abnormal results are displayed) Labs Reviewed  BASIC METABOLIC PANEL - Abnormal; Notable for the following components:      Result Value   Glucose, Bld 108 (*)    BUN 25 (*)    All other components within normal limits  CBC  TROPONIN I (HIGH SENSITIVITY)  TROPONIN I (HIGH SENSITIVITY)    EKG None  Radiology DG Chest 2 View  Result Date: 07/08/2019 CLINICAL DATA:  Chest pain EXAM: CHEST - 2 VIEW COMPARISON:  04/19/2015 FINDINGS: Heart and mediastinal contours are within normal limits. No focal opacities or effusions. No acute bony abnormality. IMPRESSION: No active cardiopulmonary disease. Electronically Signed   By: Rolm Baptise M.D.   On: 07/08/2019 16:54    Procedures Procedures (including critical care time)  Medications Ordered in ED Medications - No data to display  ED Course  I have reviewed the triage vital signs and the nursing notes.  Pertinent labs & imaging results that were available during my care of the patient were reviewed by me and considered in my medical decision making (see chart for details).    MDM Rules/Calculators/A&P  44 year old male presenting to the ED with intermittent chest pain and headache for the past few months.  Majority of history provided by sister as patient is autistic.  After talking with him he does seem a lot of his issues may be psychiatric in nature.  He does have difficulty with adjustment and has had a lot of changes to his daily routine lately as well as several medication adjustments.  He is not complaining of any current headache or chest pain,  does state his back hurts from sitting in the waiting room for so long.  EKG is nonischemic.  Labs are reassuring including  troponin x2.  At this time, low suspicion for ACS, PE, dissection, acute cardiac event.  Headache is without any focal neurologic deficits and I have low suspicion for acute intracranial process.  No meningeal signs.  Patient does express some frustration.  It appears patient was supposed to be set up with counseling that they thought would help with his adjustment issues, this is yet to happen.  He was also due to start physical therapy recently to help with his mobility at that has not happened either.  At this point, he does have some ongoing psychiatric issues, however does not appear to need inpatient hospitalization.  He does not appear to be a danger to himself or others at this time and has a good support system at home.  Will give outpatient resources for local counseling centers that sister may follow-up with.  I have also messaged his psychiatrist as sisters request to follow-up on this.  May return here for any new/acute changes.  Final Clinical Impression(s) / ED Diagnoses Final diagnoses:  Chest pain, unspecified type    Rx / DC Orders ED Discharge Orders    None       Larene Pickett, PA-C 07/09/19 0359    Merrily Pew, MD 07/09/19 [phone]

## 2019-07-09 NOTE — Discharge Instructions (Addendum)
Stay on current medication regimen. Resource guide attached to help find a counseling/therapy center.   Follow-up with your primary care doctor and psychiatrist if any acute changes. Return here for any new/acute changes.

## 2019-07-14 ENCOUNTER — Encounter (HOSPITAL_COMMUNITY): Payer: Self-pay | Admitting: Psychiatry

## 2019-07-14 ENCOUNTER — Telehealth (INDEPENDENT_AMBULATORY_CARE_PROVIDER_SITE_OTHER): Payer: Medicare Other | Admitting: Psychiatry

## 2019-07-14 DIAGNOSIS — F840 Autistic disorder: Secondary | ICD-10-CM

## 2019-07-14 DIAGNOSIS — F0630 Mood disorder due to known physiological condition, unspecified: Secondary | ICD-10-CM | POA: Diagnosis not present

## 2019-07-14 DIAGNOSIS — F411 Generalized anxiety disorder: Secondary | ICD-10-CM

## 2019-07-14 MED ORDER — QUETIAPINE FUMARATE 50 MG PO TABS: 50.0000 mg | ORAL_TABLET | Freq: Every day | ORAL | 0 refills | Status: DC

## 2019-07-14 NOTE — Progress Notes (Signed)
[facility] Follow up visit   Patient Identification: Tim Nelson MRN:  [id number] Date of Evaluation:  07/14/2019 Referral Source: primary care Chief Complaint:   follow up mood symptoms Visit Diagnosis:    ICD-10-CM   1. Mood disorder in conditions classified elsewhere  F06.30   2. GAD (generalized anxiety disorder)  F41.1   3. Autism  F84.0     I connected with Tim Nelson on 07/14/19 at 11:00 AM EDT by telephone and verified that I am speaking with the correct person using two identifiers.  Patient location : home with sister Provider location : home office   I discussed the limitations of evaluation and management by telemedicine and the availability of in person appointments. The patient expressed understanding and agreed to proceed.  History of Present Illness:  44 years old white male single lives with sister, mom brother in law Diagnosed with developmental delay, autism, speech problems initially referred for mood symtoms  Have seen Dr. Theda Sers and staff [facility] managed with abilify for more then 20 years for mood symptoms and zoloft for depression anxiety. After Dr. Theda Sers passed away and staff retired took med from PCP  Early visit due to family concern he gets edgy, poor sleep, has been talking to people that family may hurt him or paranoid  He did better on abilify but had tremors so had to be stopped  seroquel was started at present is low dose, family has scheduled therapy as well  Does worry and gets anxious excessive when have to do a procedure or go out in unfamiliar settings.  Avoids public, was not able to continue work with having mood symptoms   Aggravating factor: change, public, decreased executive functioning Modifying factor: sister, mom  Duration since childhood      Past Psychiatric History: autism, anxiety    Past Medical History:  Past Medical History:  Diagnosis Date  . Allergy   . Anxiety and depression   . Autism   .  Dysthymic disorder   . Esophageal stricture   . Esophagitis, unspecified   . GERD (gastroesophageal reflux disease)   . History of chronic cough    dry cough-sleeps with elevated head of bed.  [name] Kitchen Hx of colonic polyps   . Hyperlipemia   . Hypertension   . Seasonal allergies     Past Surgical History:  Procedure Laterality Date  . BRAVO [place] STUDY N/A 03/28/2013   Procedure: BRAVO [place] STUDY;  Surgeon: Inda Castle, MD;  Location: [facility];  Service: Endoscopy;  Laterality: N/A;  . COLONSCOPY  2014   hx. polyps with past testing  . DENTAL SURGERY    . ESOPHAGEAL MANOMETRY N/A 04/25/2013   Procedure: ESOPHAGEAL MANOMETRY (EM);  Surgeon: Inda Castle, MD;  Location: [facility];  Service: Endoscopy;  Laterality: N/A;  . ESOPHAGOGASTRODUODENOSCOPY N/A 03/28/2013   Procedure: ESOPHAGOGASTRODUODENOSCOPY (EGD);  Surgeon: Inda Castle, MD;  Location: [facility];  Service: Endoscopy;  Laterality: N/A;  . HERNIA REPAIR  AS INFANT   groin    Family Psychiatric History: denies  Family History:  Family History  Problem Relation Age of Onset  . Colon cancer Brother 50  . Irritable bowel syndrome Mother   . Colon polyps Mother   . Diabetes Mother   . Kidney cancer Mother   . Colon cancer Maternal Grandfather 85  . Esophageal cancer Maternal Grandfather   . Liver cancer Brother     Social History:   Social History  Socioeconomic History  . Marital status: Single    Spouse name: Not on file  . Number of children: Not on file  . Years of education: Not on file  . Highest education level: Not on file  Occupational History  . Occupation: Unemployed   Tobacco Use  . Smoking status: Never Smoker  . Smokeless tobacco: Never Used  Vaping Use  . Vaping Use: Never used  Substance and Sexual Activity  . Alcohol use: No  . Drug use: No  . Sexual activity: Not on file  Other Topics Concern  . Not on file  Social History Narrative  . Not on file   Social Determinants of  Health   Financial Resource Strain:   . Difficulty of Paying Living Expenses:   Food Insecurity:   . Worried About Charity fundraiser in the Last Year:   . Arboriculturist in the Last Year:   Transportation Needs:   . Film/video editor (Medical):   [name] Kitchen Lack of Transportation (Non-Medical):   Physical Activity:   . Days of Exercise per Week:   . Minutes of Exercise per Session:   Stress:   . Feeling of Stress :   Social Connections:   . Frequency of Communication with Friends and Family:   . Frequency of Social Gatherings with Friends and Family:   . Attends Religious Services:   . Active Member of Clubs or Organizations:   . Attends Archivist Meetings:   [name] Kitchen Marital Status:       Allergies:   Allergies  Allergen Reactions  . Cefaclor Shortness Of Breath and Swelling  . Dimetapp Dm Cold-Cough [Phenylephrine-Bromphen-Dm] Shortness Of Breath and Swelling  . Furosemide Rash    Metabolic Disorder Labs: No results found for: HGBA1C, MPG No results found for: PROLACTIN No results found for: CHOL, TRIG, HDL, CHOLHDL, VLDL, LDLCALC No results found for: TSH  Therapeutic Level Labs: No results found for: LITHIUM No results found for: CBMZ No results found for: VALPROATE  Current Medications: Current Outpatient Medications  Medication Sig Dispense Refill  . acetaminophen (TYLENOL) 500 MG tablet Take 1,000 mg by mouth every 6 (six) hours as needed for mild pain or moderate pain.     [name] Kitchen aluminum hydroxide-magnesium carbonate (GAVISCON) 95-358 MG/15ML SUSP Take 15 mLs by mouth as needed.    [name] Kitchen atorvastatin (LIPITOR) 10 MG tablet Take 10 mg by mouth every evening.     . Ca Carbonate-Mag Hydroxide (ROLAIDS) 550-110 MG CHEW Chew 1 tablet by mouth. Reported on 04/19/2015    . famotidine (PEPCID) 20 MG tablet Take 1 tablet (20 mg total) by mouth daily. 30 tablet 1  . fenofibrate micronized (LOFIBRA) 134 MG capsule Take 134 mg by mouth daily before breakfast.    .  fexofenadine (ALLEGRA) 180 MG tablet Take 180 mg by mouth daily.    . hydrochlorothiazide (HYDRODIURIL) 25 MG tablet Take 25 mg by mouth 2 (two) times daily.    [name] Kitchen lisinopril (ZESTRIL) 5 MG tablet Take 5 mg by mouth daily.    [name] Kitchen omeprazole (PRILOSEC) 40 MG capsule Take 1 capsule (40 mg total) by mouth in the morning and at bedtime. 180 capsule 1  . pantoprazole (PROTONIX) 40 MG tablet TAKE ONE TABLET BY MOUTH TWICE DAILY    . QUEtiapine (SEROQUEL) 50 MG tablet Take 1 tablet (50 mg total) by mouth at bedtime. 30 tablet 0  . sertraline (ZOLOFT) 100 MG tablet Take 1 tablet (100 mg total) by mouth  daily. 30 tablet 0  . simethicone (MYLICON) 916 MG chewable tablet Chew 125 mg by mouth every 6 (six) hours as needed for flatulence (as needed.).     Current Facility-Administered Medications  Medication Dose Route Frequency Provider Last Rate Last Admin  . 0.9 %  sodium chloride infusion  500 mL Intravenous Once Armbruster, Carlota Raspberry, MD           Psychiatric Specialty Exam: Review of Systems  Cardiovascular: Negative for chest pain.  Psychiatric/Behavioral: Positive for agitation.    There were no vitals taken for this visit.There is no height or weight on file to calculate BMI.  General Appearance:  Eye Contact:    Speech:  Slow  Volume:  Decreased  Mood:  Somewhat subdued  Affect:  Congruent  Thought Process: linear  Orientation:  Full (Time, Place, and Person)  Thought Content:  Rumination  Suicidal Thoughts:  No  Homicidal Thoughts:  No  Memory:  Immediate;   Fair Recent;   Fair  Judgement:  Fair  Insight:  Shallow  Psychomotor Activity:  Decreased  Concentration:  Concentration: Fair and Attention Span: Fair  Recall:  [employer] of Knowledge:Fair  Language: Fair  Akathisia:  No  Handed:  AIMS (if indicated):    Assets:  Desire for Improvement Social Support  ADL's:  Intact  Cognition: present  Sleep:  Fair   Screenings:   Assessment and Plan: as follows  Mood  disorder unspecified:gets edgy with family , increase seroquel to 50mg  , has poor sleep, can increase to 100mg  in 2 weeks if needed and tolerates it. Sister agrees to plan Schedule therpay to work on mood symptoms and how to communicate with family GAD: fluctuates, continue zoloft and therapy  Patient has difficulty with change, discussed with sister to have small rules he gets used to it and adapt and learn with care giving and activities.   I discussed the assessment and treatment plan with the patient. The patient was provided an opportunity to ask questions and all were answered. The patient agreed with the plan and demonstrated an understanding of the instructions.   The patient was advised to call back or seek an in-person evaluation if the symptoms worsen or if the condition fails to improve as anticipated. Fu 3-4 weeks I provided 20 minutes of non-face-to-face time during this encounter. Merian Capron, MD 6/10/202111:12 AM

## 2019-07-25 ENCOUNTER — Ambulatory Visit (INDEPENDENT_AMBULATORY_CARE_PROVIDER_SITE_OTHER): Payer: Medicare Other | Admitting: Licensed Clinical Social Worker

## 2019-07-25 DIAGNOSIS — F411 Generalized anxiety disorder: Secondary | ICD-10-CM | POA: Diagnosis not present

## 2019-07-25 DIAGNOSIS — F0630 Mood disorder due to known physiological condition, unspecified: Secondary | ICD-10-CM

## 2019-07-25 DIAGNOSIS — F840 Autistic disorder: Secondary | ICD-10-CM | POA: Diagnosis not present

## 2019-07-26 NOTE — Progress Notes (Signed)
Virtual Visit via Video Note  I connected with Tim Nelson on 07/26/19 at  6:00 PM EDT by a video enabled telemedicine application and verified that I am speaking with the correct person using two identifiers.  Location: Patient: Home Provider: Office   I discussed the limitations of evaluation and management by telemedicine and the availability of in person appointments. The patient expressed understanding and agreed to proceed.  Comprehensive Clinical Assessment (CCA) Note  07/26/2019 Tim Nelson [id number]  Visit Diagnosis:      ICD-10-CM   1. Mood disorder in conditions classified elsewhere  F06.30   2. GAD (generalized anxiety disorder)  F41.1   3. Autism  F84.0      CCA Biopsychosocial  Intake/Chief Complaint:  CCA Intake With Chief Complaint CCA Part Two Date: 07/25/19 CCA Part Two Time: 1817 Chief Complaint/Presenting Problem: Mood, Anxiety Patient's Currently Reported Symptoms/Problems: Mood: agitated, sad,  some difficulty with concentration,  appetite flucuates (eats healthy sometimes, other times doesn't), difficulty staying sleep, tearful,  some feelings of hopeless,  feelings of worthlessness,    Anxiety: worries about losing family, friends, possible worrying about germs,    strained relationship with father Individual's Strengths: Playing video games, reading his BIble, learning to be independent, big heart Individual's Preferences: Prefers to be with family, prefers to play video games, doesn't prefer to be told what to do Individual's Abilities: Can acomplish what he puts his mind to Type of Services Patient Feels Are Needed: Therapy, medication management Initial Clinical Notes/Concerns: Symptoms started in childhood around age 24 or 38 but increased over the last several months, symptoms occur daily, symptoms are moderate  Mental Health Symptoms Depression:  Depression: Irritability, Difficulty Concentrating, Increase/decrease in appetite, Sleep (too much or  little), Tearfulness, Hopelessness  Mania:  Mania: None  Anxiety:   Anxiety: Difficulty concentrating, Restlessness, Tension, Worrying, Irritability  Psychosis:  Psychosis: None  Trauma:  Trauma: None  Obsessions:  Obsessions: None  Compulsions:  Compulsions: None  Inattention:  Inattention: None  Hyperactivity/Impulsivity:  Hyperactivity/Impulsivity: N/A  Oppositional/Defiant Behaviors:  Oppositional/Defiant Behaviors: None  Emotional Irregularity:  Emotional Irregularity: None  Other Mood/Personality Symptoms:  Other Mood/Personality Symptoms: None   Mental Status Exam Appearance and self-care  Stature:  Stature: Average  Weight:  Weight: Average weight  Clothing:  Clothing: Casual  Grooming:  Grooming: Well-groomed  Cosmetic use:  Cosmetic Use: None  Posture/gait:  Posture/Gait: Normal  Motor activity:  Motor Activity: Not Remarkable  Sensorium  Attention:  Attention: Normal  Concentration:  Concentration: Normal  Orientation:  Orientation: X5  Recall/memory:  Recall/Memory: Normal  Affect and Mood  Affect:  Affect: Appropriate  Mood:  Mood: Euthymic  Relating  Eye contact:  Eye Contact: Normal  Facial expression:  Facial Expression: Responsive  Attitude toward examiner:  Attitude Toward Examiner: Cooperative  Thought and Language  Speech flow: Speech Flow: Normal  Thought content:  Thought Content: Appropriate to Mood and Circumstances  Preoccupation:  Preoccupations: None  Hallucinations:  Hallucinations: None  Organization:   Landscape architect of Knowledge:  Fund of Knowledge: Average  Intelligence:  Intelligence: Below average  Abstraction:  Abstraction: Normal  Judgement:  Judgement: Good  Reality Testing:  Reality Testing: Adequate  Insight:  Insight: Fair  Decision Making:  Decision Making: Normal  Social Functioning  Social Maturity:  Social Maturity: Responsible  Social Judgement:  Social Judgement: Normal  Stress  Stressors:   Stressors: Family conflict, Transitions  Coping Ability:  Coping Ability: Normal  Skill Deficits:  Skill Deficits: Activities of daily living, Interpersonal  Supports:  Supports: Family     Religion: Religion/Spirituality Are You A Religious Person?: Yes What is Your Religious Affiliation?: Baptist How Might This Affect Treatment?: Support in treatment  Leisure/Recreation: Leisure / Recreation Do You Have Hobbies?: Yes Leisure and Hobbies: Play videog games  Exercise/Diet: Exercise/Diet Do You Exercise?: Yes What Type of Exercise Do You Do?: Run/Walk, Weight Training How Many Times a Week Do You Exercise?: Daily Have You Gained or Lost A Significant Amount of Weight in the Past Six Months?: Yes-Lost Number of Pounds Lost?: 55 Do You Follow a Special Diet?: No Do You Have Any Trouble Sleeping?: No   CCA Employment/Education  Employment/Work Situation: Employment / Work Situation Employment situation: Unemployed Patient's job has been impacted by current illness: No What is the longest time patient has a held a job?: 573 Where was the patient employed at that time?: 5 years Has patient ever been in the [employer]?: No  Education: Education Is Patient Currently Attending School?: No Last Grade Completed: 12 Name of High School: [employer] Did Teacher, adult education From [employer]?: Yes Did Physicist, medical?: No Did Heritage manager?: No Did You Have Any Special Interests In School?: Earth Science Did You Have An Individualized Education Program (IIEP): Yes (Got extra help in school) Did You Have Any Difficulty At School?: No Patient's Education Has Been Impacted by Current Illness: No   CCA Family/Childhood History  Family and Relationship History: Family history Marital status: Single Are you sexually active?: No What is your sexual orientation?: Heterosexual Has your sexual activity been affected by drugs, alcohol, medication, or emotional  stress?: N/A Does patient have children?: No  Childhood History:  Childhood History By whom was/is the patient raised?: Mother Additional childhood history information: Mother raised him. Father has not been present in his life. Patient describes his childhood as "typical." Description of patient's relationship with caregiver when they were a child: Mother: good,    Father: strained Patient's description of current relationship with people who raised him/her: Mother: good, Father: strained How were you disciplined when you got in trouble as a child/adolescent?: Spanking, grounded, things taken away Does patient have siblings?: Yes Number of Siblings: 2 Description of patient's current relationship with siblings: Sister, Brother: Good relationship, brother is deceased in 07/19/99 Did patient suffer any verbal/emotional/physical/sexual abuse as a child?: No Did patient suffer from severe childhood neglect?: No Has patient ever been sexually abused/assaulted/raped as an adolescent or adult?: No Was the patient ever a victim of a crime or a disaster?: No Witnessed domestic violence?: No Has patient been affected by domestic violence as an adult?: No  Child/Adolescent Assessment:     CCA Substance Use  Alcohol/Drug Use: Alcohol / Drug Use Pain Medications: See patient MAR Prescriptions: See patient MAR Over the Counter: See patient MAR History of alcohol / drug use?: No history of alcohol / drug abuse                         ASAM's:  Six Dimensions of Multidimensional Assessment  Dimension 1:  Acute Intoxication and/or Withdrawal Potential:   Dimension 1:  Description of individual's past and current experiences of substance use and withdrawal: None  Dimension 2:  Biomedical Conditions and Complications:   Dimension 2:  Description of patient's biomedical conditions and  complications: nOne  Dimension 3:  Emotional, Behavioral, or Cognitive Conditions and Complications:  Dimension 3:  Description of emotional, behavioral, or cognitive conditions and complications: None  Dimension 4:  Readiness to Change:  Dimension 4:  Description of Readiness to Change criteria: nOne  Dimension 5:  Relapse, Continued use, or Continued Problem Potential:  Dimension 5:  Relapse, continued use, or continued problem potential critiera description: NOne  Dimension 6:  Recovery/Living Environment:  Dimension 6:  Recovery/Iiving environment criteria description: None  ASAM Severity Score: ASAM's Severity Rating Score: 0  ASAM Recommended Level of Treatment:     Substance use Disorder (SUD)    Recommendations for Services/Supports/Treatments: Recommendations for Services/Supports/Treatments Recommendations For Services/Supports/Treatments: Individual Therapy, Medication Management  DSM5 Diagnoses: Patient Active Problem List   Diagnosis Date Noted  . Cough 02/11/2013  . Stricture and stenosis of esophagus 12/11/2010  . Family history of malignant neoplasm of gastrointestinal tract 10/23/2010  . ANXIETY DEPRESSION 04/25/2008  . HEMORRHOIDS 04/25/2008  . GERD 04/25/2008  . POLYP, COLON 05/13/2007    Patient Centered Plan: Patient is on the following Treatment Plan(s):  Depression   Referrals to Alternative Service(s): Referred to Alternative Service(s):   Place:   Date:   Time:    Referred to Alternative Service(s):   Place:   Date:   Time:    Referred to Alternative Service(s):   Place:   Date:   Time:    Referred to Alternative Service(s):   Place:   Date:   Time:     I discussed the assessment and treatment plan with the patient. The patient was provided an opportunity to ask questions and all were answered. The patient agreed with the plan and demonstrated an understanding of the instructions.   The patient was advised to call back or seek an in-person evaluation if the symptoms worsen or if the condition fails to improve as anticipated.  I provided 60 minutes of  non-face-to-face time during this encounter.    Glori Bickers, LCSW

## 2019-07-28 ENCOUNTER — Telehealth (HOSPITAL_COMMUNITY): Payer: Medicare Other | Admitting: Psychiatry

## 2019-08-11 ENCOUNTER — Telehealth (INDEPENDENT_AMBULATORY_CARE_PROVIDER_SITE_OTHER): Payer: Medicare Other | Admitting: Psychiatry

## 2019-08-11 ENCOUNTER — Encounter (HOSPITAL_COMMUNITY): Payer: Self-pay | Admitting: Psychiatry

## 2019-08-11 DIAGNOSIS — F411 Generalized anxiety disorder: Secondary | ICD-10-CM

## 2019-08-11 DIAGNOSIS — F840 Autistic disorder: Secondary | ICD-10-CM

## 2019-08-11 DIAGNOSIS — F0630 Mood disorder due to known physiological condition, unspecified: Secondary | ICD-10-CM | POA: Diagnosis not present

## 2019-08-11 MED ORDER — QUETIAPINE FUMARATE 100 MG PO TABS: 100.0000 mg | ORAL_TABLET | Freq: Every day | ORAL | 0 refills | Status: DC

## 2019-08-11 MED ORDER — LAMOTRIGINE 25 MG PO TABS: 25.0000 mg | ORAL_TABLET | Freq: Every day | ORAL | 0 refills | Status: DC

## 2019-08-11 NOTE — Progress Notes (Signed)
[facility] Follow up visit   Patient Identification: Tim Nelson MRN:  [id number] Date of Evaluation:  08/11/2019 Referral Source: primary care Chief Complaint:   Mood symptoms Visit Diagnosis:    ICD-10-CM   1. Mood disorder in conditions classified elsewhere  F06.30   2. GAD (generalized anxiety disorder)  F41.1   3. Autism  F84.0    I connected with Tim Nelson on 08/11/19 at  9:30 AM EDT by telephone and verified that I am speaking with the correct person using two identifiers.   I discussed the limitations, risks, security and privacy concerns of performing an evaluation and management service by telephone and the availability of in person appointments. I also discussed with the patient that there may be a patient responsible charge related to this service. The patient expressed understanding and agreed to proceed.  Patient location : home with sister Provider location : home office    History of Present Illness:  44 years old white male single lives with sister, mom brother in law Diagnosed with developmental delay, autism, speech problems initially referred for mood symtoms  Have seen Dr. Theda Sers and staff [facility] managed with abilify for more then 20 years for mood symptoms and zoloft for depression anxiety. After Dr. Theda Sers passed away and staff retired took med from PCP  Last visit seroquel started, helping sleep but still feels edgy, sister says he starts arguments and have mood swings During appointment appeared calm   Does not like unfamiliar situations Less going out that has effected him as well Sister is supportive Mom is sick as well. Avoids public, was not able to continue work with having mood symptoms     Aggravating factor: change, public Modifying factor: sister,mom  Duration since childhood      Past Psychiatric History: autism, anxiety    Past Medical History:  Past Medical History:  Diagnosis Date  . Allergy   . Anxiety and  depression   . Autism   . Dysthymic disorder   . Esophageal stricture   . Esophagitis, unspecified   . GERD (gastroesophageal reflux disease)   . History of chronic cough    dry cough-sleeps with elevated head of bed.  [name] Kitchen Hx of colonic polyps   . Hyperlipemia   . Hypertension   . Seasonal allergies     Past Surgical History:  Procedure Laterality Date  . BRAVO [place] STUDY N/A 03/28/2013   Procedure: BRAVO [place] STUDY;  Surgeon: Inda Castle, MD;  Location: [facility];  Service: Endoscopy;  Laterality: N/A;  . COLONSCOPY  2014   hx. polyps with past testing  . DENTAL SURGERY    . ESOPHAGEAL MANOMETRY N/A 04/25/2013   Procedure: ESOPHAGEAL MANOMETRY (EM);  Surgeon: Inda Castle, MD;  Location: [facility];  Service: Endoscopy;  Laterality: N/A;  . ESOPHAGOGASTRODUODENOSCOPY N/A 03/28/2013   Procedure: ESOPHAGOGASTRODUODENOSCOPY (EGD);  Surgeon: Inda Castle, MD;  Location: [facility];  Service: Endoscopy;  Laterality: N/A;  . HERNIA REPAIR  AS INFANT   groin    Family Psychiatric History: denies  Family History:  Family History  Problem Relation Age of Onset  . Colon cancer Brother 88  . Irritable bowel syndrome Mother   . Colon polyps Mother   . Diabetes Mother   . Kidney cancer Mother   . Colon cancer Maternal Grandfather 36  . Esophageal cancer Maternal Grandfather   . Liver cancer Brother     Social History:   Social History   Socioeconomic  History  . Marital status: Single    Spouse name: Not on file  . Number of children: Not on file  . Years of education: Not on file  . Highest education level: Not on file  Occupational History  . Occupation: Unemployed   Tobacco Use  . Smoking status: Never Smoker  . Smokeless tobacco: Never Used  Vaping Use  . Vaping Use: Never used  Substance and Sexual Activity  . Alcohol use: No  . Drug use: No  . Sexual activity: Not on file  Other Topics Concern  . Not on file  Social History Narrative  . Not on file    Social Determinants of Health   Financial Resource Strain:   . Difficulty of Paying Living Expenses:   Food Insecurity:   . Worried About Charity fundraiser in the Last Year:   . Arboriculturist in the Last Year:   Transportation Needs:   . Film/video editor (Medical):   [name] Kitchen Lack of Transportation (Non-Medical):   Physical Activity:   . Days of Exercise per Week:   . Minutes of Exercise per Session:   Stress:   . Feeling of Stress :   Social Connections:   . Frequency of Communication with Friends and Family:   . Frequency of Social Gatherings with Friends and Family:   . Attends Religious Services:   . Active Member of Clubs or Organizations:   . Attends Archivist Meetings:   [name] Kitchen Marital Status:       Allergies:   Allergies  Allergen Reactions  . Cefaclor Shortness Of Breath and Swelling  . Dimetapp Dm Cold-Cough [Phenylephrine-Bromphen-Dm] Shortness Of Breath and Swelling  . Furosemide Rash    Metabolic Disorder Labs: No results found for: HGBA1C, MPG No results found for: PROLACTIN No results found for: CHOL, TRIG, HDL, CHOLHDL, VLDL, LDLCALC No results found for: TSH  Therapeutic Level Labs: No results found for: LITHIUM No results found for: CBMZ No results found for: VALPROATE  Current Medications: Current Outpatient Medications  Medication Sig Dispense Refill  . acetaminophen (TYLENOL) 500 MG tablet Take 1,000 mg by mouth every 6 (six) hours as needed for mild pain or moderate pain.     [name] Kitchen aluminum hydroxide-magnesium carbonate (GAVISCON) 95-358 MG/15ML SUSP Take 15 mLs by mouth as needed.    [name] Kitchen atorvastatin (LIPITOR) 10 MG tablet Take 10 mg by mouth every evening.     . Ca Carbonate-Mag Hydroxide (ROLAIDS) 550-110 MG CHEW Chew 1 tablet by mouth. Reported on 04/19/2015    . famotidine (PEPCID) 20 MG tablet Take 1 tablet (20 mg total) by mouth daily. 30 tablet 1  . fenofibrate micronized (LOFIBRA) 134 MG capsule Take 134 mg by mouth daily  before breakfast.    . fexofenadine (ALLEGRA) 180 MG tablet Take 180 mg by mouth daily.    . hydrochlorothiazide (HYDRODIURIL) 25 MG tablet Take 25 mg by mouth 2 (two) times daily.    [name] Kitchen lamoTRIgine (LAMICTAL) 25 MG tablet Take 1 tablet (25 mg total) by mouth daily. Take one tablet daily for a week and then start taking 2 tablets. 60 tablet 0  . lisinopril (ZESTRIL) 5 MG tablet Take 5 mg by mouth daily.    [name] Kitchen omeprazole (PRILOSEC) 40 MG capsule Take 1 capsule (40 mg total) by mouth in the morning and at bedtime. 180 capsule 1  . pantoprazole (PROTONIX) 40 MG tablet TAKE ONE TABLET BY MOUTH TWICE DAILY    . QUEtiapine (SEROQUEL)  100 MG tablet Take 1 tablet (100 mg total) by mouth at bedtime. 30 tablet 0  . sertraline (ZOLOFT) 100 MG tablet Take 1 tablet (100 mg total) by mouth daily. 30 tablet 0  . simethicone (MYLICON) 778 MG chewable tablet Chew 125 mg by mouth every 6 (six) hours as needed for flatulence (as needed.).     Current Facility-Administered Medications  Medication Dose Route Frequency Provider Last Rate Last Admin  . 0.9 %  sodium chloride infusion  500 mL Intravenous Once Armbruster, Carlota Raspberry, MD           Psychiatric Specialty Exam: Review of Systems  Cardiovascular: Negative for chest pain.  Psychiatric/Behavioral: Positive for behavioral problems.    There were no vitals taken for this visit.There is no height or weight on file to calculate BMI.  General Appearance:  Eye Contact:    Speech:  Slow  Volume:  Decreased  Mood:  fair  Affect:  Congruent  Thought Process: linear  Orientation:  Full (Time, Place, and Person)  Thought Content:  Rumination  Suicidal Thoughts:  No  Homicidal Thoughts:  No  Memory:  Immediate;   Fair Recent;   Fair  Judgement:  Fair  Insight:  Shallow  Psychomotor Activity:  Decreased  Concentration:  Concentration: Fair and Attention Span: Fair  Recall:  [employer] of Knowledge:Fair  Language: Fair  Akathisia:  No  Handed:  AIMS (if  indicated):    Assets:  Desire for Improvement Social Support  ADL's:  Intact  Cognition: present  Sleep:  Fair   Screenings:   Assessment and Plan: as follows  Mood disorder unspecified: sounded calm, but gets edgy with family when he demands things, sister was satisfied with abilify but had side effects. seroquel helping sleep but not mood.  Will increase to 100mg  Consider low dose lamictal, discussed and cautioned about rash and to stop if any concerns  GAD: fluctuates, continue therapy and zoloft countinue counselling for anger and mood symptoms  Autsim: does not like change, discussed different softer approach to change to avoid conflicts  Patient has difficulty with change, discussed with sister to have small rules he gets used to it and adapt and learn with care giving and activities.   I discussed the assessment and treatment plan with the patient. The patient was provided an opportunity to ask questions and all were answered. The patient agreed with the plan and demonstrated an understanding of the instructions.   The patient was advised to call back or seek an in-person evaluation if the symptoms worsen or if the condition fails to improve as anticipated. Fu 3-4 weeks I provided 20 minutes of non-face-to-face time during this encounter. Merian Capron, MD 7/8/20219:43 AM

## 2019-08-22 ENCOUNTER — Telehealth (HOSPITAL_COMMUNITY): Payer: Self-pay | Admitting: Clinical

## 2019-08-22 NOTE — Telephone Encounter (Signed)
Called patient to schedule appt. Per [facility] location due to pt address - left voicemail.

## 2019-09-05 ENCOUNTER — Other Ambulatory Visit: Payer: Self-pay | Admitting: Gastroenterology

## 2019-09-05 ENCOUNTER — Other Ambulatory Visit (HOSPITAL_COMMUNITY): Payer: Self-pay | Admitting: Psychiatry

## 2019-09-06 ENCOUNTER — Telehealth (HOSPITAL_COMMUNITY): Payer: Self-pay | Admitting: Psychiatry

## 2019-09-06 MED ORDER — ARIPIPRAZOLE 5 MG PO TABS: 5.0000 mg | ORAL_TABLET | Freq: Every day | ORAL | 0 refills | Status: DC

## 2019-09-06 NOTE — Telephone Encounter (Signed)
Spoke with guardian and explained to her what Dr. De Nurse stated in previous message. The did not have any Abilify at home, so I sent some to the pharmacy. Nothing further is needed at this time.

## 2019-09-06 NOTE — Telephone Encounter (Signed)
Do you want to move his next appt sooner? Next apt 8/17 Guardian calling states that meds are frying his brian. He is really fidgety. Can't sit still. Going to bed at 1-2am and waking up at 7am. He is not acting himself.  Guardian wants him to go back on abilify at a low dose. She states they can deal with the tremors in his hands and feet.   CB# [phone]

## 2019-09-06 NOTE — Telephone Encounter (Signed)
Can start low dose abilify 5mg , they should have some at home. Lower seroquel to half at night and will connect next appointment ER if symptoms are worsening or other concerns

## 2019-09-14 ENCOUNTER — Other Ambulatory Visit (HOSPITAL_COMMUNITY): Payer: Self-pay | Admitting: Psychiatry

## 2019-09-20 ENCOUNTER — Telehealth (HOSPITAL_COMMUNITY): Payer: Medicare Other | Admitting: Psychiatry

## 2019-10-05 ENCOUNTER — Other Ambulatory Visit (HOSPITAL_COMMUNITY): Payer: Self-pay | Admitting: Psychiatry

## 2019-11-05 ENCOUNTER — Other Ambulatory Visit (HOSPITAL_COMMUNITY): Payer: Self-pay | Admitting: Psychiatry

## 2019-11-14 ENCOUNTER — Other Ambulatory Visit (HOSPITAL_COMMUNITY): Payer: Self-pay | Admitting: Psychiatry

## 2019-12-27 ENCOUNTER — Other Ambulatory Visit (HOSPITAL_COMMUNITY): Payer: Self-pay | Admitting: Psychiatry

## 2020-01-16 ENCOUNTER — Other Ambulatory Visit (HOSPITAL_COMMUNITY): Payer: Self-pay | Admitting: Psychiatry

## 2020-01-23 ENCOUNTER — Other Ambulatory Visit (HOSPITAL_COMMUNITY): Payer: Self-pay | Admitting: Psychiatry

## 2020-02-21 ENCOUNTER — Other Ambulatory Visit (HOSPITAL_COMMUNITY): Payer: Self-pay | Admitting: Psychiatry

## 2020-03-19 ENCOUNTER — Other Ambulatory Visit (HOSPITAL_COMMUNITY): Payer: Self-pay | Admitting: Psychiatry

## 2020-05-04 DIAGNOSIS — Z1509 Genetic susceptibility to other malignant neoplasm: Secondary | ICD-10-CM | POA: Insufficient documentation

## 2021-02-03 HISTORY — PX: COLONOSCOPY: SHX174

## 2021-04-24 ENCOUNTER — Encounter: Payer: Self-pay | Admitting: Physician Assistant

## 2021-04-24 ENCOUNTER — Ambulatory Visit (INDEPENDENT_AMBULATORY_CARE_PROVIDER_SITE_OTHER): Payer: Medicare Other | Admitting: Physician Assistant

## 2021-04-24 VITALS — BP 116/76 | HR 88 | Ht 70.0 in | Wt 260.0 lb

## 2021-04-24 DIAGNOSIS — Z1509 Genetic susceptibility to other malignant neoplasm: Secondary | ICD-10-CM

## 2021-04-24 DIAGNOSIS — Z1507 Genetic susceptibility to malignant neoplasm of urinary tract: Secondary | ICD-10-CM

## 2021-04-24 DIAGNOSIS — R059 Cough, unspecified: Secondary | ICD-10-CM | POA: Diagnosis not present

## 2021-04-24 DIAGNOSIS — K219 Gastro-esophageal reflux disease without esophagitis: Secondary | ICD-10-CM | POA: Diagnosis not present

## 2021-04-24 DIAGNOSIS — K2100 Gastro-esophageal reflux disease with esophagitis, without bleeding: Secondary | ICD-10-CM

## 2021-04-24 DIAGNOSIS — K760 Fatty (change of) liver, not elsewhere classified: Secondary | ICD-10-CM

## 2021-04-24 DIAGNOSIS — R1110 Vomiting, unspecified: Secondary | ICD-10-CM

## 2021-04-24 DIAGNOSIS — R1310 Dysphagia, unspecified: Secondary | ICD-10-CM

## 2021-04-24 MED ORDER — SUCRALFATE 1 GM/10ML PO SUSP: 1.0000 g | Freq: Three times a day (TID) | ORAL | 2 refills | Status: DC

## 2021-04-24 MED ORDER — PLENVU 140 G PO SOLR: 1.0000 | ORAL | 0 refills | Status: DC

## 2021-04-24 MED ORDER — ONDANSETRON HCL 4 MG PO TABS: 4.0000 mg | ORAL_TABLET | Freq: Four times a day (QID) | ORAL | 0 refills | Status: DC | PRN

## 2021-04-24 NOTE — Patient Instructions (Signed)
If you are age 44 or younger, your body mass index should be between 19-25. Your Body mass index is 37.31 kg/m?[name] Kitchen If this is out of the aformentioned range listed, please consider follow up with your Primary Care Provider.  ?________________________________________________________ ? ?The [facility] providers would like to encourage you to use [facility] to communicate with providers for non-urgent requests or questions.  Due to long hold times on the telephone, sending your provider a message by [facility] ([place]) may be a faster and more efficient way to get a response.  Please allow 48 business hours for a response.  Please remember that this is for non-urgent requests.  ?_______________________________________________________ ? ?Continue Omeprazole 40 mg 1 capsule twice daily before meals ? ?START Carafate 1 gram before meals and at bedtime ? ?START Ondansetron 4 mg 1 tablet every 6 hours as needed for nausea/vomiting ? ?Follow an Anti-GERD Diet and Reflux precautions.  ? ?Follow up pending the results of your Endoscopy/Colonoscopy. ? ?Thank you for entrusting me with your care and choosing [facility]. ? ?Nicoletta Ba, PA-C  ?

## 2021-04-24 NOTE — Progress Notes (Signed)
Subjective:    Patient ID: Tim Nelson, male    DOB: Nov 07, 1975, 46 y.o.   MRN: [id number]  HPI Tim Nelson is a 46 year old white male, established with Dr. Adela Lank and last seen in April 2021 here.  He is referred back today by primary care/[facility] medicine/Hayden Jon Billings, FNP for evaluation of chronic GERD symptoms which have not been well controlled recently, persistent cough over the past month, intermittent vomiting. Patient's sister who accompanies the patient today and says that she and her husband are taking over as his caregivers, as patient has mild autism.  She says that they underwent genetic testing in 20 22, at [facility] and that [place] tested positive for Lynch syndrome.  Patient has been on twice daily omeprazole 40 mg chronically.  He underwent EGD here in April 2021 for complaints of dysphagia, he was not noted to have a stricture, but due to complaints did have empiric dilation of the esophagus [place] from 17 to 18 mm, was also noted to have a small lipoma in the gastric body.  Biopsies were taken of the esophagus, negative for eosinophilic esophagitis and had benign squamous mucosa. Colonoscopy here in July 2019 done for family history of colon cancer in the patient's brother with finding of internal hemorrhoids, one 2 to 3 mm polyp in the rectosigmoid which was hyperplastic.  They also mention that he was just diagnosed with fatty liver and that they are being referred to [facility] hepatology but cannot be seen until April.  Patient had an ultrasound done which is visible in care everywhere earlier this week and shows moderate to severe steatosis, consider NASH with elevated LFTs. Most recent LFTs March 2023 AST 53/ALT 72, CBC within normal limits Reviewing labs LFTs were normal in 2021.  Review regarding his current symptoms, he has no lower GI symptoms other than occasional constipation for which she uses stool softeners. Has had a nagging persistent cough over the  past month or so which his family says they primarily notice around times of eating and after eating.  Patient says that he is getting the sensation that food is "hanging" in his chest, he gets some tightness in his chest at times and then this precipitates regurgitation.  Cough symptoms are worse when he lays down especially after eating, and can increase at nighttime. Family says he has had nausea and vomiting but by patient's description this mostly sounds like regurgitation after eating.  No complaints of abdominal pain.  They have had him on a diet recently he has lost about 4 pounds in the past month.  Patient does have morbid obesity with BMI of 37, weight 260. Also with adult onset diabetes mellitus, anxiety/depression, autism.  Review of Systems.Pertinent positive and negative review of systems were noted in the above HPI section.  All other review of systems was otherwise negative.   Outpatient Encounter Medications as of 04/24/2021  Medication Sig   ARIPiprazole (ABILIFY) 5 MG tablet TAKE ONE (1) TABLET EACH DAY   atorvastatin (LIPITOR) 10 MG tablet Take 10 mg by mouth every evening.    cetirizine (ZYRTEC) 10 MG tablet Take 10 mg by mouth daily.   fenofibrate micronized (LOFIBRA) 134 MG capsule Take 134 mg by mouth daily before breakfast.   hydrochlorothiazide (HYDRODIURIL) 25 MG tablet Take 25 mg by mouth 2 (two) times daily.   lamoTRIgine (LAMICTAL) 25 MG tablet TAKE 1 TABLET DAILY FOR 1 WEEK THEN TAKE 2 TABLETS DAILY   lisinopril (ZESTRIL) 5 MG tablet Take  5 mg by mouth daily.   metFORMIN (GLUCOPHAGE) 500 MG tablet Take 500 mg by mouth daily with breakfast.   montelukast (SINGULAIR) 10 MG tablet Take 10 mg by mouth at bedtime.   omeprazole (PRILOSEC) 40 MG capsule TAKE 1 CAPSULE EVERY MORNING AND TAKE 1 CAPSULE AT BEDTIME   ondansetron (ZOFRAN) 4 MG tablet Take 1 tablet (4 mg total) by mouth every 6 (six) hours as needed for nausea or vomiting.   PEG-KCl-NaCl-NaSulf-Na Asc-C  (PLENVU) 140 g SOLR Take 1 kit by mouth as directed.   QUEtiapine (SEROQUEL) 100 MG tablet TAKE ONE TABLET DAILY AT BEDTIME   sucralfate (CARAFATE) 1 GM/10ML suspension Take 10 mLs (1 g total) by mouth 4 (four) times daily -  with meals and at bedtime.   [DISCONTINUED] acetaminophen (TYLENOL) 500 MG tablet Take 1,000 mg by mouth every 6 (six) hours as needed for mild pain or moderate pain.    [DISCONTINUED] aluminum hydroxide-magnesium carbonate (GAVISCON) 95-358 MG/15ML SUSP Take 15 mLs by mouth as needed.   [DISCONTINUED] Ca Carbonate-Mag Hydroxide (ROLAIDS) 550-110 MG CHEW Chew 1 tablet by mouth. Reported on 04/19/2015   [DISCONTINUED] famotidine (PEPCID) 20 MG tablet Take 1 tablet (20 mg total) by mouth daily.   [DISCONTINUED] fexofenadine (ALLEGRA) 180 MG tablet Take 180 mg by mouth daily.   [DISCONTINUED] pantoprazole (PROTONIX) 40 MG tablet TAKE ONE TABLET BY MOUTH TWICE DAILY   [DISCONTINUED] sertraline (ZOLOFT) 100 MG tablet Take 1 tablet (100 mg total) by mouth daily.   [DISCONTINUED] simethicone (MYLICON) 125 MG chewable tablet Chew 125 mg by mouth every 6 (six) hours as needed for flatulence (as needed.).   Facility-Administered Encounter Medications as of 04/24/2021  Medication   0.9 %  sodium chloride infusion   Allergies  Allergen Reactions   Cefaclor Shortness Of Breath and Swelling   Dimetapp Dm Cold-Cough [Phenylephrine-Bromphen-Dm] Shortness Of Breath and Swelling   Furosemide Rash   Patient Active Problem List   Diagnosis Date Noted   Cough 02/11/2013   Stricture and stenosis of esophagus 12/11/2010   Family history of malignant neoplasm of gastrointestinal tract 10/23/2010   ANXIETY DEPRESSION 04/25/2008   HEMORRHOIDS 04/25/2008   GERD 04/25/2008   POLYP, COLON 05/13/2007   Social History   Socioeconomic History   Marital status: Single    Spouse name: Not on file   Number of children: Not on file   Years of education: Not on file   Highest education level:  Not on file  Occupational History   Occupation: Unemployed   Tobacco Use   Smoking status: Never    Passive exposure: Never   Smokeless tobacco: Never  Vaping Use   Vaping Use: Never used  Substance and Sexual Activity   Alcohol use: No   Drug use: No   Sexual activity: Not on file  Other Topics Concern   Not on file  Social History Narrative   Not on file   Social Determinants of Health   Financial Resource Strain: Not on file  Food Insecurity: Not on file  Transportation Needs: Not on file  Physical Activity: Not on file  Stress: Not on file  Social Connections: Not on file  Intimate Partner Violence: Not on file    Tim Nelson family history includes Brain cancer in his maternal aunt; Colon cancer in his mother; Colon cancer (age of onset: 18) in his brother; Colon cancer (age of onset: 55) in his maternal grandfather; Colon polyps in his mother; Congestive Heart Failure in his father;  Dementia in his father; Diabetes in his mother; Esophageal cancer in his maternal grandfather; Irritable bowel syndrome in his mother; Kidney cancer in his mother; Liver cancer in his brother and maternal aunt; Ovarian cancer in his maternal aunt; Stomach cancer in his maternal aunt.      Objective:    Vitals:   04/24/21 1514  BP: 116/76  Pulse: 88    Physical Exam Well-developed well-nourished obese white male in no acute distress.  Accompanied by his sister and brother-in-law  Weight, 260 BMI 37.3  HEENT; nontraumatic normocephalic, EOMI, PE R LA, sclera anicteric. Oropharynx;  not examined today Neck; supple, no JVD Cardiovascular; regular rate and rhythm with S1-S2, no murmur rub or gallop Pulmonary; Clear bilaterally Abdomen; soft, obese, protuberant nontender, nondistended, no palpable mass or hepatosplenomegaly, bowel sounds are active Rectal; not done today Skin; benign exam, no jaundice rash or appreciable lesions Extremities; no clubbing cyanosis or edema skin warm and  dry Neuro/Psych; alert and oriented x4, grossly nonfocal mood and affect appropriate        Assessment & Plan:   #38 46 year old white male with history of chronic GERD, on twice daily PPI, presenting with 1 month history of daily cough which seems to be associated with eating, dysphagia, with sensation of food "hanging" and frequent regurgitation with eating Has had some episodes of nausea and vomiting but by patient history primarily symptoms inconsistent with regurgitation after meals and with meals  Suspect exacerbation of GERD, rule out component of esophagitis, rule out stricture, rule out dysmotility. Not sure that his cough is all secondary to reflux.  Consider gastroparesis, patient on psychotropics  #2 Lynch syndrome-patient had genetic testing done April 2022 at [facility].  I cannot see the results of those labs they have been told that he has Lynch syndrome.  Last colonoscopy July 2019, 1 hyperplastic polyp  #3 family history of colon cancer in patient's brother, mother and maternal grandfather #4 morbid obesity BMI 37 #5.  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  Hyperlipidemia #7.  Adult onset diabetes mellitus #8.  Autism #9.  Anxiety/depression  Plan; GERD diet and tighten up on GERD precautions.  Advised patient to remain upright for 2 to 3 hours after eating, and to elevate his back 45 degrees to sleep.  Continue omeprazole 40 mg p.o. twice daily AC for now Add course of Carafate liquid 1 g between meals and at bedtime over the next month  Patient will be scheduled for EGD with possible esophageal dilation, and also for follow-up colonoscopy given diagnosis of Lynch syndrome.  Procedures will be scheduled with Dr. Adela Lank.  Procedures were discussed in detail with the patient and his family including indications risks and benefits and they are agreed to proceed. We discussed initial management of [place]/steatosis with focused efforts towards weight  loss, daily exercise, tight control of diabetes and hyperlipidemia.  They do have an upcoming appointment at [facility] hepatology, and will keep that appointment  Kenda Kloehn Oswald Hillock PA-C 04/24/2021   Cc: Venia Minks, NP

## 2021-04-25 ENCOUNTER — Telehealth: Payer: Self-pay

## 2021-04-25 NOTE — Telephone Encounter (Signed)
Sucralfate has been approved starting 02/03/2021 to 04/25/2022 ?

## 2021-04-25 NOTE — Telephone Encounter (Signed)
Prior authorization has been started for Sucralfate suspension. ?

## 2021-04-25 NOTE — Progress Notes (Signed)
Agree with assessment and plan as outlined.  

## 2021-05-07 ENCOUNTER — Institutional Professional Consult (permissible substitution): Payer: Medicare Other | Admitting: Pulmonary Disease

## 2021-05-19 ENCOUNTER — Encounter: Payer: Self-pay | Admitting: Certified Registered Nurse Anesthetist

## 2021-05-20 ENCOUNTER — Ambulatory Visit (INDEPENDENT_AMBULATORY_CARE_PROVIDER_SITE_OTHER): Payer: Medicare Other | Admitting: Pulmonary Disease

## 2021-05-20 ENCOUNTER — Telehealth: Payer: Self-pay | Admitting: Pulmonary Disease

## 2021-05-20 ENCOUNTER — Encounter: Payer: Self-pay | Admitting: Pulmonary Disease

## 2021-05-20 VITALS — BP 115/78 | HR 90 | Temp 98.3°F | Ht 70.0 in | Wt 254.0 lb

## 2021-05-20 DIAGNOSIS — R0683 Snoring: Secondary | ICD-10-CM

## 2021-05-20 DIAGNOSIS — R0600 Dyspnea, unspecified: Secondary | ICD-10-CM

## 2021-05-20 DIAGNOSIS — R0681 Apnea, not elsewhere classified: Secondary | ICD-10-CM

## 2021-05-20 MED ORDER — ADVAIR HFA 115-21 MCG/ACT IN AERO: 2.0000 | INHALATION_SPRAY | Freq: Two times a day (BID) | RESPIRATORY_TRACT | 12 refills | Status: DC

## 2021-05-20 NOTE — Telephone Encounter (Signed)
Called [facility] but I could not speak with anyone. Left a detailed message stating that a new RX has been sent to them.  ? ?Will close encounter.  ?

## 2021-05-20 NOTE — Patient Instructions (Addendum)
We will check a sleep study for concern of sleep apnea ? ?We will check an ultra sound of your heart to evaluate for shortness of breath ? ?Try advair inhaer 2 puffs twice daily ?- rinse mouth out after each use ?- monitor for improvements in cough a wheezing ? ?We will check pulmonary function tests at follow up in 3 months.  ?

## 2021-05-20 NOTE — Progress Notes (Signed)
? ?Synopsis: Referred in April 2023 for shortness of breath ? ?Subjective:  ? ?PATIENT ID: Tim Nelson GENDER: male DOB: 1976/01/07, MRN: [id number] ? ?HPI ? ?Chief Complaint  ?Patient presents with  ? Consult  ?  States SOB improving. Coughs a lot, per sister, and, at night, notices he stops breathing. Sister states he seems to exert effort to breathe. ?  ? ?Tim Nelson is a 46 year old male, never smoker with GERD, hypertension, and seasonal allergies who is referred to pulmonary clinic for shortness of breath.  ? ?He is accompanied by his sister and brother in law. He has history of autism and developmental delay. His mother was his primary care taker until she was recently placed on hospice so his sister is now helping with his care.  ? ?He has noticed increasing exertional dyspnea with intermittent wheezing. He also notices the wheezing when he lays down. He has significant weight gain over recent years. There is also concern for witnessed apneas in his sleep and snoring.  ? ?He report increased abdominal swelling and scrotal swelling along with hemorrhoids. There is concern for fatty liver disease and he is being evaluated at [facility] by Dr. Beverley Fiedler of hepatology. He is followed by [facility] for history of GERD and history concerning for Lynch Syndrome.  ? ?He reports no child hood issues with his breathing. He is a never smoker. He does have second hand smoke exposure in childhood. He works occasionally for a [employer]ilities [employer] and was not wearing protective masks/respirators.  ? ?Past Medical History:  ?Diagnosis Date  ? Allergy   ? Anxiety and depression   ? Autism   ? Dysthymic disorder   ? Esophageal stricture   ? Esophagitis, unspecified   ? GERD (gastroesophageal reflux disease)   ? History of chronic cough   ? dry cough-sleeps with elevated head of bed.  ? Hx of colonic polyps   ? Hyperlipemia   ? Hypertension   ? Seasonal allergies   ?  ? ?Family History   ?Problem Relation Age of Onset  ? Colon cancer Mother   ? Irritable bowel syndrome Mother   ? Colon polyps Mother   ? Diabetes Mother   ? Kidney cancer Mother   ? Congestive Heart Failure Father   ? Dementia Father   ? Colon cancer Brother 83  ? Liver cancer Brother   ? Ovarian cancer Maternal Aunt   ? Liver cancer Maternal Aunt   ? Stomach cancer Maternal Aunt   ? Brain cancer Maternal Aunt   ? Colon cancer Maternal Grandfather 21  ? Esophageal cancer Maternal Grandfather   ?  ? ?Social History  ? ?Socioeconomic History  ? Marital status: Single  ?  Spouse name: Not on file  ? Number of children: Not on file  ? Years of education: Not on file  ? Highest education level: Not on file  ?Occupational History  ? Occupation: Unemployed   ?Tobacco Use  ? Smoking status: Never  ?  Passive exposure: Never  ? Smokeless tobacco: Never  ?Vaping Use  ? Vaping Use: Never used  ?Substance and Sexual Activity  ? Alcohol use: No  ? Drug use: No  ? Sexual activity: Not on file  ?Other Topics Concern  ? Not on file  ?Social History Narrative  ? Not on file  ? ?Social Determinants of Health  ? ?Financial Resource Strain: Not on file  ?Food Insecurity: Not  on file  ?Transportation Needs: Not on file  ?Physical Activity: Not on file  ?Stress: Not on file  ?Social Connections: Not on file  ?Intimate Partner Violence: Not on file  ?  ? ?Allergies  ?Allergen Reactions  ? Cefaclor Shortness Of Breath and Swelling  ? Dimetapp Dm Cold-Cough [Phenylephrine-Bromphen-Dm] Shortness Of Breath and Swelling  ? Furosemide Rash  ?  ? ?Outpatient Medications Prior to Visit  ?Medication Sig Dispense Refill  ? ARIPiprazole (ABILIFY) 5 MG tablet TAKE ONE (1) TABLET EACH DAY 30 tablet 0  ? atorvastatin (LIPITOR) 10 MG tablet Take 10 mg by mouth every evening.     ? cetirizine (ZYRTEC) 10 MG tablet Take 10 mg by mouth daily.    ? fenofibrate micronized (LOFIBRA) 134 MG capsule Take 134 mg by mouth daily before breakfast.    ? hydrochlorothiazide  (HYDRODIURIL) 25 MG tablet Take 25 mg by mouth 2 (two) times daily.    ? lamoTRIgine (LAMICTAL) 25 MG tablet TAKE 1 TABLET DAILY FOR 1 WEEK THEN TAKE 2 TABLETS DAILY 60 tablet 0  ? lisinopril (ZESTRIL) 5 MG tablet Take 5 mg by mouth daily.    ? metFORMIN (GLUCOPHAGE) 500 MG tablet Take 500 mg by mouth daily with breakfast.    ? montelukast (SINGULAIR) 10 MG tablet Take 10 mg by mouth at bedtime.    ? omeprazole (PRILOSEC) 40 MG capsule TAKE 1 CAPSULE EVERY MORNING AND TAKE 1 CAPSULE AT BEDTIME 180 capsule 1  ? ondansetron (ZOFRAN) 4 MG tablet Take 1 tablet (4 mg total) by mouth every 6 (six) hours as needed for nausea or vomiting. 20 tablet 0  ? PEG-KCl-NaCl-NaSulf-Na Asc-C (PLENVU) 140 g SOLR Take 1 kit by mouth as directed. 1 each 0  ? QUEtiapine Fumarate (SEROQUEL PO) Take 75 mg by mouth at bedtime.    ? sucralfate (CARAFATE) 1 GM/10ML suspension Take 10 mLs (1 g total) by mouth 4 (four) times daily -  with meals and at bedtime. 1200 mL 2  ? QUEtiapine (SEROQUEL) 100 MG tablet TAKE ONE TABLET DAILY AT BEDTIME 30 tablet 0  ? ?Facility-Administered Medications Prior to Visit  ?Medication Dose Route Frequency Provider Last Rate Last Admin  ? 0.9 %  sodium chloride infusion  500 mL Intravenous Once Armbruster, Carlota Raspberry, MD      ? ?Review of Systems  ?Constitutional:  Negative for chills, fever, malaise/fatigue and weight loss.  ?HENT:  Positive for congestion and sore throat. Negative for sinus pain.   ?Eyes: Negative.   ?Respiratory:  Positive for cough, shortness of breath and wheezing. Negative for hemoptysis and sputum production.   ?Cardiovascular:  Negative for chest pain, palpitations, orthopnea, claudication and leg swelling.  ?Gastrointestinal:  Positive for heartburn. Negative for abdominal pain, nausea and vomiting.  ?Genitourinary: Negative.   ?Musculoskeletal:  Negative for joint pain and myalgias.  ?Skin:  Negative for rash.  ?Neurological:  Negative for weakness.  ?Endo/Heme/Allergies:  Positive for  environmental allergies.  ?Psychiatric/Behavioral: Negative.    ? ?Objective:  ? ?Vitals:  ? 05/20/21 1336  ?BP: 115/78  ?Pulse: 90  ?Temp: 98.3 ?F (36.8 ?C)  ?TempSrc: Oral  ?SpO2: 96%  ?Weight: 254 lb (115.2 kg)  ?Height: $RemoveB'5\' 10"'OEqcrtXk$  (1.778 m)  ? ?Physical Exam ?Constitutional:   ?   General: He is not in acute distress. ?   Appearance: He is obese.  ?HENT:  ?   Head: Normocephalic and atraumatic.  ?Eyes:  ?   Extraocular Movements: Extraocular movements intact.  ?  Conjunctiva/sclera: Conjunctivae normal.  ?   Pupils: Pupils are equal, round, and reactive to light.  ?Cardiovascular:  ?   Rate and Rhythm: Normal rate and regular rhythm.  ?   Pulses: Normal pulses.  ?   Heart sounds: Normal heart sounds. No murmur heard. ?Pulmonary:  ?   Effort: Pulmonary effort is normal.  ?   Breath sounds: Normal breath sounds.  ?Abdominal:  ?   General: Bowel sounds are normal.  ?   Palpations: Abdomen is soft.  ?Musculoskeletal:  ?   Right lower leg: No edema.  ?   Left lower leg: No edema.  ?Lymphadenopathy:  ?   Cervical: No cervical adenopathy.  ?Skin: ?   General: Skin is warm and dry.  ?Neurological:  ?   General: No focal deficit present.  ?   Mental Status: He is alert.  ?Psychiatric:     ?   Mood and Affect: Mood normal.     ?   Behavior: Behavior normal.     ?   Thought Content: Thought content normal.     ?   Judgment: Judgment normal.  ? ?CBC ?   ?Component Value Date/Time  ? WBC 7.5 07/08/2019 1604  ? RBC 5.10 07/08/2019 1604  ? HGB 14.5 07/08/2019 1604  ? HCT 44.6 07/08/2019 1604  ? PLT 328 07/08/2019 1604  ? MCV 87.5 07/08/2019 1604  ? MCH 28.4 07/08/2019 1604  ? MCHC 32.5 07/08/2019 1604  ? RDW 12.6 07/08/2019 1604  ? LYMPHSABS 2.2 05/26/2019 1439  ? MONOABS 0.8 05/26/2019 1439  ? EOSABS 0.2 05/26/2019 1439  ? BASOSABS 0.1 05/26/2019 1439  ? ?Chest imaging: ?CXR 04/12/21 ?Cardiovascular: Cardiac silhouette and pulmonary vasculature are within normal limits.  ?Mediastinum: Within normal limits.  ?Lungs/pleura:  Appropriate pulmonary expansion without focal consolidative airspace disease identified. No overt pulmonary edema. No pleural effusions or discernible pneumothorax.  ?Upper abdomen: Visualized portions are unremarkable.

## 2021-05-27 ENCOUNTER — Encounter: Payer: Self-pay | Admitting: Gastroenterology

## 2021-05-27 ENCOUNTER — Ambulatory Visit (AMBULATORY_SURGERY_CENTER): Payer: Medicare Other | Admitting: Gastroenterology

## 2021-05-27 VITALS — BP 117/75 | HR 98 | Temp 99.3°F | Resp 16 | Ht 70.0 in | Wt 260.0 lb

## 2021-05-27 DIAGNOSIS — Z1509 Genetic susceptibility to other malignant neoplasm: Secondary | ICD-10-CM

## 2021-05-27 DIAGNOSIS — D122 Benign neoplasm of ascending colon: Secondary | ICD-10-CM

## 2021-05-27 DIAGNOSIS — Z1507 Genetic susceptibility to malignant neoplasm of urinary tract: Secondary | ICD-10-CM

## 2021-05-27 DIAGNOSIS — K2970 Gastritis, unspecified, without bleeding: Secondary | ICD-10-CM | POA: Diagnosis not present

## 2021-05-27 DIAGNOSIS — Z800 Family history of malignant neoplasm of digestive organs: Secondary | ICD-10-CM | POA: Diagnosis not present

## 2021-05-27 DIAGNOSIS — K219 Gastro-esophageal reflux disease without esophagitis: Secondary | ICD-10-CM | POA: Diagnosis not present

## 2021-05-27 DIAGNOSIS — K2950 Unspecified chronic gastritis without bleeding: Secondary | ICD-10-CM | POA: Diagnosis not present

## 2021-05-27 MED ORDER — SODIUM CHLORIDE 0.9 % IV SOLN: 500.0000 mL | Freq: Once | INTRAVENOUS | Status: DC

## 2021-05-27 MED ORDER — METOCLOPRAMIDE HCL 5 MG PO TABS: 5.0000 mg | ORAL_TABLET | Freq: Three times a day (TID) | ORAL | 0 refills | Status: DC

## 2021-05-27 NOTE — Progress Notes (Signed)
Pt's states no medical or surgical changes since previsit or office visit. 

## 2021-05-27 NOTE — Progress Notes (Signed)
Called to room to assist during endoscopic procedure.  Patient ID and intended procedure confirmed with present staff. Received instructions for my participation in the procedure from the performing physician.  

## 2021-05-27 NOTE — Progress Notes (Signed)
1457 HR > 100 with esmolol 25 mg given IV, MD updated, vss  ?

## 2021-05-27 NOTE — Progress Notes (Signed)
Report given to PACU, vss 

## 2021-05-27 NOTE — Progress Notes (Signed)
[facility] History and Physical ? ? ?Primary Care Physician:  Zara Chess, NP ? ? ?Reason for Procedure:   GERD, history of Lynch syndrome with strong FH of colon cancer ? ?Plan:    EGD and colonoscopy ? ? ? ? ?HPI: Tim Nelson is a 46 y.o. male  here for EGD to evaluate GERD and history of lynch syndrome, and colonoscopy surveillance for history of Lynch. History of GERD with intermittent nausea / vomiting and early satiety - on omeprazole '40mg'$  BID. BAseline constipation - mother and brother had CRC. Otherwise feels well without any cardiopulmonary symptoms.  ?He is here with his aunt today - I have discussed risks / benefits of these procedures and anesthesia with them, and they want to proceed. ? ?Past Medical History:  ?Diagnosis Date  ? Allergy   ? Anxiety and depression   ? Autism   ? Dysthymic disorder   ? Esophageal stricture   ? Esophagitis, unspecified   ? GERD (gastroesophageal reflux disease)   ? History of chronic cough   ? dry cough-sleeps with elevated head of bed.  ? Hx of colonic polyps   ? Hyperlipemia   ? Hypertension   ? Seasonal allergies   ? ? ?Past Surgical History:  ?Procedure Laterality Date  ? BRAVO PH STUDY N/A 03/28/2013  ? Procedure: BRAVO [place] STUDY;  Surgeon: Inda Castle, MD;  Location: [facility];  Service: Endoscopy;  Laterality: N/A;  ? COLONSCOPY  2014  ? hx. polyps with past testing  ? DENTAL SURGERY    ? ESOPHAGEAL MANOMETRY N/A 04/25/2013  ? Procedure: ESOPHAGEAL MANOMETRY (EM);  Surgeon: Inda Castle, MD;  Location: [facility];  Service: Endoscopy;  Laterality: N/A;  ? ESOPHAGOGASTRODUODENOSCOPY N/A 03/28/2013  ? Procedure: ESOPHAGOGASTRODUODENOSCOPY (EGD);  Surgeon: Inda Castle, MD;  Location: [facility];  Service: Endoscopy;  Laterality: N/A;  ? HERNIA REPAIR  AS INFANT  ? groin  ? ? ?Prior to Admission medications   ?Medication Sig Start Date End Date Taking? Authorizing Provider  ?ARIPiprazole (ABILIFY) 5 MG tablet TAKE ONE (1) TABLET EACH  DAY 02/21/20  Yes Merian Capron, MD  ?atorvastatin (LIPITOR) 10 MG tablet Take 10 mg by mouth every evening.    Yes [provider]  ?cetirizine (ZYRTEC) 10 MG tablet Take 10 mg by mouth daily.   Yes [provider]  ?fenofibrate micronized (LOFIBRA) 134 MG capsule Take 134 mg by mouth daily before breakfast.   Yes [provider]  ?hydrochlorothiazide (HYDRODIURIL) 25 MG tablet Take 25 mg by mouth 2 (two) times daily.   Yes [provider]  ?lisinopril (ZESTRIL) 5 MG tablet Take 5 mg by mouth daily.   Yes [provider]  ?metFORMIN (GLUCOPHAGE) 500 MG tablet Take 500 mg by mouth daily with breakfast.   Yes [provider]  ?montelukast (SINGULAIR) 10 MG tablet Take 10 mg by mouth at bedtime.   Yes [provider]  ?omeprazole (PRILOSEC) 40 MG capsule TAKE 1 CAPSULE EVERY MORNING AND TAKE 1 CAPSULE AT BEDTIME 09/05/19  Yes Gianlucca Szymborski, Carlota Raspberry, MD  ?QUEtiapine Fumarate (SEROQUEL PO) Take 75 mg by mouth at bedtime.   Yes [provider]  ?fluticasone-salmeterol (ADVAIR HFA) 115-21 MCG/ACT inhaler Inhale 2 puffs into the lungs 2 (two) times daily. 05/20/21   Freddi Starr, MD  ?lamoTRIgine (LAMICTAL) 25 MG tablet TAKE 1 TABLET DAILY FOR 1 WEEK THEN TAKE 2 TABLETS DAILY 09/05/19   Merian Capron, MD  ?ondansetron (ZOFRAN) 4 MG tablet  Take 1 tablet (4 mg total) by mouth every 6 (six) hours as needed for nausea or vomiting. 04/24/21   Esterwood, Amy S, PA-C  ?sucralfate (CARAFATE) 1 GM/10ML suspension Take 10 mLs (1 g total) by mouth 4 (four) times daily -  with meals and at bedtime. 04/24/21 05/24/21  Esterwood, Amy S, PA-C  ? ? ?Current Outpatient Medications  ?Medication Sig Dispense Refill  ? ARIPiprazole (ABILIFY) 5 MG tablet TAKE ONE (1) TABLET EACH DAY 30 tablet 0  ? atorvastatin (LIPITOR) 10 MG tablet Take 10 mg by mouth every evening.     ? cetirizine (ZYRTEC) 10 MG tablet Take 10 mg by mouth daily.    ? fenofibrate micronized (LOFIBRA) 134 MG  capsule Take 134 mg by mouth daily before breakfast.    ? hydrochlorothiazide (HYDRODIURIL) 25 MG tablet Take 25 mg by mouth 2 (two) times daily.    ? lisinopril (ZESTRIL) 5 MG tablet Take 5 mg by mouth daily.    ? metFORMIN (GLUCOPHAGE) 500 MG tablet Take 500 mg by mouth daily with breakfast.    ? montelukast (SINGULAIR) 10 MG tablet Take 10 mg by mouth at bedtime.    ? omeprazole (PRILOSEC) 40 MG capsule TAKE 1 CAPSULE EVERY MORNING AND TAKE 1 CAPSULE AT BEDTIME 180 capsule 1  ? QUEtiapine Fumarate (SEROQUEL PO) Take 75 mg by mouth at bedtime.    ? fluticasone-salmeterol (ADVAIR HFA) 115-21 MCG/ACT inhaler Inhale 2 puffs into the lungs 2 (two) times daily. 12 g 12  ? lamoTRIgine (LAMICTAL) 25 MG tablet TAKE 1 TABLET DAILY FOR 1 WEEK THEN TAKE 2 TABLETS DAILY 60 tablet 0  ? ondansetron (ZOFRAN) 4 MG tablet Take 1 tablet (4 mg total) by mouth every 6 (six) hours as needed for nausea or vomiting. 20 tablet 0  ? sucralfate (CARAFATE) 1 GM/10ML suspension Take 10 mLs (1 g total) by mouth 4 (four) times daily -  with meals and at bedtime. 1200 mL 2  ? ?Current Facility-Administered Medications  ?Medication Dose Route Frequency Provider Last Rate Last Admin  ? 0.9 %  sodium chloride infusion  500 mL Intravenous Once Meganne Rita, Carlota Raspberry, MD      ? 0.9 %  sodium chloride infusion  500 mL Intravenous Once Jesyka Slaght, Carlota Raspberry, MD      ? ? ?Allergies as of 05/27/2021 - Review Complete 05/27/2021  ?Allergen Reaction Noted  ? Cefaclor Shortness Of Breath and Swelling 04/26/2008  ? Dimetapp dm cold-cough [phenylephrine-bromphen-dm] Shortness Of Breath and Swelling 06/14/2012  ? Furosemide Rash 05/15/2014  ? ? ?Family History  ?Problem Relation Age of Onset  ? Colon cancer Mother   ? Irritable bowel syndrome Mother   ? Colon polyps Mother   ? Diabetes Mother   ? Kidney cancer Mother   ? Congestive Heart Failure Father   ? Dementia Father   ? Colon cancer Brother 38  ? Liver cancer Brother   ? Ovarian cancer Maternal Aunt   ?  Liver cancer Maternal Aunt   ? Stomach cancer Maternal Aunt   ? Brain cancer Maternal Aunt   ? Colon cancer Maternal Grandfather 61  ? Esophageal cancer Maternal Grandfather   ? ? ?Social History  ? ?Socioeconomic History  ? Marital status: Single  ?  Spouse name: Not on file  ? Number of children: Not on file  ? Years of education: Not on file  ? Highest education level: Not on file  ?Occupational History  ? Occupation: Unemployed   ?Tobacco Use  ?  Smoking status: Never  ?  Passive exposure: Never  ? Smokeless tobacco: Never  ?Vaping Use  ? Vaping Use: Never used  ?Substance and Sexual Activity  ? Alcohol use: No  ? Drug use: No  ? Sexual activity: Not on file  ?Other Topics Concern  ? Not on file  ?Social History Narrative  ? Not on file  ? ?Social Determinants of Health  ? ?Financial Resource Strain: Not on file  ?Food Insecurity: Not on file  ?Transportation Needs: Not on file  ?Physical Activity: Not on file  ?Stress: Not on file  ?Social Connections: Not on file  ?Intimate Partner Violence: Not on file  ? ? ?Review of Systems: ?All other review of systems negative except as mentioned in the HPI. ? ?Physical Exam: ?Vital signs ?BP (!) 137/97   Pulse 90   Temp 99.3 ?F (37.4 ?C) (Temporal)   Resp (!) 26   Ht '5\' 10"'$  (1.778 m)   Wt 260 lb (117.9 kg)   SpO2 93%   BMI 37.31 kg/m?  ? ?General:   Alert,  Well-developed, pleasant and cooperative in NAD ?Lungs:  Clear throughout to auscultation.   ?Heart:  Regular rate and rhythm ?Abdomen:  Soft, nontender and nondistended.   ?Neuro/Psych:  Alert and cooperative. Normal mood and affect. A and O x 3 ? ?Jolly Mango, MD ?[facility] ? ? ?

## 2021-05-27 NOTE — Progress Notes (Signed)
1448 Robinul 0.2 mg IV given due large amount of secretions upon assessment.  Patient experiencing nausea.  MD updated and Zofran 4 mg IV given, vss  ?

## 2021-05-27 NOTE — Op Note (Signed)
[facility] ?Patient Name: Tim Nelson ?Procedure Date: 05/27/2021 2:43 PM ?MRN: [id number] ?Endoscopist: Carlota Raspberry. Havery Moros , MD ?Age: 46 ?Referring MD:  ?Date of Birth: 09-02-1975 ?Gender: Male ?Account #: [id number] ?Procedure:                Upper GI endoscopy ?Indications:              History of gastro-esophageal reflux disease -  ?                          symptoms with omeprazole BID, also with early  ?                          satiety and intermittent nausea / vomiting, Lynch  ?                          syndrome ?Medicines:                Monitored Anesthesia Care ?Procedure:                Pre-Anesthesia Assessment: ?                          - Prior to the procedure, a History and Physical  ?                          was performed, and patient medications and  ?                          allergies were reviewed. The patient's tolerance of  ?                          previous anesthesia was also reviewed. The risks  ?                          and benefits of the procedure and the sedation  ?                          options and risks were discussed with the patient.  ?                          All questions were answered, and informed consent  ?                          was obtained. Prior Anticoagulants: The patient has  ?                          taken no previous anticoagulant or antiplatelet  ?                          agents. ASA Grade Assessment: II - A patient with  ?                          mild systemic disease. After reviewing the risks  ?  and benefits, the patient was deemed in  ?                          satisfactory condition to undergo the procedure. ?                          After obtaining informed consent, the endoscope was  ?                          passed under direct vision. Throughout the  ?                          procedure, the patient's blood pressure, pulse, and  ?                          oxygen saturations were monitored continuously. The   ?                          GIF HQ190 #[id number] was introduced through the  ?                          mouth, and advanced to the second part of duodenum.  ?                          The upper GI endoscopy was accomplished without  ?                          difficulty. The patient tolerated the procedure. ?Scope In: ?Scope Out: ?Findings:                 Esophagogastric landmarks were identified: the  ?                          Z-line was found at 39 cm, the gastroesophageal  ?                          junction was found at 39 cm and the upper extent of  ?                          the gastric folds was found at 39 cm from the  ?                          incisors. ?                          The exam of the esophagus was otherwise normal. No  ?                          erosive changes or Barrett's. Biopsies of EoE not  ?                          taken (previously on last exam and negative) ?  There was a small lipoma in the gastric antrum  ?                          stable in appearance compared to the last exam. ?                          The exam of the stomach was otherwise normal. ?                          Biopsies were taken with a cold forceps in the  ?                          gastric body, at the incisura and in the gastric  ?                          antrum for Helicobacter pylori testing. ?                          The duodenal bulb and second portion of the  ?                          duodenum were normal. ?Complications:            No immediate complications. Estimated blood loss:  ?                          Minimal. Transient oxygen desaturations managed per  ?                          anesthesia, nasal trumpet placed ?Estimated Blood Loss:     Estimated blood loss was minimal. ?Impression:               - Esophagogastric landmarks identified. ?                          - Normal esophagus otherwise ?                          - Benign stable appearing gastric lipoma. ?                           - Normal stomach otherwise - biopsies taken to rule  ?                          out H pylori ?                          - Normal duodenal bulb and second portion of the  ?                          duodenum. ?                          No erosive changes noted. Given patient's early  ?  satiety / nausea / vomiting, possible he has  ?                          underlying gastroparesis. Consider empiric trial of  ?                          Reglan or GES, will discuss with family. ?                          Of note, given transient oxygen desaturations  ?                          managed per anesthesia, recommend sleep study to  ?                          rule out OSA ?Recommendation:           - Patient has a contact number available for  ?                          emergencies. The signs and symptoms of potential  ?                          delayed complications were discussed with the  ?                          patient. Return to normal activities tomorrow.  ?                          Written discharge instructions were provided to the  ?                          patient. ?                          - Resume previous diet. ?                          - Continue present medications. ?                          - Consider trial of Reglan '5mg'$  TID priro to meals  ?                          for one month if no contraindications ?                          - Await pathology results. ?Carlota Raspberry. Havery Moros, MD ?05/27/2021 3:31:53 PM ?This report has been signed electronically. ?

## 2021-05-27 NOTE — Progress Notes (Signed)
1500 Severe airway obstruction noted with desats into 70s.Nasopharyngeal airway size 7.0   placed without trauma, vss MD aware. ?

## 2021-05-27 NOTE — Patient Instructions (Addendum)
Handout on polyps provided  ? ?Await pathology results.  ? ?Continue current medications.  ? ?Barrier cream to the perianal area such as Desitin daily for a few weeks to see if that helps irritation. ? ?YOU HAD AN ENDOSCOPIC PROCEDURE TODAY AT [facility]:   Refer to the procedure report that was given to you for any specific questions about what was found during the examination.  If the procedure report does not answer your questions, please call your gastroenterologist to clarify.  If you requested that your care partner not be given the details of your procedure findings, then the procedure report has been included in a sealed envelope for you to review at your convenience later. ? ?YOU SHOULD EXPECT: Some feelings of bloating in the abdomen. Passage of more gas than usual.  Walking can help get rid of the air that was put into your GI tract during the procedure and reduce the bloating. If you had a lower endoscopy (such as a colonoscopy or flexible sigmoidoscopy) you may notice spotting of blood in your stool or on the toilet paper. If you underwent a bowel prep for your procedure, you may not have a normal bowel movement for a few days. ? ?Please Note:  You might notice some irritation and congestion in your nose or some drainage.  This is from the oxygen used during your procedure.  There is no need for concern and it should clear up in a day or so. ? ?SYMPTOMS TO REPORT IMMEDIATELY: ? ?Following lower endoscopy (colonoscopy or flexible sigmoidoscopy): ? Excessive amounts of blood in the stool ? Significant tenderness or worsening of abdominal pains ? Swelling of the abdomen that is new, acute ? Fever of 100?F or higher ? ?Following upper endoscopy (EGD) ? Vomiting of blood or coffee ground material ? New chest pain or pain under the shoulder blades ? Painful or persistently difficult swallowing ? New shortness of breath ? Fever of 100?F or higher ? Black, tarry-looking stools ? ?For urgent or  emergent issues, a gastroenterologist can be reached at any hour by calling [phone]. ?Do not use MyChart messaging for urgent concerns.  ? ? ?DIET:  We do recommend a small meal at first, but then you may proceed to your regular diet.  Drink plenty of fluids but you should avoid alcoholic beverages for 24 hours. ? ?ACTIVITY:  You should plan to take it easy for the rest of today and you should NOT DRIVE or use heavy machinery until tomorrow (because of the sedation medicines used during the test).   ? ?FOLLOW UP: ?Our staff will call the number listed on your records 48-72 hours following your procedure to check on you and address any questions or concerns that you may have regarding the information given to you following your procedure. If we do not reach you, we will leave a message.  We will attempt to reach you two times.  During this call, we will ask if you have developed any symptoms of COVID 19. If you develop any symptoms (ie: fever, flu-like symptoms, shortness of breath, cough etc.) before then, please call [phone].  If you test positive for Covid 19 in the 2 weeks post procedure, please call and report this information to [place].   ? ?If any biopsies were taken you will be contacted by phone or by letter within the next 1-3 weeks.  Please call us at [phone] if you have not heard about the biopsies in 3  weeks.  ? ? ?SIGNATURES/CONFIDENTIALITY: ?You and/or your care partner have signed paperwork which will be entered into your electronic medical record.  These signatures attest to the fact that that the information above on your After Visit Summary has been reviewed and is understood.  Full responsibility of the confidentiality of this discharge information lies with you and/or your care-partner. ? ? ?

## 2021-05-27 NOTE — Progress Notes (Signed)
[place] to 88 to 89% on room air, albuterol neb given. MD made aware, vss ?

## 2021-05-27 NOTE — Op Note (Signed)
[name] ?Procedure Date: 05/27/2021 2:42 PM ?MRN: [id number] ?Endoscopist: Tim Nelson. Tim Nelson , MD ?Age: 46 ?Referring MD:  ?Date of Birth: 01/22/1976 ?Gender: Male ?Account #: [id number] ?Procedure:                Colonoscopy ?Indications:              Lynch Syndrome - brother / mother with CRC,  ?                          diagnosed with Lynch syndrome in the past few years ?Medicines:                Monitored Anesthesia Care ?Procedure:                Pre-Anesthesia Assessment: ?                          - Prior to the procedure, a History and Physical  ?                          was performed, and patient medications and  ?                          allergies were reviewed. The patient's tolerance of  ?                          previous anesthesia was also reviewed. The risks  ?                          and benefits of the procedure and the sedation  ?                          options and risks were discussed with the patient.  ?                          All questions were answered, and informed consent  ?                          was obtained. Prior Anticoagulants: The patient has  ?                          taken no previous anticoagulant or antiplatelet  ?                          agents. ASA Grade Assessment: II - A patient with  ?                          mild systemic disease. After reviewing the risks  ?                          and benefits, the patient was deemed in  ?                          satisfactory condition to undergo the procedure. ?  After obtaining informed consent, the colonoscope  ?                          was passed under direct vision. Throughout the  ?                          procedure, the patient's blood pressure, pulse, and  ?                          oxygen saturations were monitored continuously. The  ?                          Olympus CF-[id number] (#[id number]) Colonoscope was  ?                          introduced  through the anus and advanced to the the  ?                          cecum, identified by appendiceal orifice and  ?                          ileocecal valve. The colonoscopy was performed  ?                          without difficulty. The patient tolerated the  ?                          procedure well. The quality of the bowel  ?                          preparation was good. The ileocecal valve,  ?                          appendiceal orifice, and rectum were photographed. ?Scope In: 3:04:10 PM ?Scope Out: 3:16:09 PM ?Scope Withdrawal Time: 0 hours 9 minutes 59 seconds  ?Total Procedure Duration: 0 hours 11 minutes 59 seconds  ?Findings:                 The perianal exam was abnormal - there is a benign  ?                          appearing pedunculated cutaneous lesion inferior to  ?                          the anus, near the scrotum. There is also perianal  ?                          irritation in the gluteal cleft with mild skin  ?                          breakdown. ?                          A 3 mm polyp was found in the ascending colon. The  ?  polyp was sessile. The polyp was removed with a  ?                          cold snare. Resection and retrieval were complete. ?                          Internal hemorrhoids were found during retroflexion. ?                          The exam was otherwise without abnormality. ?Complications:            No immediate complications. Estimated blood loss:  ?                          Minimal. Patient received nebulizer post procedure  ?                          per anesthesia - see EGD for details ?Estimated Blood Loss:     Estimated blood loss was minimal. ?Impression:               - Abnormal perianal exam as outlined above. ?                          - One 3 mm polyp in the ascending colon, removed  ?                          with a cold snare. Resected and retrieved. ?                          - Internal hemorrhoids. ?                           - The examination was otherwise normal. ?Recommendation:           - Patient has a contact number available for  ?                          emergencies. The signs and symptoms of potential  ?                          delayed complications were discussed with the  ?                          patient. Return to normal activities tomorrow.  ?                          Written discharge instructions were provided to the  ?                          patient. ?                          - Resume previous diet. ?                          - Continue present medications. ?                          -  Await pathology results. Anticipate repeat  ?                          colonoscopy in 1-2 years ?                          - Dermatology consultation regarding cutaneous  ?                          lesion on perianal exam if not already done - will  ?                          discuss with the patient's Aunt ?                          - Barrier cream to the perianal area such as  ?                          Desitin daily for a few weeks to see if that helps  ?                          irritation ?                          - Additional recommendations per EGD note ?Tim Lipps P. Tim Moros, MD ?05/27/2021 3:24:52 PM ?This report has been signed electronically. ?

## 2021-05-28 ENCOUNTER — Telehealth: Payer: Self-pay

## 2021-05-28 DIAGNOSIS — K629 Disease of anus and rectum, unspecified: Secondary | ICD-10-CM

## 2021-05-28 NOTE — Telephone Encounter (Signed)
Per 05/27/21 procedure report - Dermatology consultation regarding cutaneous lesion on perianal exam if not already done ? ?I called pt's sister, Rosann Auerbach, and left a detailed message asking if pt already sees a dermatologist  or if he needed a referral to one. I asked that they give [place] a call back to discuss. ?

## 2021-05-29 ENCOUNTER — Telehealth: Payer: Self-pay

## 2021-05-29 DIAGNOSIS — R748 Abnormal levels of other serum enzymes: Secondary | ICD-10-CM | POA: Insufficient documentation

## 2021-05-29 DIAGNOSIS — K760 Fatty (change of) liver, not elsewhere classified: Secondary | ICD-10-CM | POA: Insufficient documentation

## 2021-05-29 NOTE — Telephone Encounter (Signed)
?  Follow up Call- ? ? ?  05/27/2021  ?  1:47 PM 06/03/2019  ?  9:07 AM  ?Call back number  ?Post procedure Call Back phone  # [phone]  ?Permission to leave phone message Yes Yes  ?  ? ?Patient questions: ? ?Do you have a fever, pain , or abdominal swelling? No. ?Pain Score  0 * ? ?Have you tolerated food without any problems? Yes.   ? ?Have you been able to return to your normal activities? Yes.   ? ?Do you have any questions about your discharge instructions: ?Diet   No. ?Medications  No. ?Follow up visit  No. ? ?Do you have questions or concerns about your Care? No. ? ?Actions: ?* If pain score is 4 or above: ?No action needed, pain <4. ? ? ?

## 2021-05-30 NOTE — Telephone Encounter (Signed)
Called and spoke with patient's sister. She states that pt does not currently have a dermatologist and would need a referral. I told Rosann Auerbach that we will place the referral and the dermatology office will contact them directly to set up his appt. Rosann Auerbach verbalized understanding and had no concerns at the end of the call. ? ?Ambulatory referral to dermatology in epic. ? ?

## 2021-05-31 ENCOUNTER — Ambulatory Visit (HOSPITAL_COMMUNITY): Payer: Medicare Other

## 2021-06-06 ENCOUNTER — Ambulatory Visit (HOSPITAL_COMMUNITY): Payer: Medicare Other | Attending: Pulmonary Disease

## 2021-06-06 DIAGNOSIS — R0600 Dyspnea, unspecified: Secondary | ICD-10-CM | POA: Diagnosis present

## 2021-06-06 LAB — ECHOCARDIOGRAM COMPLETE
Area-P 1/2: 4.36 cm2
S' Lateral: 3.1 cm

## 2021-06-25 ENCOUNTER — Encounter: Payer: Medicare Other | Admitting: Pulmonary Disease

## 2021-06-26 ENCOUNTER — Other Ambulatory Visit: Payer: Self-pay | Admitting: Gastroenterology

## 2021-06-30 ENCOUNTER — Ambulatory Visit (HOSPITAL_BASED_OUTPATIENT_CLINIC_OR_DEPARTMENT_OTHER): Payer: Medicare Other | Attending: Pulmonary Disease | Admitting: Pulmonary Disease

## 2021-08-21 ENCOUNTER — Other Ambulatory Visit: Payer: Self-pay | Admitting: Gastroenterology

## 2021-08-26 ENCOUNTER — Ambulatory Visit: Payer: Medicare Other | Admitting: Pulmonary Disease

## 2021-08-26 NOTE — Progress Notes (Deleted)
Synopsis: Referred in April 2023 for shortness of breath  Subjective:   PATIENT ID: Tim Nelson GENDER: male DOB: 1975/09/23, MRN: [id number]  HPI  No chief complaint on file.  Tim Nelson is a 46 year old male, never smoker with GERD, hypertension, and seasonal allergies who is referred to pulmonary clinic for shortness of breath.   He is accompanied by his sister and brother in law. He has history of autism and developmental delay. His mother was his primary care taker until she was recently placed on hospice so his sister is now helping with his care.   He has noticed increasing exertional dyspnea with intermittent wheezing. He also notices the wheezing when he lays down. He has significant weight gain over recent years. There is also concern for witnessed apneas in his sleep and snoring.   He report increased abdominal swelling and scrotal swelling along with hemorrhoids. There is concern for fatty liver disease and he is being evaluated at [place] by Dr. Beverley Fiedler of hepatology. He is followed by [facility] for history of GERD and history concerning for Lynch Syndrome.   He reports no child hood issues with his breathing. He is a never smoker. He does have second hand smoke exposure in childhood. He works occasionally for a [employer]ilities [employer] and was not wearing protective masks/respirators.   Past Medical History:  Diagnosis Date   Allergy    Anxiety and depression    Autism    Dysthymic disorder    Esophageal stricture    Esophagitis, unspecified    GERD (gastroesophageal reflux disease)    History of chronic cough    dry cough-sleeps with elevated head of bed.   Hx of colonic polyps    Hyperlipemia    Hypertension    Seasonal allergies      Family History  Problem Relation Age of Onset   Colon cancer Mother    Irritable bowel syndrome Mother    Colon polyps Mother    Diabetes Mother    Kidney cancer Mother     Congestive Heart Failure Father    Dementia Father    Colon cancer Brother 50   Liver cancer Brother    Ovarian cancer Maternal Aunt    Liver cancer Maternal Aunt    Stomach cancer Maternal Aunt    Brain cancer Maternal Aunt    Colon cancer Maternal Grandfather 73   Esophageal cancer Maternal Grandfather      Social History   Socioeconomic History   Marital status: Single    Spouse name: Not on file   Number of children: Not on file   Years of education: Not on file   Highest education level: Not on file  Occupational History   Occupation: Unemployed   Tobacco Use   Smoking status: Never    Passive exposure: Never   Smokeless tobacco: Never  Vaping Use   Vaping Use: Never used  Substance and Sexual Activity   Alcohol use: No   Drug use: No   Sexual activity: Not on file  Other Topics Concern   Not on file  Social History Narrative   Not on file   Social Determinants of Health   Financial Resource Strain: Not on file  Food Insecurity: Not on file  Transportation Needs: Not on file  Physical Activity: Not on file  Stress: Not on file  Social Connections: Not on file  Intimate Partner Violence: Not on file  Allergies  Allergen Reactions   Cefaclor Shortness Of Breath and Swelling   Dimetapp Dm Cold-Cough [Phenylephrine-Bromphen-Dm] Shortness Of Breath and Swelling   Furosemide Rash     Outpatient Medications Prior to Visit  Medication Sig Dispense Refill   ARIPiprazole (ABILIFY) 5 MG tablet TAKE ONE (1) TABLET EACH DAY 30 tablet 0   atorvastatin (LIPITOR) 10 MG tablet Take 10 mg by mouth every evening.      cetirizine (ZYRTEC) 10 MG tablet Take 10 mg by mouth daily.     fenofibrate micronized (LOFIBRA) 134 MG capsule Take 134 mg by mouth daily before breakfast.     fluticasone-salmeterol (ADVAIR HFA) 115-21 MCG/ACT inhaler Inhale 2 puffs into the lungs 2 (two) times daily. 12 g 12   hydrochlorothiazide (HYDRODIURIL) 25 MG tablet Take 25 mg by mouth 2  (two) times daily.     lamoTRIgine (LAMICTAL) 25 MG tablet TAKE 1 TABLET DAILY FOR 1 WEEK THEN TAKE 2 TABLETS DAILY 60 tablet 0   lisinopril (ZESTRIL) 5 MG tablet Take 5 mg by mouth daily.     metFORMIN (GLUCOPHAGE) 500 MG tablet Take 500 mg by mouth daily with breakfast.     metoCLOPramide (REGLAN) 5 MG tablet Take 1 tablet (5 mg total) by mouth 3 (three) times daily before meals. Please call to schedule a follow up office visit with Dr. Havery Moros at your earliest convenience:  [phone]. Thank you. 90 tablet 1   montelukast (SINGULAIR) 10 MG tablet Take 10 mg by mouth at bedtime.     omeprazole (PRILOSEC) 40 MG capsule TAKE 1 CAPSULE EVERY MORNING AND TAKE 1 CAPSULE AT BEDTIME 180 capsule 1   ondansetron (ZOFRAN) 4 MG tablet Take 1 tablet (4 mg total) by mouth every 6 (six) hours as needed for nausea or vomiting. 20 tablet 0   QUEtiapine Fumarate (SEROQUEL PO) Take 75 mg by mouth at bedtime.     sucralfate (CARAFATE) 1 GM/10ML suspension Take 10 mLs (1 g total) by mouth 4 (four) times daily -  with meals and at bedtime. 1200 mL 2   Facility-Administered Medications Prior to Visit  Medication Dose Route Frequency Provider Last Rate Last Admin   0.9 %  sodium chloride infusion  500 mL Intravenous Once Armbruster, Carlota Raspberry, MD       Review of Systems  Constitutional:  Negative for chills, fever, malaise/fatigue and weight loss.  HENT:  Positive for congestion and sore throat. Negative for sinus pain.   Eyes: Negative.   Respiratory:  Positive for cough, shortness of breath and wheezing. Negative for hemoptysis and sputum production.   Cardiovascular:  Negative for chest pain, palpitations, orthopnea, claudication and leg swelling.  Gastrointestinal:  Positive for heartburn. Negative for abdominal pain, nausea and vomiting.  Genitourinary: Negative.   Musculoskeletal:  Negative for joint pain and myalgias.  Skin:  Negative for rash.  Neurological:  Negative for weakness.   Endo/Heme/Allergies:  Positive for environmental allergies.  Psychiatric/Behavioral: Negative.      Objective:   There were no vitals filed for this visit.  Physical Exam Constitutional:      General: He is not in acute distress.    Appearance: He is obese.  HENT:     Head: Normocephalic and atraumatic.  Eyes:     Extraocular Movements: Extraocular movements intact.     Conjunctiva/sclera: Conjunctivae normal.     Pupils: Pupils are equal, round, and reactive to light.  Cardiovascular:     Rate and Rhythm: Normal rate and regular rhythm.  Pulses: Normal pulses.     Heart sounds: Normal heart sounds. No murmur heard. Pulmonary:     Effort: Pulmonary effort is normal.     Breath sounds: Normal breath sounds.  Abdominal:     General: Bowel sounds are normal.     Palpations: Abdomen is soft.  Musculoskeletal:     Right lower leg: No edema.     Left lower leg: No edema.  Lymphadenopathy:     Cervical: No cervical adenopathy.  Skin:    General: Skin is warm and dry.  Neurological:     General: No focal deficit present.     Mental Status: He is alert.  Psychiatric:        Mood and Affect: Mood normal.        Behavior: Behavior normal.        Thought Content: Thought content normal.        Judgment: Judgment normal.    CBC    Component Value Date/Time   WBC 7.5 07/08/2019 1604   RBC 5.10 07/08/2019 1604   HGB 14.5 07/08/2019 1604   HCT 44.6 07/08/2019 1604   PLT 328 07/08/2019 1604   MCV 87.5 07/08/2019 1604   MCH 28.4 07/08/2019 1604   MCHC 32.5 07/08/2019 1604   RDW 12.6 07/08/2019 1604   LYMPHSABS 2.2 05/26/2019 1439   MONOABS 0.8 05/26/2019 1439   EOSABS 0.2 05/26/2019 1439   BASOSABS 0.1 05/26/2019 1439   Chest imaging: CXR 04/12/21 Cardiovascular: Cardiac silhouette and pulmonary vasculature are within normal limits.  Mediastinum: Within normal limits.  Lungs/pleura: Appropriate pulmonary expansion without focal consolidative airspace disease  identified. No overt pulmonary edema. No pleural effusions or discernible pneumothorax.  Upper abdomen: Visualized portions are unremarkable.  Chest wall/osseous structures: Unremarkable.  CXR 07/08/19 Heart and mediastinal contours are within normal limits. No focal opacities or effusions. No acute bony abnormality.  PFT:     No data to display           Labs:  Path:  Echo 06/06/21: LV EF 60-65%. RV systolic function is normal. RV size is normal.   Heart Catheterization:  Assessment & Plan:   No diagnosis found.  Discussion: Tim Nelson is a 46 year old male, never smoker with GERD, hypertension, and seasonal allergies who is referred to pulmonary clinic for shortness of breath.   The etiology of his dyspnea is not clear at this time. The differential includes obstructive sleep apnea vs reactive airways disease/asthma triggered by reflux disease vs heart disease vs liver disease.   We will check a polysomnogram, cardiac echo and pulmonary function tests.   He is to try advair 115-52mg 2 puffs twice daily and monitor for improvements in cough/wheezing/dyspnea.   We will follow up evaluation by hepatologist.   Follow up in 3 months with PFTs.  JFreda Jackson MD [facility] Office: [phone]  Current Outpatient Medications:    ARIPiprazole (ABILIFY) 5 MG tablet, TAKE ONE (1) TABLET EACH DAY, Disp: 30 tablet, Rfl: 0   atorvastatin (LIPITOR) 10 MG tablet, Take 10 mg by mouth every evening. , Disp: , Rfl:    cetirizine (ZYRTEC) 10 MG tablet, Take 10 mg by mouth daily., Disp: , Rfl:    fenofibrate micronized (LOFIBRA) 134 MG capsule, Take 134 mg by mouth daily before breakfast., Disp: , Rfl:    fluticasone-salmeterol (ADVAIR HFA) 115-21 MCG/ACT inhaler, Inhale 2 puffs into the lungs 2 (two) times daily., Disp: 12 g, Rfl: 12  hydrochlorothiazide (HYDRODIURIL) 25 MG tablet, Take 25 mg by mouth 2 (two) times daily., Disp: , Rfl:    lamoTRIgine  (LAMICTAL) 25 MG tablet, TAKE 1 TABLET DAILY FOR 1 WEEK THEN TAKE 2 TABLETS DAILY, Disp: 60 tablet, Rfl: 0   lisinopril (ZESTRIL) 5 MG tablet, Take 5 mg by mouth daily., Disp: , Rfl:    metFORMIN (GLUCOPHAGE) 500 MG tablet, Take 500 mg by mouth daily with breakfast., Disp: , Rfl:    metoCLOPramide (REGLAN) 5 MG tablet, Take 1 tablet (5 mg total) by mouth 3 (three) times daily before meals. Please call to schedule a follow up office visit with Dr. Havery Moros at your earliest convenience:  [phone]. Thank you., Disp: 90 tablet, Rfl: 1   montelukast (SINGULAIR) 10 MG tablet, Take 10 mg by mouth at bedtime., Disp: , Rfl:    omeprazole (PRILOSEC) 40 MG capsule, TAKE 1 CAPSULE EVERY MORNING AND TAKE 1 CAPSULE AT BEDTIME, Disp: 180 capsule, Rfl: 1   ondansetron (ZOFRAN) 4 MG tablet, Take 1 tablet (4 mg total) by mouth every 6 (six) hours as needed for nausea or vomiting., Disp: 20 tablet, Rfl: 0   QUEtiapine Fumarate (SEROQUEL PO), Take 75 mg by mouth at bedtime., Disp: , Rfl:    sucralfate (CARAFATE) 1 GM/10ML suspension, Take 10 mLs (1 g total) by mouth 4 (four) times daily -  with meals and at bedtime., Disp: 1200 mL, Rfl: 2  Current Facility-Administered Medications:    0.9 %  sodium chloride infusion, 500 mL, Intravenous, Once, Armbruster, Carlota Raspberry, MD

## 2021-11-26 ENCOUNTER — Ambulatory Visit: Payer: Medicare Other | Admitting: Pulmonary Disease

## 2021-11-27 ENCOUNTER — Telehealth: Payer: Self-pay | Admitting: Gastroenterology

## 2021-11-27 NOTE — Telephone Encounter (Signed)
Inbound call from patients sister stating that she would like a call back to discuss her brothers symptoms. Patients sister stated that no matter what he eats he is coughing and he almost passes out. Patient is currently scheduled for 11/14 at 3:00 with [facility]. Please advise.

## 2021-11-28 MED ORDER — FAMOTIDINE 20 MG PO TABS: 20.0000 mg | ORAL_TABLET | Freq: Two times a day (BID) | ORAL | 0 refills | Status: DC

## 2021-11-28 NOTE — Telephone Encounter (Signed)
Called and spoke with [facility] regarding Dr. Doyne Keel recommendations. She will have patient add Pepcid 20 mg BID until his appt. Pt already has an appt with pulmonology on 11/7. Rosann Auerbach verbalized understanding and had no concerns at the end of the call.

## 2021-11-28 NOTE — Telephone Encounter (Signed)
Difficult situation. He is on max dose PPI, can try adding pepcid, although it remains unclear what is causing his cough. EGD recently looked okay. Looks like his PCP has seen him and started him on advair and they discussed a possible referral to pulmonary. I recommend he speak with his PCP, they can discuss referral to pulmonary which I think would be next step. He can try adding pepcid '20mg'$  BID in the interim but I think unlikely to make a large difference. We can see him in the clinic otherwise as scheduled. Thanks

## 2021-11-28 NOTE — Telephone Encounter (Signed)
Returned call to pt's sister Rosann Auerbach to review concerns. Rosann Auerbach states that patient has been having coughing spells for the last 2.5 months, but cough has worsened in the last 2-3 weeks. They previously thought that cough was allergy related so pt's PCP prescribed him some allergy meds to try but that did not help. Pt still takes Omeprazole 40 mg BID 30-60 minutes before breakfast and bedtime. Pt has not tried adding Pepcid at bedtime. Rosann Auerbach states that patient had a coughing spell last night after drinking lemonade. Rosann Auerbach states that patient coughs to the point he looses his breath. I told Rosann Auerbach that patient should be following an antireflux diet, I told her that the Lemonade could have exacerbated his reflux symptoms. Rosann Auerbach was advised that pt should avoid spicy, acidic, citrus, chocolate, mints, fruit and fruit juices.  Limit the intake of caffeine, alcohol and Soda. Don't lie down within 3-4 hours of eating.  Elevate the head of bed. Rosann Auerbach also states that patient was coughing up specks of blood, she is aware that the blood could be from irritation in patient's esophagus. I told her that I would send a message to see if you had any recommendations prior to patient's appt on 12/17/21 at 3 pm with Alonza Bogus, PA-C. Please advise, thanks.

## 2021-12-10 ENCOUNTER — Ambulatory Visit (INDEPENDENT_AMBULATORY_CARE_PROVIDER_SITE_OTHER): Payer: Medicare Other | Admitting: Pulmonary Disease

## 2021-12-10 ENCOUNTER — Encounter: Payer: Self-pay | Admitting: Pulmonary Disease

## 2021-12-10 VITALS — BP 124/80 | HR 77 | Ht 70.0 in | Wt 248.6 lb

## 2021-12-10 DIAGNOSIS — R0681 Apnea, not elsewhere classified: Secondary | ICD-10-CM | POA: Diagnosis not present

## 2021-12-10 DIAGNOSIS — R0683 Snoring: Secondary | ICD-10-CM | POA: Diagnosis not present

## 2021-12-10 DIAGNOSIS — R0600 Dyspnea, unspecified: Secondary | ICD-10-CM | POA: Diagnosis not present

## 2021-12-10 MED ORDER — SPIRIVA RESPIMAT 2.5 MCG/ACT IN AERS: 2.0000 | INHALATION_SPRAY | Freq: Every day | RESPIRATORY_TRACT | 0 refills | Status: DC

## 2021-12-10 NOTE — Patient Instructions (Signed)
Try Spiriva 1.71mg 2 puffs twice daily and monitor for improvement in shortnes of breath and wheezing  We will schedule you for home sleep study  Follow up in 2 months with pulmonary function tests

## 2021-12-10 NOTE — Progress Notes (Signed)
Synopsis: Referred in April 2023 for shortness of breath  Subjective:   PATIENT ID: Tim Nelson GENDER: male DOB: Aug 12, 1975, MRN: [id number]  HPI  Chief Complaint  Patient presents with   Follow-up    Wants to discuss getting a HST instead of in lab study. Increased SOB and coughing. No longer using Advair due to tremors.    Tim Nelson is a 46 year old male, never smoker with GERD, hypertension, and seasonal allergies who returns to pulmonary clinic for shortness of breath.   He tried advair but it was stopped because he was getting jittery after 1-2 days of use. He does not recall if his breathing was improved from the medication or not. He does feel his breathing is a bit better since last visit as he is not wheezing as much. He is trying to work on weight loss. His mother recently passed away and he has not been able to do the sleep study yet. Echo 05/2021 is unremarkable. He reports his GERD is improved.   Initial OV 05/20/21 He is accompanied by his sister and brother in law. He has history of autism and developmental delay. His mother was his primary care taker until she was recently placed on hospice so his sister is now helping with his care.   He has noticed increasing exertional dyspnea with intermittent wheezing. He also notices the wheezing when he lays down. He has significant weight gain over recent years. There is also concern for witnessed apneas in his sleep and snoring.   He report increased abdominal swelling and scrotal swelling along with hemorrhoids. There is concern for fatty liver disease and he is being evaluated at [place] by Dr. Beverley Fiedler of hepatology. He is followed by [facility] for history of GERD and history concerning for Lynch Syndrome.   He reports no child hood issues with his breathing. He is a never smoker. He does have second hand smoke exposure in childhood. He works occasionally for a [employer]ilities [employer]  and was not wearing protective masks/respirators.   Past Medical History:  Diagnosis Date   Allergy    Anxiety and depression    Autism    Dysthymic disorder    Esophageal stricture    Esophagitis, unspecified    GERD (gastroesophageal reflux disease)    History of chronic cough    dry cough-sleeps with elevated head of bed.   Hx of colonic polyps    Hyperlipemia    Hypertension    Seasonal allergies      Family History  Problem Relation Age of Onset   Colon cancer Mother    Irritable bowel syndrome Mother    Colon polyps Mother    Diabetes Mother    Kidney cancer Mother    Congestive Heart Failure Father    Dementia Father    Colon cancer Brother 30   Liver cancer Brother    Ovarian cancer Maternal Aunt    Liver cancer Maternal Aunt    Stomach cancer Maternal Aunt    Brain cancer Maternal Aunt    Colon cancer Maternal Grandfather 58   Esophageal cancer Maternal Grandfather      Social History   Socioeconomic History   Marital status: Single    Spouse name: Not on file   Number of children: Not on file   Years of education: Not on file   Highest education level: Not on file  Occupational History   Occupation: Unemployed  Tobacco Use   Smoking status: Never    Passive exposure: Never   Smokeless tobacco: Never  Vaping Use   Vaping Use: Never used  Substance and Sexual Activity   Alcohol use: No   Drug use: No   Sexual activity: Not on file  Other Topics Concern   Not on file  Social History Narrative   Not on file   Social Determinants of Health   Financial Resource Strain: Not on file  Food Insecurity: Not on file  Transportation Needs: Not on file  Physical Activity: Not on file  Stress: Not on file  Social Connections: Not on file  Intimate Partner Violence: Not on file     Allergies  Allergen Reactions   Cefaclor Shortness Of Breath and Swelling   Dimetapp Dm Cold-Cough [Phenylephrine-Bromphen-Dm] Shortness Of Breath and Swelling    Furosemide Rash     Outpatient Medications Prior to Visit  Medication Sig Dispense Refill   ARIPiprazole (ABILIFY) 5 MG tablet TAKE ONE (1) TABLET EACH DAY 30 tablet 0   atorvastatin (LIPITOR) 10 MG tablet Take 10 mg by mouth every evening.      cetirizine (ZYRTEC) 10 MG tablet Take 10 mg by mouth daily.     famotidine (PEPCID) 20 MG tablet Take 1 tablet (20 mg total) by mouth 2 (two) times daily. 60 tablet 0   fenofibrate micronized (LOFIBRA) 134 MG capsule Take 134 mg by mouth daily before breakfast.     hydrochlorothiazide (HYDRODIURIL) 25 MG tablet Take 25 mg by mouth 2 (two) times daily.     lamoTRIgine (LAMICTAL) 25 MG tablet TAKE 1 TABLET DAILY FOR 1 WEEK THEN TAKE 2 TABLETS DAILY 60 tablet 0   lisinopril (ZESTRIL) 5 MG tablet Take 5 mg by mouth daily.     metFORMIN (GLUCOPHAGE) 500 MG tablet Take 500 mg by mouth daily with breakfast.     metoCLOPramide (REGLAN) 5 MG tablet Take 1 tablet (5 mg total) by mouth 3 (three) times daily before meals. Please call to schedule a follow up office visit with Dr. Havery Moros at your earliest convenience:  [phone]. Thank you. 90 tablet 1   montelukast (SINGULAIR) 10 MG tablet Take 10 mg by mouth at bedtime.     omeprazole (PRILOSEC) 40 MG capsule TAKE 1 CAPSULE EVERY MORNING AND TAKE 1 CAPSULE AT BEDTIME 180 capsule 1   ondansetron (ZOFRAN) 4 MG tablet Take 1 tablet (4 mg total) by mouth every 6 (six) hours as needed for nausea or vomiting. 20 tablet 0   QUEtiapine Fumarate (SEROQUEL PO) Take 75 mg by mouth at bedtime.     sucralfate (CARAFATE) 1 GM/10ML suspension Take 10 mLs (1 g total) by mouth 4 (four) times daily -  with meals and at bedtime. 1200 mL 2   fluticasone-salmeterol (ADVAIR HFA) 115-21 MCG/ACT inhaler Inhale 2 puffs into the lungs 2 (two) times daily. 12 g 12   Facility-Administered Medications Prior to Visit  Medication Dose Route Frequency Provider Last Rate Last Admin   0.9 %  sodium chloride infusion  500 mL Intravenous Once  Armbruster, Carlota Raspberry, MD       Review of Systems  Constitutional:  Negative for chills, fever, malaise/fatigue and weight loss.  HENT:  Negative for congestion, sinus pain and sore throat.   Eyes: Negative.   Respiratory:  Positive for cough, shortness of breath and wheezing. Negative for hemoptysis and sputum production.   Cardiovascular:  Negative for chest pain, palpitations, orthopnea, claudication and leg swelling.  Gastrointestinal:  Positive for heartburn. Negative for abdominal pain, nausea and vomiting.  Genitourinary: Negative.   Musculoskeletal:  Negative for joint pain and myalgias.  Skin:  Negative for rash.  Neurological:  Negative for weakness.  Endo/Heme/Allergies:  Positive for environmental allergies.  Psychiatric/Behavioral: Negative.      Objective:   Vitals:   12/10/21 1404  BP: 124/80  Pulse: 77  SpO2: 95%  Weight: 248 lb 9.6 oz (112.8 kg)  Height: '5\' 10"'$  (1.778 m)   Physical Exam Constitutional:      General: He is not in acute distress.    Appearance: He is obese.  HENT:     Head: Normocephalic and atraumatic.  Eyes:     Conjunctiva/sclera: Conjunctivae normal.  Cardiovascular:     Rate and Rhythm: Normal rate and regular rhythm.     Pulses: Normal pulses.     Heart sounds: Normal heart sounds. No murmur heard. Pulmonary:     Effort: Pulmonary effort is normal.     Breath sounds: Normal breath sounds.  Musculoskeletal:     Right lower leg: No edema.     Left lower leg: No edema.  Skin:    General: Skin is warm and dry.  Neurological:     General: No focal deficit present.     Mental Status: He is alert.  Psychiatric:        Mood and Affect: Mood normal.        Behavior: Behavior normal.        Thought Content: Thought content normal.        Judgment: Judgment normal.    CBC    Component Value Date/Time   WBC 7.5 07/08/2019 1604   RBC 5.10 07/08/2019 1604   HGB 14.5 07/08/2019 1604   HCT 44.6 07/08/2019 1604   PLT 328 07/08/2019  1604   MCV 87.5 07/08/2019 1604   MCH 28.4 07/08/2019 1604   MCHC 32.5 07/08/2019 1604   RDW 12.6 07/08/2019 1604   LYMPHSABS 2.2 05/26/2019 1439   MONOABS 0.8 05/26/2019 1439   EOSABS 0.2 05/26/2019 1439   BASOSABS 0.1 05/26/2019 1439   Chest imaging: CXR 04/12/21 Cardiovascular: Cardiac silhouette and pulmonary vasculature are within normal limits.  Mediastinum: Within normal limits.  Lungs/pleura: Appropriate pulmonary expansion without focal consolidative airspace disease identified. No overt pulmonary edema. No pleural effusions or discernible pneumothorax.  Upper abdomen: Visualized portions are unremarkable.  Chest wall/osseous structures: Unremarkable.  CXR 07/08/19 Heart and mediastinal contours are within normal limits. No focal opacities or effusions. No acute bony abnormality.  PFT:     No data to display          Labs:  Path:  Echo 06/06/21: LV EF 60-65%. RV systolic function is normal.   Heart Catheterization:  Assessment & Plan:   Dyspnea, unspecified type  Snoring - Plan: Home sleep test  Apnea - Plan: Home sleep test  Discussion: Tim Nelson is a 46 year old male, never smoker with GERD, hypertension, and seasonal allergies who returns o pulmonary clinic for shortness of breath.   The etiology of his dyspnea is not clear at this time. The differential includes obstructive sleep apnea vs reactive airways disease/asthma triggered by reflux disease vs deconditioning with obesity.  We will check a home sleep study and pulmonary function tests.   We will try him on spiriva 1.14mg 2 puffs twice daily and monitor for any improvement in his dyspnea/wheezing.  Follow up in 2 months.   JFreda Jackson MD   Pulmonary & Critical Care Office: [phone]   Current Outpatient Medications:    ARIPiprazole (ABILIFY) 5 MG tablet, TAKE ONE (1) TABLET EACH DAY, Disp: 30 tablet, Rfl: 0   atorvastatin (LIPITOR) 10 MG tablet, Take 10 mg by mouth every  evening. , Disp: , Rfl:    cetirizine (ZYRTEC) 10 MG tablet, Take 10 mg by mouth daily., Disp: , Rfl:    famotidine (PEPCID) 20 MG tablet, Take 1 tablet (20 mg total) by mouth 2 (two) times daily., Disp: 60 tablet, Rfl: 0   fenofibrate micronized (LOFIBRA) 134 MG capsule, Take 134 mg by mouth daily before breakfast., Disp: , Rfl:    hydrochlorothiazide (HYDRODIURIL) 25 MG tablet, Take 25 mg by mouth 2 (two) times daily., Disp: , Rfl:    lamoTRIgine (LAMICTAL) 25 MG tablet, TAKE 1 TABLET DAILY FOR 1 WEEK THEN TAKE 2 TABLETS DAILY, Disp: 60 tablet, Rfl: 0   lisinopril (ZESTRIL) 5 MG tablet, Take 5 mg by mouth daily., Disp: , Rfl:    metFORMIN (GLUCOPHAGE) 500 MG tablet, Take 500 mg by mouth daily with breakfast., Disp: , Rfl:    metoCLOPramide (REGLAN) 5 MG tablet, Take 1 tablet (5 mg total) by mouth 3 (three) times daily before meals. Please call to schedule a follow up office visit with Dr. Havery Moros at your earliest convenience:  [phone]. Thank you., Disp: 90 tablet, Rfl: 1   montelukast (SINGULAIR) 10 MG tablet, Take 10 mg by mouth at bedtime., Disp: , Rfl:    omeprazole (PRILOSEC) 40 MG capsule, TAKE 1 CAPSULE EVERY MORNING AND TAKE 1 CAPSULE AT BEDTIME, Disp: 180 capsule, Rfl: 1   ondansetron (ZOFRAN) 4 MG tablet, Take 1 tablet (4 mg total) by mouth every 6 (six) hours as needed for nausea or vomiting., Disp: 20 tablet, Rfl: 0   QUEtiapine Fumarate (SEROQUEL PO), Take 75 mg by mouth at bedtime., Disp: , Rfl:    Tiotropium Bromide Monohydrate (SPIRIVA RESPIMAT) 2.5 MCG/ACT AERS, Inhale 2 puffs into the lungs daily., Disp: 4 g, Rfl: 0   sucralfate (CARAFATE) 1 GM/10ML suspension, Take 10 mLs (1 g total) by mouth 4 (four) times daily -  with meals and at bedtime., Disp: 1200 mL, Rfl: 2  Current Facility-Administered Medications:    0.9 %  sodium chloride infusion, 500 mL, Intravenous, Once, Armbruster, Carlota Raspberry, MD

## 2021-12-17 ENCOUNTER — Encounter: Payer: Self-pay | Admitting: Gastroenterology

## 2021-12-17 ENCOUNTER — Ambulatory Visit (INDEPENDENT_AMBULATORY_CARE_PROVIDER_SITE_OTHER): Payer: Medicare Other | Admitting: Gastroenterology

## 2021-12-17 VITALS — BP 106/80 | HR 84 | Ht 70.0 in | Wt 249.1 lb

## 2021-12-17 DIAGNOSIS — K219 Gastro-esophageal reflux disease without esophagitis: Secondary | ICD-10-CM | POA: Diagnosis not present

## 2021-12-17 DIAGNOSIS — R053 Chronic cough: Secondary | ICD-10-CM

## 2021-12-17 DIAGNOSIS — R110 Nausea: Secondary | ICD-10-CM

## 2021-12-17 NOTE — Progress Notes (Signed)
12/17/2021 Tim Nelson [id number] [birth year]   HISTORY OF PRESENT ILLNESS: This is a 46 year old male who is a patient of Dr. Doyne Keel.  He has Lynch syndrome so follows here regularly for endoscopic evaluation.  He is autistic and so is here today with his sister and her husband who are his caregivers. They are here today with ongoing complaints of chronic coughing spells.  He does have significant reflux as proven by a [facility] pH study that was performed in 2015 off of PPI therapy.  Esophageal manometry at that time was normal.  He is on omeprazole 40 mg twice daily and we just added Pepcid 20 mg at bedtime to his regimen.  His sister tells me that he will have episodes of coughing so hard that he has passed out on a few occasions.  She says that his face turns completely red.  They said that he saw a lung doctor and had a chest x-ray performed that did not show anything.  He is having a home sleep study done sometime in the near future.  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  Gastric biopsies were negative for H. pylori, just showed some chronic gastritis.  It seems that he does eat a lot of acid reflux triggering foods, sneaks food, and eats somewhat unhealthily at times.  He was prescribed some Reglan that he took earlier this year and he thinks it may have helped some, but cannot recall exactly and has been off of it for a little while.  He reports frequent episodes of nausea, but no recent vomiting.   Past Medical History:  Diagnosis Date   Allergy    Anxiety and depression    Autism    Dysthymic disorder    Enlarged liver    Esophageal stricture    Esophagitis, unspecified    GERD (gastroesophageal reflux disease)    History of chronic cough    dry cough-sleeps with elevated head of bed.   Hx of colonic polyps    Hyperlipemia    Hypertension    Seasonal allergies    Past Surgical History:  Procedure Laterality Date   BRAVO [place]  STUDY N/A 03/28/2013   Procedure: BRAVO [place] STUDY;  Surgeon: Inda Castle, MD;  Location: [facility];  Service: Endoscopy;  Laterality: N/A;   COLONSCOPY  2014   hx. polyps with past testing   DENTAL SURGERY     ESOPHAGEAL MANOMETRY N/A 04/25/2013   Procedure: ESOPHAGEAL MANOMETRY (EM);  Surgeon: Inda Castle, MD;  Location: [facility];  Service: Endoscopy;  Laterality: N/A;   ESOPHAGOGASTRODUODENOSCOPY N/A 03/28/2013   Procedure: ESOPHAGOGASTRODUODENOSCOPY (EGD);  Surgeon: Inda Castle, MD;  Location: [facility];  Service: Endoscopy;  Laterality: N/A;   HERNIA REPAIR  AS INFANT   groin    reports that he has never smoked. He has never been exposed to tobacco smoke. He has never used smokeless tobacco. He reports that he does not drink alcohol and does not use drugs. family history includes Brain cancer in his maternal aunt; Colon cancer in his mother; Colon cancer (age of onset: 80) in his brother; Colon cancer (age of onset: 31) in his maternal grandfather; Colon polyps in his mother; Congestive Heart Failure in his father; Dementia in his father; Diabetes in his mother; Esophageal cancer in his maternal grandfather; Irritable bowel syndrome in his mother; Kidney cancer in his mother; Liver cancer in his brother and maternal aunt;  Ovarian cancer in his maternal aunt; Stomach cancer in his maternal aunt. Allergies  Allergen Reactions   Cefaclor Shortness Of Breath and Swelling   Dimetapp Dm Cold-Cough [Phenylephrine-Bromphen-Dm] Shortness Of Breath and Swelling   Furosemide Rash      Outpatient Encounter Medications as of 12/17/2021  Medication Sig   ARIPiprazole (ABILIFY) 5 MG tablet TAKE ONE (1) TABLET EACH DAY   atorvastatin (LIPITOR) 10 MG tablet Take 10 mg by mouth every evening.    cetirizine (ZYRTEC) 10 MG tablet Take 10 mg by mouth daily.   famotidine (PEPCID) 20 MG tablet Take 1 tablet (20 mg total) by mouth 2 (two) times daily.   fenofibrate micronized (LOFIBRA) 134  MG capsule Take 134 mg by mouth daily before breakfast.   hydrochlorothiazide (HYDRODIURIL) 25 MG tablet Take 25 mg by mouth 2 (two) times daily.   lamoTRIgine (LAMICTAL) 25 MG tablet TAKE 1 TABLET DAILY FOR 1 WEEK THEN TAKE 2 TABLETS DAILY   lisinopril (ZESTRIL) 5 MG tablet Take 5 mg by mouth daily.   metFORMIN (GLUCOPHAGE) 500 MG tablet Take 500 mg by mouth daily with breakfast.   metoCLOPramide (REGLAN) 5 MG tablet Take 1 tablet (5 mg total) by mouth 3 (three) times daily before meals. Please call to schedule a follow up office visit with Dr. Havery Moros at your earliest convenience:  [phone]. Thank you.   montelukast (SINGULAIR) 10 MG tablet Take 10 mg by mouth at bedtime.   omeprazole (PRILOSEC) 40 MG capsule TAKE 1 CAPSULE EVERY MORNING AND TAKE 1 CAPSULE AT BEDTIME   ondansetron (ZOFRAN) 4 MG tablet Take 1 tablet (4 mg total) by mouth every 6 (six) hours as needed for nausea or vomiting.   QUEtiapine Fumarate (SEROQUEL PO) Take 75 mg by mouth at bedtime.   Tiotropium Bromide Monohydrate (SPIRIVA RESPIMAT) 2.5 MCG/ACT AERS Inhale 2 puffs into the lungs daily.   sucralfate (CARAFATE) 1 GM/10ML suspension Take 10 mLs (1 g total) by mouth 4 (four) times daily -  with meals and at bedtime.   Facility-Administered Encounter Medications as of 12/17/2021  Medication   0.9 %  sodium chloride infusion    REVIEW OF SYSTEMS  : All other systems reviewed and negative except where noted in the History of Present Illness.   PHYSICAL EXAM: BP 106/80   Pulse 84   Ht '5\' 10"'$  (1.778 m)   Wt 249 lb 2 oz (113 kg)   BMI 35.75 kg/m  General: Well developed white male in no acute distress  ASSESSMENT AND PLAN: *46 year old male with complaints of chronic cough.  He has significant reflux.  Is on omeprazole 40 mg twice daily and recently added Pepcid 20 mg at bedtime as well.  Took a trial of Reglan earlier this year and he thinks it may have helped some, but he has been off of it for a while now.   We will plan to check gastric emptying scan to see if that is a huge component playing into his symptoms.  If so we can try placing him back on Reglan again.  May also want to consider doing a 24-hour pH while on acid reflux medication to see if he is having uncontrolled episodes of reflux despite the medication.   CC:  Joya Gaskins, *

## 2021-12-17 NOTE — Patient Instructions (Signed)
If you are age 44 or older, your body mass index should be between 23-30. Your Body mass index is 35.75 kg/m. If this is out of the aforementioned range listed, please consider follow up with your Primary Care Provider.  If you are age 75 or younger, your body mass index should be between 19-25. Your Body mass index is 35.75 kg/m. If this is out of the aformentioned range listed, please consider follow up with your Primary Care Provider.   You will be contacted by [facility] in the next 2 days to arrange a Gastric emptying scan.  The number on your caller ID will be [phone], please answer when they call.  If you have not heard from them in 2 days please call [phone] to schedule.     You have been scheduled for a gastric emptying scan at [facility] on  at . Please arrive at least 30 minutes prior to your appointment for registration. Please make certain not to have anything to eat or drink after midnight the night before your test. Hold all stomach medications (ex: Zofran, phenergan, Reglan) 24 hours prior to your test. If you need to reschedule your appointment, please contact radiology scheduling at [phone].  A gastric-emptying study measures how long it takes for food to move through your stomach. There are several ways to measure stomach emptying. In the most common test, you eat food that contains a small amount of radioactive material. A scanner that detects the movement of the radioactive material is placed over your abdomen to monitor the rate at which food leaves your stomach. This test normally takes about 4 hours to complete.  It was a pleasure to see you today!  Thank you for trusting me with your gastrointestinal care!       ________________________________________________________  The [facility] providers would like to encourage you to use [facility] to communicate with providers for non-urgent requests or questions.  Due to long hold times  on the telephone, sending your provider a message by [facility] may be a faster and more efficient way to get a response.  Please allow 48 business hours for a response.  Please remember that this is for non-urgent requests.  _______________________________________________________

## 2021-12-18 NOTE — Progress Notes (Signed)
Agree with assessment and plan as outlined. If his typical reflux symptoms are controlled on this regimen then reflux causing cough would be less likely, however sounds like he does have some reflux symptoms that bother him. Agree with plan as outlined. Consider change in PPI as well, could switch to protonix or Dexilant if reflux is not well controlled, otherwise await GES

## 2022-01-06 ENCOUNTER — Ambulatory Visit (HOSPITAL_COMMUNITY): Admission: RE | Admit: 2022-01-06 | Payer: Medicare Other | Source: Ambulatory Visit

## 2022-01-17 DIAGNOSIS — R768 Other specified abnormal immunological findings in serum: Secondary | ICD-10-CM | POA: Insufficient documentation

## 2022-01-20 ENCOUNTER — Ambulatory Visit (HOSPITAL_COMMUNITY)
Admission: RE | Admit: 2022-01-20 | Discharge: 2022-01-20 | Disposition: A | Discharge status: Home / Self Care | Payer: Medicare Other | Source: Ambulatory Visit | Attending: Gastroenterology | Admitting: Gastroenterology

## 2022-01-20 DIAGNOSIS — K219 Gastro-esophageal reflux disease without esophagitis: Secondary | ICD-10-CM | POA: Diagnosis present

## 2022-01-20 DIAGNOSIS — R053 Chronic cough: Secondary | ICD-10-CM | POA: Diagnosis present

## 2022-01-20 DIAGNOSIS — R110 Nausea: Secondary | ICD-10-CM | POA: Insufficient documentation

## 2022-01-20 MED ORDER — TECHNETIUM TC 99M SULFUR COLLOID
2.0000 | Freq: Once | INTRAVENOUS | Status: AC | PRN
  Administered 2022-01-20: 2 via ORAL

## 2022-01-22 ENCOUNTER — Other Ambulatory Visit: Payer: Self-pay

## 2022-01-22 MED ORDER — DEXLANSOPRAZOLE 60 MG PO CPDR: 60.0000 mg | DELAYED_RELEASE_CAPSULE | Freq: Every day | ORAL | 3 refills | Status: DC

## 2022-02-19 ENCOUNTER — Ambulatory Visit: Payer: Medicare Other | Admitting: Pulmonary Disease

## 2022-02-21 ENCOUNTER — Ambulatory Visit: Payer: Medicare Other

## 2022-02-21 DIAGNOSIS — R0683 Snoring: Secondary | ICD-10-CM

## 2022-02-21 DIAGNOSIS — G4733 Obstructive sleep apnea (adult) (pediatric): Secondary | ICD-10-CM | POA: Diagnosis not present

## 2022-02-21 DIAGNOSIS — R0681 Apnea, not elsewhere classified: Secondary | ICD-10-CM

## 2022-03-03 ENCOUNTER — Ambulatory Visit: Payer: Medicare Other | Admitting: Pulmonary Disease

## 2022-03-05 DIAGNOSIS — G4733 Obstructive sleep apnea (adult) (pediatric): Secondary | ICD-10-CM | POA: Diagnosis not present

## 2022-03-25 ENCOUNTER — Ambulatory Visit (INDEPENDENT_AMBULATORY_CARE_PROVIDER_SITE_OTHER): Payer: Medicare Other | Admitting: Pulmonary Disease

## 2022-03-25 ENCOUNTER — Encounter: Payer: Self-pay | Admitting: Pulmonary Disease

## 2022-03-25 ENCOUNTER — Ambulatory Visit: Payer: Medicare Other | Admitting: Pulmonary Disease

## 2022-03-25 VITALS — BP 128/70 | HR 83 | Ht 69.0 in | Wt 249.0 lb

## 2022-03-25 DIAGNOSIS — G4733 Obstructive sleep apnea (adult) (pediatric): Secondary | ICD-10-CM

## 2022-03-25 DIAGNOSIS — R0600 Dyspnea, unspecified: Secondary | ICD-10-CM

## 2022-03-25 NOTE — Progress Notes (Signed)
Synopsis: Referred in April 2023 for shortness of breath  Subjective:   PATIENT ID: Tim Nelson GENDER: male DOB: Jun 07, 1975, MRN: [id number]  HPI  Chief Complaint  Patient presents with   Follow-up    F/U after PFT. States he has been having issues with SOB and snoring at night. Recently completed a HST.    Tim Nelson is a 47 year old male, never smoker with GERD, hypertension, and seasonal allergies who returns to pulmonary clinic for shortness of breath.   PFTs are within normal limits. He did not feel improvement with inhaler therapy in the past. His breathing has been stable since last visit.  We discussed home sleep study results shoed an AHI of 62.7/hr with SpO2 nadir of 77%. He is amenable to starting CPAP therapy.   OV 12/10/21 He tried advair but it was stopped because he was getting jittery after 1-2 days of use. He does not recall if his breathing was improved from the medication or not. He does feel his breathing is a bit better since last visit as he is not wheezing as much. He is trying to work on weight loss. His mother recently passed away and he has not been able to do the sleep study yet. Echo 05/2021 is unremarkable. He reports his GERD is improved.   Initial OV 05/20/21 He is accompanied by his sister and brother in law. He has history of autism and developmental delay. His mother was his primary care taker until she was recently placed on hospice so his sister is now helping with his care.   He has noticed increasing exertional dyspnea with intermittent wheezing. He also notices the wheezing when he lays down. He has significant weight gain over recent years. There is also concern for witnessed apneas in his sleep and snoring.   He report increased abdominal swelling and scrotal swelling along with hemorrhoids. There is concern for fatty liver disease and he is being evaluated at [facility] by Dr. Beverley Fiedler of hepatology. He is followed by [facility]  for history of GERD and history concerning for Lynch Syndrome.   He reports no child hood issues with his breathing. He is a never smoker. He does have second hand smoke exposure in childhood. He works occasionally for a [employer]ilities [employer] and was not wearing protective masks/respirators.   Past Medical History:  Diagnosis Date   Allergy    Anxiety and depression    Autism    Dysthymic disorder    Enlarged liver    Esophageal stricture    Esophagitis, unspecified    GERD (gastroesophageal reflux disease)    History of chronic cough    dry cough-sleeps with elevated head of bed.   Hx of colonic polyps    Hyperlipemia    Hypertension    Seasonal allergies      Family History  Problem Relation Age of Onset   Colon cancer Mother    Irritable bowel syndrome Mother    Colon polyps Mother    Diabetes Mother    Kidney cancer Mother    Congestive Heart Failure Father    Dementia Father    Colon cancer Brother 57   Liver cancer Brother    Ovarian cancer Maternal Aunt    Liver cancer Maternal Aunt    Stomach cancer Maternal Aunt    Brain cancer Maternal Aunt    Colon cancer Maternal Grandfather 1   Esophageal cancer Maternal Grandfather  Social History   Socioeconomic History   Marital status: Single    Spouse name: Not on file   Number of children: Not on file   Years of education: Not on file   Highest education level: Not on file  Occupational History   Occupation: Unemployed   Tobacco Use   Smoking status: Never    Passive exposure: Never   Smokeless tobacco: Never  Vaping Use   Vaping Use: Never used  Substance and Sexual Activity   Alcohol use: No   Drug use: No   Sexual activity: Not on file  Other Topics Concern   Not on file  Social History Narrative   Not on file   Social Determinants of Health   Financial Resource Strain: Not on file  Food Insecurity: Not on file  Transportation Needs: Not on file  Physical  Activity: Not on file  Stress: Not on file  Social Connections: Not on file  Intimate Partner Violence: Not on file     Allergies  Allergen Reactions   Cefaclor Shortness Of Breath and Swelling   Dimetapp Dm Cold-Cough [Phenylephrine-Bromphen-Dm] Shortness Of Breath and Swelling   Furosemide Rash     Outpatient Medications Prior to Visit  Medication Sig Dispense Refill   ARIPiprazole (ABILIFY) 5 MG tablet TAKE ONE (1) TABLET EACH DAY 30 tablet 0   atorvastatin (LIPITOR) 10 MG tablet Take 10 mg by mouth every evening.      cetirizine (ZYRTEC) 10 MG tablet Take 10 mg by mouth daily.     dexlansoprazole (DEXILANT) 60 MG capsule Take 1 capsule (60 mg total) by mouth daily. 90 capsule 3   fenofibrate micronized (LOFIBRA) 134 MG capsule Take 134 mg by mouth daily before breakfast.     hydrochlorothiazide (HYDRODIURIL) 25 MG tablet Take 25 mg by mouth 2 (two) times daily.     lamoTRIgine (LAMICTAL) 25 MG tablet TAKE 1 TABLET DAILY FOR 1 WEEK THEN TAKE 2 TABLETS DAILY 60 tablet 0   lisinopril (ZESTRIL) 5 MG tablet Take 5 mg by mouth daily.     metFORMIN (GLUCOPHAGE) 500 MG tablet Take 500 mg by mouth daily with breakfast.     metoCLOPramide (REGLAN) 5 MG tablet Take 1 tablet (5 mg total) by mouth 3 (three) times daily before meals. Please call to schedule a follow up office visit with Dr. Havery Moros at your earliest convenience:  [phone]. Thank you. 90 tablet 1   montelukast (SINGULAIR) 10 MG tablet Take 10 mg by mouth at bedtime.     ondansetron (ZOFRAN) 4 MG tablet Take 1 tablet (4 mg total) by mouth every 6 (six) hours as needed for nausea or vomiting. 20 tablet 0   sertraline (ZOLOFT) 50 MG tablet Take 50 mg by mouth daily.     sucralfate (CARAFATE) 1 GM/10ML suspension Take 10 mLs (1 g total) by mouth 4 (four) times daily -  with meals and at bedtime. 1200 mL 2   famotidine (PEPCID) 20 MG tablet Take 1 tablet (20 mg total) by mouth 2 (two) times daily. 60 tablet 0   QUEtiapine  Fumarate (SEROQUEL PO) Take 75 mg by mouth at bedtime.     Tiotropium Bromide Monohydrate (SPIRIVA RESPIMAT) 2.5 MCG/ACT AERS Inhale 2 puffs into the lungs daily. 4 g 0   Facility-Administered Medications Prior to Visit  Medication Dose Route Frequency Provider Last Rate Last Admin   0.9 %  sodium chloride infusion  500 mL Intravenous Once Armbruster, Carlota Raspberry, MD  Review of Systems  Constitutional:  Negative for chills, fever, malaise/fatigue and weight loss.  HENT:  Negative for congestion, sinus pain and sore throat.   Eyes: Negative.   Respiratory:  Positive for shortness of breath. Negative for cough, hemoptysis, sputum production and wheezing.   Cardiovascular:  Negative for chest pain, palpitations, orthopnea, claudication and leg swelling.  Gastrointestinal:  Negative for abdominal pain, heartburn, nausea and vomiting.  Genitourinary: Negative.   Musculoskeletal:  Negative for joint pain and myalgias.  Skin:  Negative for rash.  Neurological:  Negative for weakness.  Endo/Heme/Allergies:  Negative for environmental allergies.  Psychiatric/Behavioral: Negative.      Objective:   Vitals:   03/25/22 1503  BP: 128/70  Pulse: 83  SpO2: 96%  Weight: 249 lb (112.9 kg)  Height: 5' 9"$  (1.753 m)   Physical Exam Constitutional:      General: He is not in acute distress.    Appearance: He is obese.  HENT:     Head: Normocephalic and atraumatic.  Eyes:     Conjunctiva/sclera: Conjunctivae normal.  Cardiovascular:     Rate and Rhythm: Normal rate and regular rhythm.     Pulses: Normal pulses.     Heart sounds: Normal heart sounds. No murmur heard. Pulmonary:     Effort: Pulmonary effort is normal.     Breath sounds: Normal breath sounds.  Musculoskeletal:     Right lower leg: No edema.     Left lower leg: No edema.  Skin:    General: Skin is warm and dry.  Neurological:     General: No focal deficit present.     Mental Status: He is alert.    CBC    Component  Value Date/Time   WBC 7.5 07/08/2019 1604   RBC 5.10 07/08/2019 1604   HGB 14.5 07/08/2019 1604   HCT 44.6 07/08/2019 1604   PLT 328 07/08/2019 1604   MCV 87.5 07/08/2019 1604   MCH 28.4 07/08/2019 1604   MCHC 32.5 07/08/2019 1604   RDW 12.6 07/08/2019 1604   LYMPHSABS 2.2 05/26/2019 1439   MONOABS 0.8 05/26/2019 1439   EOSABS 0.2 05/26/2019 1439   BASOSABS 0.1 05/26/2019 1439   Chest imaging: CXR 04/12/21 Cardiovascular: Cardiac silhouette and pulmonary vasculature are within normal limits.  Mediastinum: Within normal limits.  Lungs/pleura: Appropriate pulmonary expansion without focal consolidative airspace disease identified. No overt pulmonary edema. No pleural effusions or discernible pneumothorax.  Upper abdomen: Visualized portions are unremarkable.  Chest wall/osseous structures: Unremarkable.  CXR 07/08/19 Heart and mediastinal contours are within normal limits. No focal opacities or effusions. No acute bony abnormality.  PFT:     No data to display          Labs:  Path:  Echo 06/06/21: LV EF 60-65%. RV systolic function is normal.   Heart Catheterization:  Assessment & Plan:   OSA (obstructive sleep apnea) - Plan: Ambulatory Referral for DME, Desensitization mask fit  Discussion: Tim Nelson is a 47 year old male, never smoker with GERD, hypertension, and seasonal allergies who returns to pulmonary clinic for shortness of breath.   PFTs are normal and he has not benefitted from inhaler trials in the past.  The differential includes obstructive sleep apnea and deconditioning with obesity.  He has severe sleep apnea based on recent home sleep study. He is to start CPAP 5-15 cmH2O auto-titrating and have a mask fitting session.   He is to call our office for follow up 1 month after initiating  therapy to review download and make adjustments if needed. He will need an ONO on CPAP on room air once he is tolerating therapy consistently to determine if he needs  supplemental oxygen at night.   Freda Jackson, MD [facility] Office: [phone]   Current Outpatient Medications:    ARIPiprazole (ABILIFY) 5 MG tablet, TAKE ONE (1) TABLET EACH DAY, Disp: 30 tablet, Rfl: 0   atorvastatin (LIPITOR) 10 MG tablet, Take 10 mg by mouth every evening. , Disp: , Rfl:    cetirizine (ZYRTEC) 10 MG tablet, Take 10 mg by mouth daily., Disp: , Rfl:    dexlansoprazole (DEXILANT) 60 MG capsule, Take 1 capsule (60 mg total) by mouth daily., Disp: 90 capsule, Rfl: 3   fenofibrate micronized (LOFIBRA) 134 MG capsule, Take 134 mg by mouth daily before breakfast., Disp: , Rfl:    hydrochlorothiazide (HYDRODIURIL) 25 MG tablet, Take 25 mg by mouth 2 (two) times daily., Disp: , Rfl:    lamoTRIgine (LAMICTAL) 25 MG tablet, TAKE 1 TABLET DAILY FOR 1 WEEK THEN TAKE 2 TABLETS DAILY, Disp: 60 tablet, Rfl: 0   lisinopril (ZESTRIL) 5 MG tablet, Take 5 mg by mouth daily., Disp: , Rfl:    metFORMIN (GLUCOPHAGE) 500 MG tablet, Take 500 mg by mouth daily with breakfast., Disp: , Rfl:    metoCLOPramide (REGLAN) 5 MG tablet, Take 1 tablet (5 mg total) by mouth 3 (three) times daily before meals. Please call to schedule a follow up office visit with Dr. Havery Moros at your earliest convenience:  [phone]. Thank you., Disp: 90 tablet, Rfl: 1   montelukast (SINGULAIR) 10 MG tablet, Take 10 mg by mouth at bedtime., Disp: , Rfl:    ondansetron (ZOFRAN) 4 MG tablet, Take 1 tablet (4 mg total) by mouth every 6 (six) hours as needed for nausea or vomiting., Disp: 20 tablet, Rfl: 0   sertraline (ZOLOFT) 50 MG tablet, Take 50 mg by mouth daily., Disp: , Rfl:    sucralfate (CARAFATE) 1 GM/10ML suspension, Take 10 mLs (1 g total) by mouth 4 (four) times daily -  with meals and at bedtime., Disp: 1200 mL, Rfl: 2  Current Facility-Administered Medications:    0.9 %  sodium chloride infusion, 500 mL, Intravenous, Once, Armbruster, Carlota Raspberry, MD

## 2022-03-25 NOTE — Plan of Care (Signed)
Full PFT performed today. °

## 2022-03-25 NOTE — Patient Instructions (Addendum)
Your Breathing tests are within normal limits  No need to try other inhalers at this time  Your sleep study showed severe obstructive sleep apnea.  We will order you a CPAP machine with humidification (use distilled water), settings 5-15cmH2O autotitrating. We will also order you a mask fitting session to find the best mask for you to start using.   Please call for a follow up visit 1 month after you start your CPAP machine.

## 2022-03-28 ENCOUNTER — Telehealth: Payer: Self-pay | Admitting: Pulmonary Disease

## 2022-03-28 NOTE — Telephone Encounter (Signed)
Received a fax from [facility] for patient's cpap machine. They are not able to process the order to the patient having traditional Medicare. They have requested that we send the order to another DME company.   [facility], please send his CPAP machine order to another DME. Thanks!

## 2022-03-31 LAB — PULMONARY FUNCTION TEST
DL/VA % pred: 109 %
DL/VA: 4.99 ml/min/mmHg/L
DLCO cor % pred: 106 %
DLCO cor: 31.48 ml/min/mmHg
DLCO unc % pred: 106 %
DLCO unc: 31.48 ml/min/mmHg
FEF 25-75 Post: 4.33 L/sec
FEF 25-75 Pre: 4.37 L/sec
FEF2575-%Change-Post: 0 %
FEF2575-%Pred-Post: 117 %
FEF2575-%Pred-Pre: 118 %
FEV1-%Change-Post: 0 %
FEV1-%Pred-Post: 102 %
FEV1-%Pred-Pre: 103 %
FEV1-Post: 4.07 L
FEV1-Pre: 4.11 L
FEV1FVC-%Change-Post: 1 %
FEV1FVC-%Pred-Pre: 103 %
FEV6-%Change-Post: -2 %
FEV6-%Pred-Post: 99 %
FEV6-%Pred-Pre: 101 %
FEV6-Post: 4.88 L
FEV6-Pre: 4.99 L
FEV6FVC-%Change-Post: 0 %
FEV6FVC-%Pred-Post: 102 %
FEV6FVC-%Pred-Pre: 102 %
FVC-%Change-Post: -2 %
FVC-%Pred-Post: 96 %
FVC-%Pred-Pre: 99 %
FVC-Post: 4.88 L
Post FEV1/FVC ratio: 83 %
Post FEV6/FVC ratio: 100 %
Pre FEV1/FVC ratio: 82 %
Pre FEV6/FVC Ratio: 100 %
RV % pred: 107 %
RV: 1.99 L
TLC % pred: 101 %
TLC: 6.88 L

## 2022-03-31 NOTE — Telephone Encounter (Signed)
I have sent order to [facility].  Nothing further needed.

## 2022-04-16 ENCOUNTER — Other Ambulatory Visit (HOSPITAL_BASED_OUTPATIENT_CLINIC_OR_DEPARTMENT_OTHER): Payer: Medicare Other

## 2022-04-21 ENCOUNTER — Telehealth: Payer: Self-pay | Admitting: Pulmonary Disease

## 2022-04-21 NOTE — Telephone Encounter (Signed)
Pt sister called in requesting office visit notes that states Pt has OSA. Printed off last visits office notes and left up front in file cabinet, Pt sister states she will pick it up tomorrow. Pt sister still wants someone to reach out to her in regards of getting something else more detailed from Dr. Erin Fulling.

## 2022-04-22 NOTE — Telephone Encounter (Signed)
Last office note printed. Will take downstairs. Nothing further needed

## 2022-05-08 ENCOUNTER — Ambulatory Visit: Payer: Medicare Other | Admitting: Pulmonary Disease

## 2022-05-08 NOTE — Progress Notes (Deleted)
Synopsis: Referred in April 2023 for shortness of breath  Subjective:   PATIENT ID: Tim Nelson GENDER: male DOB: March 01, 1975, MRN: [id number]  HPI  No chief complaint on file.  Tim Nelson is a 47 year old male, never smoker with GERD, hypertension, and seasonal allergies who returns to pulmonary clinic for obstructive sleep apnea.   He was started on CPAP therapy after last visit.   OV 03/25/22 PFTs are within normal limits. He did not feel improvement with inhaler therapy in the past. His breathing has been stable since last visit.  We discussed home sleep study results shoed an AHI of 62.7/hr with SpO2 nadir of 77%. He is amenable to starting CPAP therapy.  OV 12/10/21 He tried advair but it was stopped because he was getting jittery after 1-2 days of use. He does not recall if his breathing was improved from the medication or not. He does feel his breathing is a bit better since last visit as he is not wheezing as much. He is trying to work on weight loss. His mother recently passed away and he has not been able to do the sleep study yet. Echo 05/2021 is unremarkable. He reports his GERD is improved.   Initial OV 05/20/21 He is accompanied by his sister and brother in law. He has history of autism and developmental delay. His mother was his primary care taker until she was recently placed on hospice so his sister is now helping with his care.   He has noticed increasing exertional dyspnea with intermittent wheezing. He also notices the wheezing when he lays down. He has significant weight gain over recent years. There is also concern for witnessed apneas in his sleep and snoring.   He report increased abdominal swelling and scrotal swelling along with hemorrhoids. There is concern for fatty liver disease and he is being evaluated at [facility] by Dr. Beverley Fiedler of hepatology. He is followed by [facility] for history of GERD and history concerning for Lynch Syndrome.   He  reports no child hood issues with his breathing. He is a never smoker. He does have second hand smoke exposure in childhood. He works occasionally for a Facilities [employer] and was not wearing protective masks/respirators.   Past Medical History:  Diagnosis Date   Allergy    Anxiety and depression    Autism    Dysthymic disorder    Enlarged liver    Esophageal stricture    Esophagitis, unspecified    GERD (gastroesophageal reflux disease)    History of chronic cough    dry cough-sleeps with elevated head of bed.   Hx of colonic polyps    Hyperlipemia    Hypertension    Seasonal allergies      Family History  Problem Relation Age of Onset   Colon cancer Mother    Irritable bowel syndrome Mother    Colon polyps Mother    Diabetes Mother    Kidney cancer Mother    Congestive Heart Failure Father    Dementia Father    Colon cancer Brother 63   Liver cancer Brother    Ovarian cancer Maternal Aunt    Liver cancer Maternal Aunt    Stomach cancer Maternal Aunt    Brain cancer Maternal Aunt    Colon cancer Maternal Grandfather 22   Esophageal cancer Maternal Grandfather      Social History   Socioeconomic History   Marital status: Single  Spouse name: Not on file   Number of children: Not on file   Years of education: Not on file   Highest education level: Not on file  Occupational History   Occupation: Unemployed   Tobacco Use   Smoking status: Never    Passive exposure: Never   Smokeless tobacco: Never  Vaping Use   Vaping Use: Never used  Substance and Sexual Activity   Alcohol use: No   Drug use: No   Sexual activity: Not on file  Other Topics Concern   Not on file  Social History Narrative   Not on file   Social Determinants of Health   Financial Resource Strain: Not on file  Food Insecurity: Not on file  Transportation Needs: Not on file  Physical Activity: Not on file  Stress: Not on file  Social Connections: Not on  file  Intimate Partner Violence: Not on file     Allergies  Allergen Reactions   Cefaclor Shortness Of Breath and Swelling   Dimetapp Dm Cold-Cough [Phenylephrine-Bromphen-Dm] Shortness Of Breath and Swelling   Furosemide Rash     Outpatient Medications Prior to Visit  Medication Sig Dispense Refill   ARIPiprazole (ABILIFY) 5 MG tablet TAKE ONE (1) TABLET EACH DAY 30 tablet 0   atorvastatin (LIPITOR) 10 MG tablet Take 10 mg by mouth every evening.      cetirizine (ZYRTEC) 10 MG tablet Take 10 mg by mouth daily.     dexlansoprazole (DEXILANT) 60 MG capsule Take 1 capsule (60 mg total) by mouth daily. 90 capsule 3   fenofibrate micronized (LOFIBRA) 134 MG capsule Take 134 mg by mouth daily before breakfast.     hydrochlorothiazide (HYDRODIURIL) 25 MG tablet Take 25 mg by mouth 2 (two) times daily.     lamoTRIgine (LAMICTAL) 25 MG tablet TAKE 1 TABLET DAILY FOR 1 WEEK THEN TAKE 2 TABLETS DAILY 60 tablet 0   lisinopril (ZESTRIL) 5 MG tablet Take 5 mg by mouth daily.     metFORMIN (GLUCOPHAGE) 500 MG tablet Take 500 mg by mouth daily with breakfast.     metoCLOPramide (REGLAN) 5 MG tablet Take 1 tablet (5 mg total) by mouth 3 (three) times daily before meals. Please call to schedule a follow up office visit with Dr. Havery Moros at your earliest convenience:  [phone]. Thank you. 90 tablet 1   montelukast (SINGULAIR) 10 MG tablet Take 10 mg by mouth at bedtime.     ondansetron (ZOFRAN) 4 MG tablet Take 1 tablet (4 mg total) by mouth every 6 (six) hours as needed for nausea or vomiting. 20 tablet 0   sertraline (ZOLOFT) 50 MG tablet Take 50 mg by mouth daily.     sucralfate (CARAFATE) 1 GM/10ML suspension Take 10 mLs (1 g total) by mouth 4 (four) times daily -  with meals and at bedtime. 1200 mL 2   Facility-Administered Medications Prior to Visit  Medication Dose Route Frequency Provider Last Rate Last Admin   0.9 %  sodium chloride infusion  500 mL Intravenous Once Armbruster, Carlota Raspberry,  MD       Review of Systems  Constitutional:  Negative for chills, fever, malaise/fatigue and weight loss.  HENT:  Negative for congestion, sinus pain and sore throat.   Eyes: Negative.   Respiratory:  Positive for shortness of breath. Negative for cough, hemoptysis, sputum production and wheezing.   Cardiovascular:  Negative for chest pain, palpitations, orthopnea, claudication and leg swelling.  Gastrointestinal:  Negative for abdominal pain, heartburn, nausea  and vomiting.  Genitourinary: Negative.   Musculoskeletal:  Negative for joint pain and myalgias.  Skin:  Negative for rash.  Neurological:  Negative for weakness.  Endo/Heme/Allergies:  Negative for environmental allergies.  Psychiatric/Behavioral: Negative.      Objective:   There were no vitals filed for this visit.  Physical Exam Constitutional:      General: He is not in acute distress.    Appearance: He is obese.  HENT:     Head: Normocephalic and atraumatic.  Eyes:     Conjunctiva/sclera: Conjunctivae normal.  Cardiovascular:     Rate and Rhythm: Normal rate and regular rhythm.     Pulses: Normal pulses.     Heart sounds: Normal heart sounds. No murmur heard. Pulmonary:     Effort: Pulmonary effort is normal.     Breath sounds: Normal breath sounds.  Musculoskeletal:     Right lower leg: No edema.     Left lower leg: No edema.  Skin:    General: Skin is warm and dry.  Neurological:     General: No focal deficit present.     Mental Status: He is alert.    CBC    Component Value Date/Time   WBC 7.5 07/08/2019 1604   RBC 5.10 07/08/2019 1604   HGB 14.5 07/08/2019 1604   HCT 44.6 07/08/2019 1604   PLT 328 07/08/2019 1604   MCV 87.5 07/08/2019 1604   MCH 28.4 07/08/2019 1604   MCHC 32.5 07/08/2019 1604   RDW 12.6 07/08/2019 1604   LYMPHSABS 2.2 05/26/2019 1439   MONOABS 0.8 05/26/2019 1439   EOSABS 0.2 05/26/2019 1439   BASOSABS 0.1 05/26/2019 1439   Chest imaging: CXR 04/12/21 Cardiovascular:  Cardiac silhouette and pulmonary vasculature are within normal limits.  Mediastinum: Within normal limits.  Lungs/pleura: Appropriate pulmonary expansion without focal consolidative airspace disease identified. No overt pulmonary edema. No pleural effusions or discernible pneumothorax.  Upper abdomen: Visualized portions are unremarkable.  Chest wall/osseous structures: Unremarkable.  CXR 07/08/19 Heart and mediastinal contours are within normal limits. No focal opacities or effusions. No acute bony abnormality.  PFT:    Latest Ref Rng & Units 03/25/2022    5:05 PM  PFT Results  FVC-Predicted Pre % 99   FVC-Post L 4.88   FVC-Predicted Post % 96   Pre FEV1/FVC % % 82   Post FEV1/FCV % % 83   FEV1-Pre L 4.11   FEV1-Predicted Pre % 103   FEV1-Post L 4.07   DLCO uncorrected ml/min/mmHg 31.48   DLCO UNC% % 106   DLCO corrected ml/min/mmHg 31.48   DLCO COR %Predicted % 106   DLVA Predicted % 109   TLC L 6.88   TLC % Predicted % 101   RV % Predicted % 107     Labs:  Path:  Echo 06/06/21: LV EF 60-65%. RV systolic function is normal.   Heart Catheterization:  Assessment & Plan:   No diagnosis found.  Discussion: Tim Nelson is a 47 year old male, never smoker with GERD, hypertension, and seasonal allergies who returns to pulmonary clinic for shortness of breath.   PFTs are normal and he has not benefitted from inhaler trials in the past.  The differential includes obstructive sleep apnea and deconditioning with obesity.  He has severe sleep apnea based on recent home sleep study. He is to start CPAP 5-15 cmH2O auto-titrating and have a mask fitting session.   He is to call our office for follow up 1 month  after initiating therapy to review download and make adjustments if needed. He will need an ONO on CPAP on room air once he is tolerating therapy consistently to determine if he needs supplemental oxygen at night.   Freda Jackson, MD [facility] Office: [phone]   Current Outpatient Medications:    ARIPiprazole (ABILIFY) 5 MG tablet, TAKE ONE (1) TABLET EACH DAY, Disp: 30 tablet, Rfl: 0   atorvastatin (LIPITOR) 10 MG tablet, Take 10 mg by mouth every evening. , Disp: , Rfl:    cetirizine (ZYRTEC) 10 MG tablet, Take 10 mg by mouth daily., Disp: , Rfl:    dexlansoprazole (DEXILANT) 60 MG capsule, Take 1 capsule (60 mg total) by mouth daily., Disp: 90 capsule, Rfl: 3   fenofibrate micronized (LOFIBRA) 134 MG capsule, Take 134 mg by mouth daily before breakfast., Disp: , Rfl:    hydrochlorothiazide (HYDRODIURIL) 25 MG tablet, Take 25 mg by mouth 2 (two) times daily., Disp: , Rfl:    lamoTRIgine (LAMICTAL) 25 MG tablet, TAKE 1 TABLET DAILY FOR 1 WEEK THEN TAKE 2 TABLETS DAILY, Disp: 60 tablet, Rfl: 0   lisinopril (ZESTRIL) 5 MG tablet, Take 5 mg by mouth daily., Disp: , Rfl:    metFORMIN (GLUCOPHAGE) 500 MG tablet, Take 500 mg by mouth daily with breakfast., Disp: , Rfl:    metoCLOPramide (REGLAN) 5 MG tablet, Take 1 tablet (5 mg total) by mouth 3 (three) times daily before meals. Please call to schedule a follow up office visit with Dr. Havery Moros at your earliest convenience:  [phone]. Thank you., Disp: 90 tablet, Rfl: 1   montelukast (SINGULAIR) 10 MG tablet, Take 10 mg by mouth at bedtime., Disp: , Rfl:    ondansetron (ZOFRAN) 4 MG tablet, Take 1 tablet (4 mg total) by mouth every 6 (six) hours as needed for nausea or vomiting., Disp: 20 tablet, Rfl: 0   sertraline (ZOLOFT) 50 MG tablet, Take 50 mg by mouth daily., Disp: , Rfl:    sucralfate (CARAFATE) 1 GM/10ML suspension, Take 10 mLs (1 g total) by mouth 4 (four) times daily -  with meals and at bedtime., Disp: 1200 mL, Rfl: 2  Current Facility-Administered Medications:    0.9 %  sodium chloride infusion, 500 mL, Intravenous, Once, Armbruster, Carlota Raspberry, MD

## 2022-05-15 ENCOUNTER — Ambulatory Visit: Payer: Medicare Other | Admitting: Pulmonary Disease

## 2022-06-03 ENCOUNTER — Ambulatory Visit: Payer: Medicare Other | Admitting: Pulmonary Disease

## 2022-06-09 ENCOUNTER — Ambulatory Visit (INDEPENDENT_AMBULATORY_CARE_PROVIDER_SITE_OTHER): Payer: Medicare Other | Admitting: Pulmonary Disease

## 2022-06-09 ENCOUNTER — Encounter: Payer: Self-pay | Admitting: Pulmonary Disease

## 2022-06-09 ENCOUNTER — Telehealth: Payer: Self-pay | Admitting: Pulmonary Disease

## 2022-06-09 VITALS — BP 118/84 | HR 58 | Ht 69.0 in | Wt 239.0 lb

## 2022-06-09 DIAGNOSIS — G4733 Obstructive sleep apnea (adult) (pediatric): Secondary | ICD-10-CM

## 2022-06-09 NOTE — Progress Notes (Signed)
Synopsis: Referred in April 2023 for shortness of breath  Subjective:   PATIENT ID: Tim Nelson GENDER: male DOB: 02/24/1975, MRN: [id number]  HPI  Chief Complaint  Patient presents with   Follow-up    F/U for CPAP. States the machine has working well but the mask is causing him to have a sore spot on his nose.    Izsak Hartunian is a 47 year old male, never smoker with GERD, hypertension, and seasonal allergies who returns to pulmonary clinic for shortness of breath and obstructive sleep apnea.   CPAP download shows perfect compliance with AHI 2 or less. He has sore over the bridge of his nose and received foam covering for his mask but continued to have issues with sore. He reports his day time dyspnea is much improved. He has lost 10lbs since last visit due to diet.  OV 03/25/22 PFTs are within normal limits. He did not feel improvement with inhaler therapy in the past. His breathing has been stable since last visit.  We discussed home sleep study results shoed an AHI of 62.7/hr with SpO2 nadir of 77%. He is amenable to starting CPAP therapy.   OV 12/10/21 He tried advair but it was stopped because he was getting jittery after 1-2 days of use. He does not recall if his breathing was improved from the medication or not. He does feel his breathing is a bit better since last visit as he is not wheezing as much. He is trying to work on weight loss. His mother recently passed away and he has not been able to do the sleep study yet. Echo 05/2021 is unremarkable. He reports his GERD is improved.   Initial OV 05/20/21 He is accompanied by his sister and brother in law. He has history of autism and developmental delay. His mother was his primary care taker until she was recently placed on hospice so his sister is now helping with his care.   He has noticed increasing exertional dyspnea with intermittent wheezing. He also notices the wheezing when he lays down. He has significant weight gain over  recent years. There is also concern for witnessed apneas in his sleep and snoring.   He report increased abdominal swelling and scrotal swelling along with hemorrhoids. There is concern for fatty liver disease and he is being evaluated at [facility] by Dr. Elsie Stain of hepatology. He is followed by [facility] for history of GERD and history concerning for Lynch Syndrome.   He reports no child hood issues with his breathing. He is a never smoker. He does have second hand smoke exposure in childhood. 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   Past Medical History:  Diagnosis Date   Allergy    Anxiety and depression    Autism    Dysthymic disorder    Enlarged liver    Esophageal stricture    Esophagitis, unspecified    GERD (gastroesophageal reflux disease)    History of chronic cough    dry cough-sleeps with elevated head of bed.   Hx of colonic polyps    Hyperlipemia    Hypertension    Seasonal allergies      Family History  Problem Relation Age of Onset   Colon cancer Mother    Irritable bowel syndrome Mother    Colon polyps Mother    Diabetes Mother    Kidney cancer Mother    Congestive  Heart Failure Father    Dementia Father    Colon cancer Brother 20   Liver cancer Brother    Ovarian cancer Maternal Aunt    Liver cancer Maternal Aunt    Stomach cancer Maternal Aunt    Brain cancer Maternal Aunt    Colon cancer Maternal Grandfather 63   Esophageal cancer Maternal Grandfather      Social History   Socioeconomic History   Marital status: Single    Spouse name: Not on file   Number of children: Not on file   Years of education: Not on file   Highest education level: Not on file  Occupational History   Occupation: Unemployed   Tobacco Use   Smoking status: Never    Passive exposure: Never   Smokeless tobacco: Never  Vaping Use   Vaping Use: Never used  Substance and  Sexual Activity   Alcohol use: No   Drug use: No   Sexual activity: Not on file  Other Topics Concern   Not on file  Social History Narrative   Not on file   Social Determinants of Health   Financial Resource Strain: Not on file  Food Insecurity: Not on file  Transportation Needs: Not on file  Physical Activity: Not on file  Stress: Not on file  Social Connections: Not on file  Intimate Partner Violence: Not on file     Allergies  Allergen Reactions   Cefaclor Shortness Of Breath and Swelling   Dimetapp Dm Cold-Cough [Phenylephrine-Bromphen-Dm] Shortness Of Breath and Swelling   Furosemide Rash     Outpatient Medications Prior to Visit  Medication Sig Dispense Refill   ARIPiprazole (ABILIFY) 5 MG tablet TAKE ONE (1) TABLET EACH DAY 30 tablet 0   atorvastatin (LIPITOR) 10 MG tablet Take 10 mg by mouth every evening.      cetirizine (ZYRTEC) 10 MG tablet Take 10 mg by mouth daily.     dexlansoprazole (DEXILANT) 60 MG capsule Take 1 capsule (60 mg total) by mouth daily. 90 capsule 3   fenofibrate micronized (LOFIBRA) 134 MG capsule Take 134 mg by mouth daily before breakfast.     hydrochlorothiazide (HYDRODIURIL) 25 MG tablet Take 25 mg by mouth 2 (two) times daily.     lamoTRIgine (LAMICTAL) 25 MG tablet TAKE 1 TABLET DAILY FOR 1 WEEK THEN TAKE 2 TABLETS DAILY 60 tablet 0   lisinopril (ZESTRIL) 5 MG tablet Take 5 mg by mouth daily.     metFORMIN (GLUCOPHAGE) 500 MG tablet Take 500 mg by mouth daily with breakfast.     metoCLOPramide (REGLAN) 5 MG tablet Take 1 tablet (5 mg total) by mouth 3 (three) times daily before meals. Please call to schedule a follow up office visit with Dr. Adela Lank at your earliest convenience:  [phone]. Thank you. 90 tablet 1   montelukast (SINGULAIR) 10 MG tablet Take 10 mg by mouth at bedtime.     ondansetron (ZOFRAN) 4 MG tablet Take 1 tablet (4 mg total) by mouth every 6 (six) hours as needed for nausea or vomiting. 20 tablet 0   sertraline  (ZOLOFT) 50 MG tablet Take 50 mg by mouth daily.     sucralfate (CARAFATE) 1 GM/10ML suspension Take 10 mLs (1 g total) by mouth 4 (four) times daily -  with meals and at bedtime. 1200 mL 2   Facility-Administered Medications Prior to Visit  Medication Dose Route Frequency Provider Last Rate Last Admin   0.9 %  sodium chloride infusion  500 mL  Intravenous Once Armbruster, [name], MD       Review of Systems  Constitutional:  Negative for chills, fever, malaise/fatigue and weight loss.  HENT:  Negative for congestion, sinus pain and sore throat.   Eyes: Negative.   Respiratory:  Negative for cough, hemoptysis, sputum production, shortness of breath and wheezing.   Cardiovascular:  Negative for chest pain, palpitations, orthopnea, claudication and leg swelling.  Gastrointestinal:  Negative for abdominal pain, heartburn, nausea and vomiting.  Genitourinary: Negative.   Musculoskeletal:  Negative for joint pain and myalgias.  Skin:  Negative for rash.  Neurological:  Negative for weakness.  Endo/Heme/Allergies:  Negative for environmental allergies.  Psychiatric/Behavioral: Negative.      Objective:   Vitals:   06/09/22 1308  BP: 118/84  Pulse: (!) 58  SpO2: 97%  Weight: 239 lb (108.4 kg)  Height: 5\' 9"  (1.753 m)   Physical Exam Constitutional:      General: He is not in acute distress.    Appearance: He is obese.  HENT:     Head: Normocephalic and atraumatic.  Eyes:     Conjunctiva/sclera: Conjunctivae normal.  Cardiovascular:     Rate and Rhythm: Normal rate and regular rhythm.     Pulses: Normal pulses.     Heart sounds: Normal heart sounds. No murmur heard. Pulmonary:     Effort: Pulmonary effort is normal.     Breath sounds: Normal breath sounds.  Musculoskeletal:     Right lower leg: No edema.     Left lower leg: No edema.  Skin:    General: Skin is warm and dry.  Neurological:     General: No focal deficit present.     Mental Status: He is alert.    CBC     Component Value Date/Time   WBC 7.5 07/08/2019 1604   RBC 5.10 07/08/2019 1604   HGB 14.5 07/08/2019 1604   HCT 44.6 07/08/2019 1604   PLT 328 07/08/2019 1604   MCV 87.5 07/08/2019 1604   MCH 28.4 07/08/2019 1604   MCHC 32.5 07/08/2019 1604   RDW 12.6 07/08/2019 1604   LYMPHSABS 2.2 05/26/2019 1439   MONOABS 0.8 05/26/2019 1439   EOSABS 0.2 05/26/2019 1439   BASOSABS 0.1 05/26/2019 1439   Chest imaging: CXR 04/12/21 Cardiovascular: Cardiac silhouette and pulmonary vasculature are within normal limits.  Mediastinum: Within normal limits.  Lungs/pleura: Appropriate pulmonary expansion without focal consolidative airspace disease identified. No overt pulmonary edema. No pleural effusions or discernible pneumothorax.  Upper abdomen: Visualized portions are unremarkable.  Chest wall/osseous structures: Unremarkable.  CXR 07/08/19 Heart and mediastinal contours are within normal limits. No focal opacities or effusions. No acute bony abnormality.  PFT:    Latest Ref Rng & Units 03/25/2022    5:05 PM  PFT Results  FVC-Predicted Pre % 99   FVC-Post L 4.88   FVC-Predicted Post % 96   Pre FEV1/FVC % % 82   Post FEV1/FCV % % 83   FEV1-Pre L 4.11   FEV1-Predicted Pre % 103   FEV1-Post L 4.07   DLCO uncorrected ml/min/mmHg 31.48   DLCO UNC% % 106   DLCO corrected ml/min/mmHg 31.48   DLCO COR %Predicted % 106   DLVA Predicted % 109   TLC L 6.88   TLC % Predicted % 101   RV % Predicted % 107     Labs:  Path:  Echo 06/06/21: LV EF 60-65%. RV systolic function is normal.   Heart Catheterization:  Assessment &  Plan:   OSA (obstructive sleep apnea)  Discussion: Nasif Len is a 47 year old male, never smoker with GERD, hypertension, and seasonal allergies who returns to pulmonary clinic for shortness of breath and OSA.  His dyspnea is better since starting CPAP therapy. He shows good compliance at this time. We will order him a nasal pillow mask to use for 1-2 weeks while  the sore on the bridge of his nose heals. He can then return to his current mask and use mole skin on his nose to prevent sores or pressure injuries.   He is to continue CPAP 5-15 cmH2O auto-titrating.  He will need an ONO on CPAP on room air once he is tolerating therapy consistently to determine if he needs supplemental oxygen at night.   Melody Comas, MD [facility] Office: [phone]   Current Outpatient Medications:    ARIPiprazole (ABILIFY) 5 MG tablet, TAKE ONE (1) TABLET EACH DAY, Disp: 30 tablet, Rfl: 0   atorvastatin (LIPITOR) 10 MG tablet, Take 10 mg by mouth every evening. , Disp: , Rfl:    cetirizine (ZYRTEC) 10 MG tablet, Take 10 mg by mouth daily., Disp: , Rfl:    dexlansoprazole (DEXILANT) 60 MG capsule, Take 1 capsule (60 mg total) by mouth daily., Disp: 90 capsule, Rfl: 3   fenofibrate micronized (LOFIBRA) 134 MG capsule, Take 134 mg by mouth daily before breakfast., Disp: , Rfl:    hydrochlorothiazide (HYDRODIURIL) 25 MG tablet, Take 25 mg by mouth 2 (two) times daily., Disp: , Rfl:    lamoTRIgine (LAMICTAL) 25 MG tablet, TAKE 1 TABLET DAILY FOR 1 WEEK THEN TAKE 2 TABLETS DAILY, Disp: 60 tablet, Rfl: 0   lisinopril (ZESTRIL) 5 MG tablet, Take 5 mg by mouth daily., Disp: , Rfl:    metFORMIN (GLUCOPHAGE) 500 MG tablet, Take 500 mg by mouth daily with breakfast., Disp: , Rfl:    metoCLOPramide (REGLAN) 5 MG tablet, Take 1 tablet (5 mg total) by mouth 3 (three) times daily before meals. Please call to schedule a follow up office visit with Dr. Adela Lank at your earliest convenience:  [phone]. Thank you., Disp: 90 tablet, Rfl: 1   montelukast (SINGULAIR) 10 MG tablet, Take 10 mg by mouth at bedtime., Disp: , Rfl:    ondansetron (ZOFRAN) 4 MG tablet, Take 1 tablet (4 mg total) by mouth every 6 (six) hours as needed for nausea or vomiting., Disp: 20 tablet, Rfl: 0   sertraline (ZOLOFT) 50 MG tablet, Take 50 mg by mouth daily., Disp: , Rfl:     sucralfate (CARAFATE) 1 GM/10ML suspension, Take 10 mLs (1 g total) by mouth 4 (four) times daily -  with meals and at bedtime., Disp: 1200 mL, Rfl: 2  Current Facility-Administered Medications:    0.9 %  sodium chloride infusion, 500 mL, Intravenous, Once, Armbruster, [name], MD

## 2022-06-09 NOTE — Patient Instructions (Addendum)
Home sleep study results showed an AHI of 62.7/hr with SpO2 nadir of 77%.  Your Current AHI on CPAP is less than 5/hr. You have great compliance at this time. Keep up the good work.  Continue to work on weight loss  Use moleskin to cover the bridge of your nose   We will send an order to the DME company to fit you for a nasal pillow mask to use for 1-2 weeks at the sore on the bridge of your nose heals  Follow up in 6 months

## 2022-08-05 ENCOUNTER — Encounter: Payer: Self-pay | Admitting: Gastroenterology

## 2022-08-11 ENCOUNTER — Other Ambulatory Visit: Payer: Self-pay | Admitting: Nurse Practitioner

## 2022-08-11 DIAGNOSIS — K760 Fatty (change of) liver, not elsewhere classified: Secondary | ICD-10-CM

## 2022-08-11 DIAGNOSIS — K7401 Hepatic fibrosis, early fibrosis: Secondary | ICD-10-CM

## 2022-08-19 ENCOUNTER — Encounter: Payer: Self-pay | Admitting: Gastroenterology

## 2022-08-19 DIAGNOSIS — G4733 Obstructive sleep apnea (adult) (pediatric): Secondary | ICD-10-CM | POA: Insufficient documentation

## 2022-08-28 ENCOUNTER — Ambulatory Visit
Admission: RE | Admit: 2022-08-28 | Discharge: 2022-08-28 | Disposition: A | Discharge status: Home / Self Care | Payer: Medicare Other | Source: Ambulatory Visit | Attending: Nurse Practitioner | Admitting: Nurse Practitioner

## 2022-08-28 DIAGNOSIS — K760 Fatty (change of) liver, not elsewhere classified: Secondary | ICD-10-CM

## 2022-08-28 DIAGNOSIS — K7401 Hepatic fibrosis, early fibrosis: Secondary | ICD-10-CM

## 2022-10-16 ENCOUNTER — Ambulatory Visit (AMBULATORY_SURGERY_CENTER): Payer: Medicare Other

## 2022-10-16 VITALS — Ht 71.0 in | Wt 239.0 lb

## 2022-10-16 DIAGNOSIS — Z1509 Genetic susceptibility to other malignant neoplasm: Secondary | ICD-10-CM

## 2022-10-16 DIAGNOSIS — Z800 Family history of malignant neoplasm of digestive organs: Secondary | ICD-10-CM

## 2022-10-16 MED ORDER — NA SULFATE-K SULFATE-MG SULF 17.5-3.13-1.6 GM/177ML PO SOLN
1.0000 | Freq: Once | ORAL | 0 refills | Status: AC
Start: 2022-10-16 — End: 2022-10-16

## 2022-10-16 NOTE — Progress Notes (Signed)

## 2022-10-26 ENCOUNTER — Other Ambulatory Visit: Payer: Self-pay

## 2022-10-26 ENCOUNTER — Emergency Department (HOSPITAL_BASED_OUTPATIENT_CLINIC_OR_DEPARTMENT_OTHER)
Admission: EM | Admit: 2022-10-26 | Discharge: 2022-10-26 | Disposition: A | Discharge status: Home / Self Care | Payer: Medicare Other | Attending: Emergency Medicine | Admitting: Emergency Medicine

## 2022-10-26 ENCOUNTER — Encounter (HOSPITAL_BASED_OUTPATIENT_CLINIC_OR_DEPARTMENT_OTHER): Payer: Self-pay

## 2022-10-26 ENCOUNTER — Emergency Department (HOSPITAL_BASED_OUTPATIENT_CLINIC_OR_DEPARTMENT_OTHER): Payer: Medicare Other

## 2022-10-26 DIAGNOSIS — M79644 Pain in right finger(s): Secondary | ICD-10-CM | POA: Diagnosis not present

## 2022-10-26 DIAGNOSIS — T148XXA Other injury of unspecified body region, initial encounter: Secondary | ICD-10-CM

## 2022-10-26 DIAGNOSIS — S62632B Displaced fracture of distal phalanx of right middle finger, initial encounter for open fracture: Secondary | ICD-10-CM | POA: Diagnosis not present

## 2022-10-26 DIAGNOSIS — W228XXA Striking against or struck by other objects, initial encounter: Secondary | ICD-10-CM | POA: Insufficient documentation

## 2022-10-26 DIAGNOSIS — I10 Essential (primary) hypertension: Secondary | ICD-10-CM | POA: Diagnosis not present

## 2022-10-26 DIAGNOSIS — S62639B Displaced fracture of distal phalanx of unspecified finger, initial encounter for open fracture: Secondary | ICD-10-CM

## 2022-10-26 DIAGNOSIS — Z23 Encounter for immunization: Secondary | ICD-10-CM | POA: Diagnosis not present

## 2022-10-26 DIAGNOSIS — Z7984 Long term (current) use of oral hypoglycemic drugs: Secondary | ICD-10-CM | POA: Insufficient documentation

## 2022-10-26 DIAGNOSIS — Z79899 Other long term (current) drug therapy: Secondary | ICD-10-CM | POA: Diagnosis not present

## 2022-10-26 DIAGNOSIS — S6991XA Unspecified injury of right wrist, hand and finger(s), initial encounter: Secondary | ICD-10-CM | POA: Diagnosis present

## 2022-10-26 MED ORDER — CLINDAMYCIN HCL 150 MG PO CAPS: 300.0000 mg | ORAL_CAPSULE | Freq: Once | ORAL | Status: DC

## 2022-10-26 MED ORDER — TETANUS-DIPHTH-ACELL PERTUSSIS 5-2.5-18.5 LF-MCG/0.5 IM SUSY
0.5000 mL | PREFILLED_SYRINGE | Freq: Once | INTRAMUSCULAR | Status: AC
  Administered 2022-10-26: 0.5 mL via INTRAMUSCULAR
  Filled 2022-10-26: qty 0.5

## 2022-10-26 MED ORDER — CLINDAMYCIN HCL 150 MG PO CAPS
150.0000 mg | ORAL_CAPSULE | Freq: Once | ORAL | Status: AC
  Administered 2022-10-26: 150 mg via ORAL
  Filled 2022-10-26: qty 1

## 2022-10-26 MED ORDER — CLINDAMYCIN HCL 150 MG PO CAPS: 150.0000 mg | ORAL_CAPSULE | Freq: Four times a day (QID) | ORAL | 0 refills | Status: DC

## 2022-10-26 MED ORDER — BACITRACIN ZINC 500 UNIT/GM EX OINT: TOPICAL_OINTMENT | Freq: Two times a day (BID) | CUTANEOUS | Status: DC

## 2022-10-26 NOTE — Discharge Instructions (Signed)
Please pick up the antibiotics I have prescribed for you for your open fracture.  Please call the phone number for Dr. Shon Baton to see Dr. Orlan Leavens or other hand specialist in the group.  Please take Tylenol every 6 hours as needed for pain.  If symptoms change or worsen please return to ER.

## 2022-10-26 NOTE — ED Triage Notes (Addendum)
Pt presents with a crush injury to the distal tip of the R third and fourth digit that happened yesterday. Pt also states he need his tetanus vaccine updated.

## 2022-10-26 NOTE — ED Notes (Signed)
Discharge paperwork given and verbally understood. 

## 2022-10-26 NOTE — ED Provider Notes (Cosign Needed Addendum)
[facility] AT [facility] Provider Note   CSN: [id number] Arrival date & time: 10/26/22  1304     History  Chief Complaint  Patient presents with   Finger Injury    Tim Nelson is a 47 y.o. male history of GERD, hypertension presented with crush injury to his right middle finger.  This occurred yesterday afternoon with a truck door that slammed on patient's finger.  Patient is unsure of last tetanus.  Patient states he has a cut on the end of his finger and they can still feels move his fingers but wants to be evaluated and x-ray.  Patient states he cannot see his bone.  Patient denies blood thinners.    Home Medications Prior to Admission medications   Medication Sig Start Date End Date Taking? Authorizing Provider  clindamycin (CLEOCIN) 150 MG capsule Take 1 capsule (150 mg total) by mouth every 6 (six) hours. 10/26/22  Yes Levone Otten, Beverly Gust, PA-C  acetaminophen (TYLENOL) 500 MG tablet Take by mouth.    [provider]  ARIPiprazole (ABILIFY) 5 MG tablet TAKE ONE (1) TABLET EACH DAY 02/21/20   Thresa Ross, MD  ARIPiprazole (ABILIFY) 5 MG tablet Take by mouth. 02/21/20   [provider]  atorvastatin (LIPITOR) 10 MG tablet Take 10 mg by mouth every evening.     [provider]  cetirizine (ZYRTEC) 10 MG tablet Take 10 mg by mouth daily.    [provider]  dexlansoprazole (DEXILANT) 60 MG capsule Take 1 capsule (60 mg total) by mouth daily. 01/22/22   Zehr, Princella Pellegrini, PA-C  fenofibrate micronized (LOFIBRA) 134 MG capsule Take 134 mg by mouth daily before breakfast.    [provider]  hydrochlorothiazide (HYDRODIURIL) 25 MG tablet Take 25 mg by mouth 2 (two) times daily.    [provider]  lamoTRIgine (LAMICTAL) 25 MG tablet TAKE 1 TABLET DAILY FOR 1 WEEK THEN TAKE 2 TABLETS DAILY 09/05/19   Thresa Ross, MD  lisinopril (ZESTRIL) 5 MG tablet Take 5 mg by mouth daily.    [provider]   metFORMIN (GLUCOPHAGE) 500 MG tablet Take 500 mg by mouth daily with breakfast.    [provider]  metoCLOPramide (REGLAN) 5 MG tablet Take 1 tablet (5 mg total) by mouth 3 (three) times daily before meals. Please call to schedule a follow up office visit with Dr. Adela Lank at your earliest convenience:  [phone]. Thank you. 08/21/21   Armbruster, [name], MD  montelukast (SINGULAIR) 10 MG tablet Take 10 mg by mouth at bedtime.    [provider]  ondansetron (ZOFRAN) 4 MG tablet Take 1 tablet (4 mg total) by mouth every 6 (six) hours as needed for nausea or vomiting. Patient not taking: Reported on 10/16/2022 04/24/21   Esterwood, Amy S, PA-C  sertraline (ZOLOFT) 50 MG tablet Take 50 mg by mouth daily. 12/07/20   [provider]  sucralfate (CARAFATE) 1 GM/10ML suspension Take 10 mLs (1 g total) by mouth 4 (four) times daily -  with meals and at bedtime. 04/24/21 05/24/21  Esterwood, Amy S, PA-C      Allergies    Cefaclor, Dimetapp dm cold-cough [phenylephrine-bromphen-dm], and Furosemide    Review of Systems   Review of Systems  Physical Exam Updated Vital Signs BP (!) 149/83 (BP Location: Left Arm)   Pulse 80   Temp 98.7 F (37.1 C)   Resp 16   Ht 5\' 11"  (1.803 m)   Wt 108 kg  SpO2 97%   BMI 33.19 kg/m  Physical Exam Constitutional:      General: He is not in acute distress. Cardiovascular:     Rate and Rhythm: Normal rate.     Pulses: Normal pulses.  Musculoskeletal:     Comments: Right middle finger: Step-off noted the distal aspect with tenderness to palpation, soft compartments, pain not out of proportion, 5/5 grip strength, no bony protrusions, no flexor tendon tenderness, no dactylitis, no finger held in flexion  Skin:    General: Skin is warm and dry.     Capillary Refill: Capillary refill takes less than 2 seconds.     Findings: Bruising present.     Comments: Laceration noted to the distal aspect of patient's right middle finger that  is superficial in nature and I cannot find bone  Neurological:     Mental Status: He is alert.     Comments: Sensation intact distally        ED Results / Procedures / Treatments   Labs (all labs ordered are listed, but only abnormal results are displayed) Labs Reviewed - No data to display  EKG None  Radiology DG Hand Complete Right  Result Date: 10/26/2022 CLINICAL DATA:  Crush injury EXAM: RIGHT HAND - COMPLETE 3+ VIEW COMPARISON:  None Available. FINDINGS: Minimally displaced fracture tuft distal phalanx right long finger. No intra-articular extension. In regional soft tissue swelling. No other acute bone abnormalities. Normal alignment and mineralization. No radiodense foreign body. IMPRESSION: Minimally displaced fracture tuft distal phalanx right long finger. Electronically Signed   By: Corlis Leak M.D.   On: 10/26/2022 14:02    Procedures Procedures    Medications Ordered in ED Medications  bacitracin ointment (has no administration in time range)  clindamycin (CLEOCIN) capsule 150 mg (has no administration in time range)  Tdap (BOOSTRIX) injection 0.5 mL (0.5 mLs Intramuscular Given 10/26/22 1435)    ED Course/ Medical Decision Making/ A&P                                 Medical Decision Making Amount and/or Complexity of Data Reviewed Radiology: ordered.  Risk OTC drugs. Prescription drug management.   [name] 47 y.o. presented today for right middle finger pain. Working DDx that I considered at this time includes, but not limited to, contusion, strain/sprain, fracture, dislocation, neurovascular compromise, septic joint, ischemic limb, compartment syndrome.  R/o DDx: contusion, strain/sprain, dislocation, neurovascular compromise, septic joint, ischemic limb, compartment syndrome: These are considered less likely due to history of present illness, physical exam, labs/imaging findings.  Review of prior external notes: 10/08/2022 office visit  Unique  Tests and My Interpretation:  Right hand x-ray: Tuft fracture at the distal aspect of the right middle finger  Discussion with Independent Historian:  Family member  Discussion of Management of Tests:  Shon Baton, MD orthopedics  Risk: Medium: prescription drug management  Risk Stratification Score: none  Plan: On exam patient was in no acute distress stable vitals.  Patient was neuro vastly intact and had good capillary refill.  Patient good pulses as well 2+ radial on the right side.  Patient does have a laceration to the distal end of his finger with ecchymosis noted however the laceration does not appear deep and I cannot find bone.  X-ray shows tuft fracture.  At this time suspect open fracture and will treat with oral antibiotics and have the wound irrigated and have bacitracin  ointment placed over it and anticipate hand follow-up.  Wound occurred yesterday and so at this time patient is outside the window to have the laceration repaired.  Patient does have anaphylactic allergy to cephalosporins and so may give clindamycin.  I spoke to the patient extensively about how clindamycin can cause C. difficile and patient is aware of the side effect and states he is still okay with the medication.  I spoke to the hand specialist and he agrees with the plan listed above.  Will give the clindamycin and prescribed the rest and have the patient follow-up with a hand specialist within the [facility] group.  Recommended patient takes Tylenol every 6 hours as needed for pain.  Patient was given return precautions. Patient stable for discharge at this time.  Patient verbalized understanding of plan.  Final Clinical Impression(s) / ED Diagnoses Final diagnoses:  Open fracture  Open fracture of tuft of distal phalanx of finger    Rx / DC Orders ED Discharge Orders          Ordered    clindamycin (CLEOCIN) 150 MG capsule  Every 6 hours        10/26/22 1533             Remi Deter 10/26/22 1540    Alvira Monday, MD 10/28/22 1319

## 2022-10-27 ENCOUNTER — Telehealth: Payer: Self-pay | Admitting: Gastroenterology

## 2022-10-28 ENCOUNTER — Encounter: Payer: Medicare Other | Admitting: Gastroenterology

## 2022-10-28 NOTE — Telephone Encounter (Signed)
Recall entered to remind him to reschedule.

## 2022-12-11 ENCOUNTER — Ambulatory Visit: Payer: Medicare Other

## 2022-12-11 ENCOUNTER — Telehealth: Payer: Self-pay

## 2022-12-11 VITALS — Ht 69.0 in | Wt 240.0 lb

## 2022-12-11 DIAGNOSIS — Z800 Family history of malignant neoplasm of digestive organs: Secondary | ICD-10-CM

## 2022-12-11 DIAGNOSIS — Z1509 Genetic susceptibility to other malignant neoplasm: Secondary | ICD-10-CM

## 2022-12-11 NOTE — Progress Notes (Signed)
Pre visit completed in person with sister (healthcare POA); Patient verified name, DOB, and address; No egg or soy allergy known to patient  No issues known to pt with past sedation with any surgeries or procedures; Patient denies ever being told they had issues or difficulty with intubation;  No FH of Malignant Hyperthermia; Pt is not on diet pills; Pt is not on home 02;  Pt is not on blood thinners; Pt denies issues with constipation;  No A fib or A flutter; Have any cardiac testing pending--NO Insurance verified during PV appt--- Medicare A/B and Medicaid Pt can ambulate without assistance;  Pt denies use of chewing tobacco; Discussed diabetic/weight loss medication holds; Discussed NSAID holds; Checked BMI to be less than 50; Pt instructed to use [employer] or GoodRx for a price reduction on prep;  Patient's chart reviewed by Cathlyn Parsons CNRA prior to previsit and patient appropriate for the [facility];  Pre visit completed and red dot placed by patient's name on their procedure day (on provider's schedule);    Instructions sent to MyChart as well as printed and given to the patient and sister during PV appt; Sister present during PV appt- sister reports they already have the prep at home- howefver, instructed sister to call or MyChart message the office if she gets home and the prep is not called SUPREP; Patient advised to call back to the office at [phone] should questions/concerns arise; Patient verbalized understanding of information/instructions;

## 2022-12-11 NOTE — Telephone Encounter (Signed)
Dr.Armbruster, This patient came into his Pre Visit appt today complaining still of acid reflux despite medication.  Patient's sister was present for PV appt; she also stated the patient has been having choking spells, even when asleep. Patient does not follow a diet to prevent consuming acid reflux from occurring per the patient's sister.   They are wanting to know if they need to be seen in the office by you or an APP for medicaiton changes or if an EGD can be scheduled at the same time as the recall colon?  Patient, along with his sister, completed the PV appt; Patient and sister were instructed that patient's procedure date may change if direct EGD is able to be scheduled along with colonoscopy-and updated instructions would be sent to the patient's MyChart account.  IF not, they will be instructed on the next step when the office contacts them.  Please advise Bre

## 2022-12-11 NOTE — Telephone Encounter (Signed)
He has had an EGD last year for this issue.  I do not think repeat EGD his next best step and he should probably be seen in the office to discuss this further before considering next steps in his care.  I would keep him on schedule for colonoscopy and book him in the office with me or APP for GERD.  Thanks

## 2022-12-12 NOTE — Telephone Encounter (Signed)
RN confirmed sister's legal status to communicate with [facility] regarding her brother's healthcare. Upon confirming that, RN returned call to sister who stated that the prep she picked up for her brother is not Suprep. When sister confirmed what they had picked up, RN confirmed with her that it is the generic version of Suprep. Sister stated that she will have her brother take the generic Suprep for his bowel prep.

## 2022-12-12 NOTE — Telephone Encounter (Signed)
Inbound call from patient's sister stating when picking up prep medication it was a different medication and instructions than what was discussed at visit. Patient's sister requesting a call back. Please advise, thank you.

## 2022-12-24 ENCOUNTER — Ambulatory Visit (AMBULATORY_SURGERY_CENTER): Payer: Medicare Other | Admitting: Gastroenterology

## 2022-12-24 ENCOUNTER — Encounter: Payer: Self-pay | Admitting: Gastroenterology

## 2022-12-24 VITALS — BP 115/77 | HR 68 | Temp 99.1°F | Resp 20 | Ht 69.0 in | Wt 240.0 lb

## 2022-12-24 DIAGNOSIS — Z1211 Encounter for screening for malignant neoplasm of colon: Secondary | ICD-10-CM | POA: Diagnosis not present

## 2022-12-24 DIAGNOSIS — K602 Anal fissure, unspecified: Secondary | ICD-10-CM

## 2022-12-24 DIAGNOSIS — Z800 Family history of malignant neoplasm of digestive organs: Secondary | ICD-10-CM | POA: Diagnosis not present

## 2022-12-24 DIAGNOSIS — D125 Benign neoplasm of sigmoid colon: Secondary | ICD-10-CM

## 2022-12-24 DIAGNOSIS — Z1509 Genetic susceptibility to other malignant neoplasm: Secondary | ICD-10-CM

## 2022-12-24 MED ORDER — AMBULATORY NON FORMULARY MEDICATION: 0 refills | Status: DC

## 2022-12-24 MED ORDER — SODIUM CHLORIDE 0.9 % IV SOLN: 500.0000 mL | Freq: Once | INTRAVENOUS | Status: DC

## 2022-12-24 NOTE — Progress Notes (Signed)
[facility] History and Physical   Primary Care Physician:  Trisha Mangle, FNP   Reason for Procedure:   Lynch syndrome, family history of colon cancer  Plan:    colonoscopy     HPI: Tim Nelson is a 47 y.o. male  here for colonoscopy surveillance - history of Lynch syndrome and family history of colon cancer.   Patient denies any bowel symptoms at this time. Otherwise feels well without any cardiopulmonary symptoms.   I have discussed risks / benefits of anesthesia and endoscopic procedure with [name] and they wish to proceed with the exams as outlined today.    Past Medical History:  Diagnosis Date   Allergy    Anxiety and depression    Autism    Diabetes mellitus without complication (HCC)    on meds   Dysthymic disorder    Enlarged liver    Esophageal stricture    Esophagitis, unspecified    Fatty liver    GERD (gastroesophageal reflux disease)    Hepatic fibrosis    History of chronic cough    dry cough-sleeps with elevated head of bed.   Hx of colonic polyps    Hyperlipemia    Hypertension    Lynch syndrome    Seasonal allergies     Past Surgical History:  Procedure Laterality Date   BRAVO PH STUDY N/A 03/28/2013   Procedure: BRAVO PH STUDY;  Surgeon: Louis Meckel, MD;  Location: [facility];  Service: Endoscopy;  Laterality: N/A;   COLONOSCOPY  2023   SA-MAC-plenvu(good)-TA-1-38yr recall   COLONSCOPY  02/04/2012   hx. polyps with past testing   DENTAL SURGERY     ESOPHAGEAL MANOMETRY N/A 04/25/2013   Procedure: ESOPHAGEAL MANOMETRY (EM);  Surgeon: Louis Meckel, MD;  Location: [facility];  Service: Endoscopy;  Laterality: N/A;   ESOPHAGOGASTRODUODENOSCOPY N/A 03/28/2013   Procedure: ESOPHAGOGASTRODUODENOSCOPY (EGD);  Surgeon: Louis Meckel, MD;  Location: [facility];  Service: Endoscopy;  Laterality: N/A;   HERNIA REPAIR  AS INFANT   groin    Prior to Admission medications   Medication Sig Start Date End  Date Taking? Authorizing Provider  acetaminophen (TYLENOL) 500 MG tablet Take by mouth.   Yes [provider]  ARIPiprazole (ABILIFY) 5 MG tablet TAKE ONE (1) TABLET EACH DAY 02/21/20  Yes Thresa Ross, MD  atorvastatin (LIPITOR) 10 MG tablet Take 10 mg by mouth every evening.    Yes [provider]  cetirizine (ZYRTEC) 10 MG tablet Take 10 mg by mouth daily.   Yes [provider]  dexlansoprazole (DEXILANT) 60 MG capsule Take 1 capsule (60 mg total) by mouth daily. 01/22/22  Yes Zehr, Princella Pellegrini, PA-C  fenofibrate micronized (LOFIBRA) 134 MG capsule Take 134 mg by mouth daily before breakfast.   Yes [provider]  hydrochlorothiazide (HYDRODIURIL) 25 MG tablet Take 25 mg by mouth daily.   Yes [provider]  lisinopril (ZESTRIL) 5 MG tablet Take 5 mg by mouth daily.   Yes [provider]  metFORMIN (GLUCOPHAGE) 500 MG tablet Take 500 mg by mouth daily with breakfast.   Yes [provider]  montelukast (SINGULAIR) 10 MG tablet Take 10 mg by mouth at bedtime.   Yes [provider]  sertraline (ZOLOFT) 50 MG tablet Take 50 mg by mouth daily. 12/07/20  Yes [provider]  lamoTRIgine (LAMICTAL) 25 MG tablet TAKE 1 TABLET DAILY FOR 1 WEEK THEN TAKE 2 TABLETS DAILY Patient taking differently: Take  50 mg by mouth daily. 09/05/19   Thresa Ross, MD    Current Outpatient Medications  Medication Sig Dispense Refill   acetaminophen (TYLENOL) 500 MG tablet Take by mouth.     ARIPiprazole (ABILIFY) 5 MG tablet TAKE ONE (1) TABLET EACH DAY 30 tablet 0   atorvastatin (LIPITOR) 10 MG tablet Take 10 mg by mouth every evening.      cetirizine (ZYRTEC) 10 MG tablet Take 10 mg by mouth daily.     dexlansoprazole (DEXILANT) 60 MG capsule Take 1 capsule (60 mg total) by mouth daily. 90 capsule 3   fenofibrate micronized (LOFIBRA) 134 MG capsule Take 134 mg by mouth daily before breakfast.     hydrochlorothiazide (HYDRODIURIL) 25  MG tablet Take 25 mg by mouth daily.     lisinopril (ZESTRIL) 5 MG tablet Take 5 mg by mouth daily.     metFORMIN (GLUCOPHAGE) 500 MG tablet Take 500 mg by mouth daily with breakfast.     montelukast (SINGULAIR) 10 MG tablet Take 10 mg by mouth at bedtime.     sertraline (ZOLOFT) 50 MG tablet Take 50 mg by mouth daily.     lamoTRIgine (LAMICTAL) 25 MG tablet TAKE 1 TABLET DAILY FOR 1 WEEK THEN TAKE 2 TABLETS DAILY (Patient taking differently: Take 50 mg by mouth daily.) 60 tablet 0   Current Facility-Administered Medications  Medication Dose Route Frequency Provider Last Rate Last Admin   0.9 %  sodium chloride infusion  500 mL Intravenous Once Jalaysia Lobb, [name], MD       0.9 %  sodium chloride infusion  500 mL Intravenous Once Melenie Minniear, [name], MD        Allergies as of 12/24/2022 - Review Complete 12/24/2022  Allergen Reaction Noted   Cefaclor Shortness Of Breath and Swelling 04/26/2008   Dimetapp dm cold-cough [phenylephrine-bromphen-dm] Shortness Of Breath and Swelling 06/14/2012   Furosemide Rash 05/15/2014   Reglan [metoclopramide] Anxiety and Other (See Comments) 12/11/2022    Family History  Problem Relation Age of Onset   Colon cancer Mother 65   Irritable bowel syndrome Mother    Colon polyps Mother 29   Diabetes Mother    Kidney cancer Mother 20   Kidney disease Father    Congestive Heart Failure Father    Dementia Father    Kidney failure Father    Colon polyps Brother 28   Colon cancer Brother 61   Liver cancer Brother    Ovarian cancer Maternal Aunt    Liver cancer Maternal Aunt    Stomach cancer Maternal Aunt 72   Brain cancer Maternal Aunt    Colon polyps Maternal Grandfather 67   Colon cancer Maternal Grandfather 67   Esophageal cancer Maternal Grandfather 72   Rectal cancer Neg Hx     Social History   Socioeconomic History   Marital status: Single    Spouse name: Not on file   Number of children: Not on file   Years of education: Not on file    Highest education level: Not on file  Occupational History   Occupation: Unemployed   Tobacco Use   Smoking status: Never    Passive exposure: Never   Smokeless tobacco: Never  Vaping Use   Vaping status: Never Used  Substance and Sexual Activity   Alcohol use: No   Drug use: No   Sexual activity: Not on file  Other Topics Concern   Not on file  Social History Narrative   Not on file  Social Determinants of Health   Financial Resource Strain: Not on file  Food Insecurity: Low Risk  (10/08/2022)   Received from [facility]   Hunger Vital Sign    Worried About Running Out of Food in the Last Year: Never true    Ran Out of Food in the Last Year: Never true  Transportation Needs: No Transportation Needs (10/08/2022)   Received from [facility]    In the past 12 months, has lack of reliable transportation kept you from medical appointments, meetings, work or from getting things needed for daily living? : No  Physical Activity: Not on file  Stress: Not on file  Social Connections: Unknown (06/02/2021)   [facility]   Social Network    Social Network: Not on file  Intimate Partner Violence: Unknown (05/09/2021)   Received from [facility]   HITS    Physically Hurt: Not on file    Insult or Talk Down To: Not on file    Threaten Physical Harm: Not on file    Scream or Curse: Not on file    Review of Systems: All other review of systems negative except as mentioned in the HPI.  Physical Exam: Vital signs BP 104/62   Pulse 72   Temp 99.1 F (37.3 C) (Temporal)   Ht 5\' 9"  (1.753 m)   Wt 240 lb (108.9 kg)   SpO2 96%   BMI 35.44 kg/m   General:   Alert,  Well-developed, pleasant and cooperative in NAD Lungs:  Clear throughout to auscultation.   Heart:  Regular rate and rhythm Abdomen:  Soft, nontender and nondistended.   Neuro/Psych:  Alert and cooperative. Normal mood and affect. A and O x 3  Harlin Rain, MD [facility]

## 2022-12-24 NOTE — Progress Notes (Signed)
Called to room to assist during endoscopic procedure.  Patient ID and intended procedure confirmed with present staff. Received instructions for my participation in the procedure from the performing physician.  

## 2022-12-24 NOTE — Progress Notes (Signed)
Sedate, gd SR, tolerated procedure well, VSS, report to RN 

## 2022-12-24 NOTE — Progress Notes (Signed)
Umbilical Hernia is getting larger and complains of pain per patient's caregiver.   Also would like some information for a good cream for hemorrhoids/rash. Please talk to caregiver for details.

## 2022-12-24 NOTE — Op Note (Signed)
[facility] Patient Name: Tim Nelson Procedure Date: 12/24/2022 3:24 PM MRN: [id number] Endoscopist: Viviann Spare P. 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 The patient's tolerance of                            previous anesthesia was also reviewed. The risks                            and benefits of the procedure and the sedation                            options and risks were discussed with the patient.                            All questions were answered, and informed consent                            was obtained. Prior Anticoagulants: The patient has                            taken no anticoagulant or antiplatelet agents. ASA                            Grade Assessment: III - A patient with severe                            systemic disease. After reviewing the risks and                            benefits, the patient was deemed in satisfactory                            condition to undergo the procedure.                           After obtaining informed consent, the colonoscope  was passed under direct vision. Throughout the                            procedure, the patient's blood pressure, pulse, and                            oxygen saturations were monitored continuously. The                            CF [id number] #[id number] was introduced through the anus                            and advanced to the the cecum, identified by                             appendiceal orifice and ileocecal valve. The                            colonoscopy was performed without difficulty. The                            patient tolerated the procedure well. The quality                            of the bowel preparation was good. The ileocecal                            valve, appendiceal orifice, and rectum were                            photographed. Scope In: 3:40:17 PM Scope Out: 3:55:50 PM Scope Withdrawal Time: 0 hours 12 minutes 44 seconds  Total Procedure Duration: 0 hours 15 minutes 33 seconds  Findings:                 An anal fissure was found on perianal exam                            (posterior midline anal canal). Additionally, there                            was a benign appearing soft, lesion several cms in                            size inferior to the anus, near the scrotum -                            unchanged since the last exam.                           A diminutive polyp was found in the sigmoid colon.                            The polyp was sessile. The polyp was  removed with a                            cold snare. Resection and retrieval were complete.                           Internal hemorrhoids were found during retroflexion.                           The exam was otherwise without abnormality. Complications:            No immediate complications. Estimated blood loss:                            Minimal. Estimated Blood Loss:     Estimated blood loss was minimal. Impression:               - Anal fissure found on perianal exam.                           - Soft tissue polypoid lesion near the scrotum                            unchanged since the last exam. Had recommended                            surgical evaluation for removal at that time, that                            was not done.                           - One diminutive polyp in the sigmoid colon,                            removed with a cold snare. Resected  and retrieved.                           - Internal hemorrhoids.                           - The examination was otherwise normal. Recommendation:           - Patient has a contact number available for                            emergencies. The signs and symptoms of potential                            delayed complications were discussed with the                            patient. Return to normal activities tomorrow.                            Written discharge instructions were provided to the  patient.                           - Resume previous diet.                           - Continue present medications.                           - Recommend topical diltiazem / lidocaine ointment                            - pea sized amount PR three times daily to treat                            anal fissure.                           - Keep stools soft, prevent constipation, use fiber                            supplement daily as needed                           - Await pathology results.                           - Referral to general surgery to evaluate soft                            tissue lesion noted on perianal exam. Will discuss                            with patient and his care taker Willaim Nelson. Jiyaan Steinhauser, MD 12/24/2022 4:02:45 PM This report has been signed electronically.

## 2022-12-24 NOTE — Patient Instructions (Signed)

## 2022-12-25 ENCOUNTER — Telehealth: Payer: Self-pay | Admitting: *Deleted

## 2022-12-25 NOTE — Telephone Encounter (Signed)
Returned the sister's phone call. Let her know that I notified Dr. Adela Lank for the request of an alternative medication to the Diltiazem ointment. She verbalized understanding.

## 2022-12-25 NOTE — Telephone Encounter (Signed)
  Follow up Call-     12/24/2022    2:55 PM 05/27/2021    1:47 PM  Call back number  Post procedure Call Back phone  # [phone]  Permission to leave phone message Yes Yes   LMOM

## 2022-12-25 NOTE — Telephone Encounter (Signed)
Inbound call from patient's sister stating that insurance does not cover medication that was called into pharmacy after yesterday's procedure. Patient's sister  is requesting a call to discuss an alternative. Please advise, thank you.

## 2022-12-26 ENCOUNTER — Telehealth: Payer: Self-pay | Admitting: *Deleted

## 2022-12-26 NOTE — Telephone Encounter (Signed)
-----   Message from Benancio Deeds sent at 12/24/2022  4:42 PM EST ----- Regarding: surgical referral Dottie can you please refer this patient to [facility] general surgery for soft tissue lesion on lower buttock near scrotum. Thanks

## 2022-12-26 NOTE — Telephone Encounter (Signed)
Patient's caregiver is advised that unfortunately, all of the medications that we give for anal fissures are compounded medications and are not covered by insurance. Advised that the diltiazem compound should cost around $30-40 dollars. Caregiver verbalizes understanding and would like script called to [facility] (which has been done).

## 2022-12-26 NOTE — Telephone Encounter (Signed)
Got it - I sent recommendations in other telephone note regarding this issue. Thanks

## 2022-12-26 NOTE — Telephone Encounter (Signed)
Dottie can you please let the patient know I was off yesterday PM and just seeing this message now.  The other options to treat his fissure would be topical nitroglycerin 0.125% ointment - pea sized amount TID. Given this and the topical diltiazem ointments are only available at compounding pharmacies [facility] is what we used initially) I am not sure how his insurance covers these but usually they have them for a standard rate around $30 or so I think. Not sure if that is possible for them or if the nitroglycerin ointment is covered by insurance that is fine as well. Thanks

## 2022-12-26 NOTE — Telephone Encounter (Signed)
Referral faxed to [facility]. Will await appointment information.

## 2022-12-29 LAB — SURGICAL PATHOLOGY

## 2023-01-04 ENCOUNTER — Encounter: Payer: Self-pay | Admitting: Gastroenterology

## 2023-01-13 NOTE — Telephone Encounter (Signed)
Patient scheduled with Dr Michaell Cowing 01/19/23.

## 2023-01-19 ENCOUNTER — Encounter: Payer: Self-pay | Admitting: Surgery

## 2023-01-19 ENCOUNTER — Ambulatory Visit: Payer: Self-pay | Admitting: Surgery

## 2023-01-19 DIAGNOSIS — Z860101 Personal history of adenomatous and serrated colon polyps: Secondary | ICD-10-CM | POA: Insufficient documentation

## 2023-01-19 DIAGNOSIS — F32A Depression, unspecified: Secondary | ICD-10-CM | POA: Insufficient documentation

## 2023-01-19 DIAGNOSIS — Z87898 Personal history of other specified conditions: Secondary | ICD-10-CM | POA: Insufficient documentation

## 2023-02-05 NOTE — Patient Instructions (Addendum)
 SURGICAL WAITING ROOM VISITATION Patients having surgery or a procedure may have no more than 2 support people in the waiting area - these visitors may rotate in the visitor waiting room.   Due to an increase in RSV and influenza rates and associated hospitalizations, children ages 54 and under may not visit patients in [facility] hospitals. If the patient needs to stay at the hospital during part of their recovery, the visitor guidelines for inpatient rooms apply.  PRE-OP VISITATION  Pre-op nurse will coordinate an appropriate time for 1 support person to accompany the patient in pre-op.  This support person may not rotate.  This visitor will be contacted when the time is appropriate for the visitor to come back in the pre-op area.  Please refer to the [facility] website for the visitor guidelines for Inpatients (after your surgery is over and you are in a regular room).  You are not required to quarantine at this time prior to your surgery. However, you must do this: Hand Hygiene often Do NOT share personal items Notify your provider if you are in close contact with someone who has COVID or you develop fever 100.4 or greater, new onset of sneezing, cough, sore throat, shortness of breath or body aches.  If you test positive for Covid or have been in contact with anyone that has tested positive in the last 10 days please notify you surgeon.    Your procedure is scheduled on:  Wednesday  February 18, 2023  Report to [facility] Main Entrance: [address] entrance where the [employer] is available.   Report to admitting at: 12:45    AM  Call this number if you have any questions or problems the morning of surgery [phone]  Do not eat food after Midnight the night prior to your surgery/procedure.  After Midnight you may have the following liquids until 12:00 noon the DAY OF SURGERY  Clear Liquid Diet Water Black Coffee (sugar ok, NO MILK/CREAM OR CREAMERS)  Tea (sugar ok,  NO MILK/CREAM OR CREAMERS) regular and decaf                             Plain Jell-O  with no fruit (NO RED)                                           Fruit ices (not with fruit pulp, NO RED)                                     Popsicles (NO RED)                                                                  Juice: NO CITRUS JUICES: only apple, WHITE grape, WHITE cranberry Sports drinks like Gatorade or Powerade (NO RED)                  FOLLOW ANY ADDITIONAL PRE OP INSTRUCTIONS YOU RECEIVED FROM YOUR SURGEON'S OFFICE!!!  BOWEL PREP INSTRUCTIONS  for Anal/Rectal Surgery:  DAY BEFORE YOUR SURGERY:   Obtain a bottle of MIRALAX at a grocery store pharmacy  Switch to drinking liquids or pureed foods only  Drink plenty of liquids all day to avoid getting dehydrated   10:00am: Take 2 doses (34g) Miralax.   2:00pm:  Take 2 more doses (34g) of Miralax   Midnight:  Do not eat anything solid   MORNING OF SURGERY   Remember to not to eat anything solid that morning  Hold or take medications as recommended by the hospital staff at your Preoperative visit   Stop drinking liquids before you leave the house (>3 hours prior to surgery)   ############################################################  If you have questions or problems, please call [facility] [phone]  to speak to someone in the clinic department at our office     Oral Hygiene is also important to reduce your risk of infection.        Remember - BRUSH YOUR TEETH THE MORNING OF SURGERY WITH YOUR REGULAR TOOTHPASTE  Do NOT smoke after Midnight the night before surgery.  STOP TAKING all Vitamins, Herbs and supplements 1 week before your surgery.   Take ONLY these medicines the morning of surgery with A SIP OF WATER: lamotrigine  (Lamictal ), sertraline  (Zoloft ), dexlansoprazole  (Dexilant ), aripiprazole  (Abilify ), cetirizine (Zyrtec) You may take Tylenol  if needed for pain.  You may use your Albuterol  inhaler  if needed.   If You have been diagnosed with Sleep Apnea - Bring CPAP mask and tubing day of surgery. We will provide you with a CPAP machine on the day of your surgery.                   You may not have any metal on your body including  jewelry, and body piercing  Do not wear lotions, powders,  cologne, or deodorant  Men may shave face and neck.  Contacts, Hearing Aids, dentures or bridgework may not be worn into surgery. DENTURES WILL BE REMOVED PRIOR TO SURGERY PLEASE DO NOT APPLY Poly grip OR ADHESIVES!!!  Patients discharged on the day of surgery will not be allowed to drive home.  Someone NEEDS to stay with you for the first 24 hours after anesthesia.  Do not bring your home medications to the hospital. The Pharmacy will dispense medications listed on your medication list to you during your admission in the Hospital.  Please read over the following fact sheets you were given: IF YOU HAVE QUESTIONS ABOUT YOUR PRE-OP INSTRUCTIONS, PLEASE CALL [phone]   [facility] - Preparing for Surgery Before surgery, you can play an important role.  Because skin is not sterile, your skin needs to be as free of germs as possible.  You can reduce the number of germs on your skin by washing with CHG (chlorahexidine gluconate) soap before surgery.  CHG is an antiseptic cleaner which kills germs and bonds with the skin to continue killing germs even after washing. Please DO NOT use if you have an allergy to CHG or antibacterial soaps.  If your skin becomes reddened/irritated stop using the CHG and inform your nurse when you arrive at Short Stay. Do not shave (including legs and underarms) for at least 48 hours prior to the first CHG shower.  You may shave your face/neck.  Please follow these instructions carefully:  1.  Shower with CHG Soap the night before surgery and the  morning of surgery.  2.  If you choose to wash your hair, wash your hair first as usual  with your normal  shampoo.  3.   After you shampoo, rinse your hair and body thoroughly to remove the shampoo.                             4.  Use CHG as you would any other liquid soap.  You can apply chg directly to the skin and wash.  Gently with a scrungie or clean washcloth.  5.  Apply the CHG Soap to your body ONLY FROM THE NECK DOWN.   Do not use on face/ open                           Wound or open sores. Avoid contact with eyes, ears mouth and genitals (private parts).                       Wash face,  Genitals (private parts) with your normal soap.             6.  Wash thoroughly, paying special attention to the area where your  surgery  will be performed.  7.  Thoroughly rinse your body with warm water from the neck down.  8.  DO NOT shower/wash with your normal soap after using and rinsing off the CHG Soap.            9.  Pat yourself dry with a clean towel.            10.  Wear clean pajamas.            11.  Place clean sheets on your bed the night of your first shower and do not  sleep with pets.  ON THE DAY OF SURGERY : Do not apply any lotions/deodorants the morning of surgery.  Please wear clean clothes to the hospital/surgery center.     FAILURE TO FOLLOW THESE INSTRUCTIONS MAY RESULT IN THE CANCELLATION OF YOUR SURGERY  PATIENT SIGNATURE_________________________________  NURSE SIGNATURE__________________________________  ________________________________________________________________________

## 2023-02-05 NOTE — Progress Notes (Addendum)
 Comments: patient is Autistic and lives with Tim and Tim Nelson and [place]) [phone]  [phone]   Sister is POA.   COVID Vaccine received:  []  No [x]  Yes Date of any COVID positive Test in last 90 days: none  PCP - Elberta Cone FNP at [facility] [place]  [phone] (Work)  [phone] (Fax)  Cardiologist - none  Chest x-ray - 07-08-2019  2v  Epic EKG - 07-15-2019  Epic   Will repeat at PST Stress Test -  ECHO - 06-06-2021  Epic Cardiac Cath -   PCR screen: []  Ordered & Completed []   No Order but Needs PROFEND     [x]   N/A for this surgery  Surgery Plan:  [x]  Ambulatory   []  Outpatient in bed  []  Admit      Patient and sister requested to stay overnight if possible. The surgery isn't scheduled until 1500 and his sister is POA. She doesn't think she can manage him at home that night.  Anesthesia:    [x]  General  []  Spinal  []   Choice []   MAC  Bowel Prep - []  No  [x]   Yes __Miralax___pt aware  Pacemaker / ICD device [x]  No []  Yes   Spinal Cord Stimulator:[x]  No []  Yes       History of Sleep Apnea? []  No [x]  Yes   CPAP used?- []  No [x]  Yes    Does the patient monitor blood sugar?   []  N/A   []  No [x]  Yes  Patient has: []  NO Hx DM   [x]  Pre-DM   []  DM1  []   DM2 Last A1c was: 6.2  on   06-13-2022   Does patient have a [employer] or Dexacom? [x]  No []  Yes   Fasting Blood Sugar Ranges-  Checks Blood Sugar _1  times a week  Metformin  500mg   daily,  hold DOS  Blood Thinner / Instructions: None Aspirin Instructions:  none  ERAS Protocol Ordered: []  No  [x]  Yes PRE-SURGERY []  ENSURE  []  G2   [x]  No Drink Ordered Patient is to be NPO after: 1200  Dental hx: []  Dentures:  [x]  N/A      []  Bridge or Partial:                   []  Loose or Damaged teeth:   Patient was lucid and appropriate with his answers to all my PST questions. His sister, Tim Nelson, was present and did help him clarify some of his answers. He has been taking all his medications as  directed with no lapses. Per his sister, Tim Nelson is capable of signing his consent and has the capacity to understand all of his surgery information.   Activity level: Patient is able to climb a flight of stairs without difficulty; [x]  No CP  [x]  No SOB  Patient can  perform ADLs without assistance.   Anesthesia review: Pre-DM, OSA-CPAP, Autism, GAD, MDD, fatty liver, ?LFTs, Lynch syndrome, HTN  Patient denies shortness of breath, fever, cough and chest pain at PAT appointment.  Patient and his sister, Tim Nelson verbalized understanding and agreement to the Pre-Surgical Instructions that were given to them at this PAT appointment. Patient was also educated of the need to review these PAT instructions again prior to his surgery.I reviewed the appropriate phone numbers to call if they have any and questions or concerns.

## 2023-02-06 ENCOUNTER — Encounter (HOSPITAL_COMMUNITY): Payer: Self-pay

## 2023-02-06 ENCOUNTER — Other Ambulatory Visit: Payer: Self-pay

## 2023-02-06 ENCOUNTER — Encounter (HOSPITAL_COMMUNITY)
Admission: RE | Admit: 2023-02-06 | Discharge: 2023-02-06 | Disposition: A | Discharge status: Home / Self Care | Payer: Medicare Other | Source: Ambulatory Visit | Attending: Surgery | Admitting: Surgery

## 2023-02-06 VITALS — BP 110/73 | HR 62 | Temp 99.0°F | Resp 16 | Ht 69.0 in | Wt 244.0 lb

## 2023-02-06 DIAGNOSIS — K760 Fatty (change of) liver, not elsewhere classified: Secondary | ICD-10-CM | POA: Insufficient documentation

## 2023-02-06 DIAGNOSIS — Z79899 Other long term (current) drug therapy: Secondary | ICD-10-CM | POA: Diagnosis not present

## 2023-02-06 DIAGNOSIS — R001 Bradycardia, unspecified: Secondary | ICD-10-CM | POA: Diagnosis not present

## 2023-02-06 DIAGNOSIS — Z01818 Encounter for other preprocedural examination: Secondary | ICD-10-CM | POA: Insufficient documentation

## 2023-02-06 DIAGNOSIS — R7303 Prediabetes: Secondary | ICD-10-CM

## 2023-02-06 DIAGNOSIS — I152 Hypertension secondary to endocrine disorders: Secondary | ICD-10-CM | POA: Diagnosis not present

## 2023-02-06 DIAGNOSIS — E1159 Type 2 diabetes mellitus with other circulatory complications: Secondary | ICD-10-CM | POA: Diagnosis not present

## 2023-02-06 HISTORY — DX: Prediabetes: R73.03

## 2023-02-06 LAB — COMPREHENSIVE METABOLIC PANEL
ALT: 34 U/L (ref 0–44)
AST: 22 U/L (ref 15–41)
Albumin: 4.1 g/dL (ref 3.5–5.0)
Alkaline Phosphatase: 46 U/L (ref 38–126)
Anion gap: 8 (ref 5–15)
BUN: 21 mg/dL — ABNORMAL HIGH (ref 6–20)
CO2: 25 mmol/L (ref 22–32)
Calcium: 9.3 mg/dL (ref 8.9–10.3)
Chloride: 104 mmol/L (ref 98–111)
Creatinine, Ser: 0.78 mg/dL (ref 0.61–1.24)
GFR, Estimated: >60 mL/min (ref >60)
Glucose, Bld: 97 mg/dL (ref 70–99)
Potassium: 4.1 mmol/L (ref 3.5–5.1)
Sodium: 137 mmol/L (ref 135–145)
Total Bilirubin: 0.7 mg/dL (ref 0.0–1.2)
Total Protein: 7.6 g/dL (ref 6.5–8.1)

## 2023-02-06 LAB — CBC
HCT: 44.9 % (ref 39.0–52.0)
Hemoglobin: 15.3 g/dL (ref 13.0–17.0)
MCH: 29.9 pg (ref 26.0–34.0)
MCHC: 34.1 g/dL (ref 30.0–36.0)
MCV: 87.9 fL (ref 80.0–100.0)
Platelets: 315 10*3/uL (ref 150–400)
RBC: 5.11 MIL/uL (ref 4.22–5.81)
RDW: 12.2 % (ref 11.5–15.5)
WBC: 9.2 10*3/uL (ref 4.0–10.5)
nRBC: 0 % (ref 0.0–0.2)

## 2023-02-18 ENCOUNTER — Ambulatory Visit (HOSPITAL_COMMUNITY): Payer: Medicare Other | Admitting: Physician Assistant

## 2023-02-18 ENCOUNTER — Other Ambulatory Visit: Payer: Self-pay

## 2023-02-18 ENCOUNTER — Encounter (HOSPITAL_COMMUNITY): Payer: Self-pay | Admitting: Surgery

## 2023-02-18 ENCOUNTER — Ambulatory Visit (HOSPITAL_BASED_OUTPATIENT_CLINIC_OR_DEPARTMENT_OTHER): Payer: Medicare Other | Admitting: Anesthesiology

## 2023-02-18 ENCOUNTER — Encounter (HOSPITAL_COMMUNITY)
Admission: RE | Disposition: A | Discharge status: Home / Self Care | Payer: Self-pay | Source: Ambulatory Visit | Attending: Surgery

## 2023-02-18 ENCOUNTER — Ambulatory Visit (HOSPITAL_COMMUNITY)
Admission: RE | Admit: 2023-02-18 | Discharge: 2023-02-18 | Disposition: A | Discharge status: Home / Self Care | Payer: Medicare Other | Source: Ambulatory Visit | Attending: Surgery | Admitting: Surgery

## 2023-02-18 DIAGNOSIS — Z1509 Genetic susceptibility to other malignant neoplasm: Secondary | ICD-10-CM | POA: Diagnosis not present

## 2023-02-18 DIAGNOSIS — Z7984 Long term (current) use of oral hypoglycemic drugs: Secondary | ICD-10-CM | POA: Insufficient documentation

## 2023-02-18 DIAGNOSIS — K642 Third degree hemorrhoids: Secondary | ICD-10-CM | POA: Insufficient documentation

## 2023-02-18 DIAGNOSIS — K760 Fatty (change of) liver, not elsewhere classified: Secondary | ICD-10-CM | POA: Diagnosis not present

## 2023-02-18 DIAGNOSIS — Z800 Family history of malignant neoplasm of digestive organs: Secondary | ICD-10-CM | POA: Insufficient documentation

## 2023-02-18 DIAGNOSIS — Z6834 Body mass index (BMI) 34.0-34.9, adult: Secondary | ICD-10-CM | POA: Insufficient documentation

## 2023-02-18 DIAGNOSIS — F840 Autistic disorder: Secondary | ICD-10-CM | POA: Insufficient documentation

## 2023-02-18 DIAGNOSIS — G4733 Obstructive sleep apnea (adult) (pediatric): Secondary | ICD-10-CM | POA: Insufficient documentation

## 2023-02-18 DIAGNOSIS — K219 Gastro-esophageal reflux disease without esophagitis: Secondary | ICD-10-CM | POA: Insufficient documentation

## 2023-02-18 DIAGNOSIS — E119 Type 2 diabetes mellitus without complications: Secondary | ICD-10-CM | POA: Insufficient documentation

## 2023-02-18 DIAGNOSIS — K603 Anal fistula, unspecified: Secondary | ICD-10-CM | POA: Diagnosis not present

## 2023-02-18 DIAGNOSIS — K601 Chronic anal fissure: Secondary | ICD-10-CM | POA: Insufficient documentation

## 2023-02-18 DIAGNOSIS — E669 Obesity, unspecified: Secondary | ICD-10-CM | POA: Insufficient documentation

## 2023-02-18 DIAGNOSIS — R2242 Localized swelling, mass and lump, left lower limb: Secondary | ICD-10-CM | POA: Insufficient documentation

## 2023-02-18 DIAGNOSIS — I10 Essential (primary) hypertension: Secondary | ICD-10-CM | POA: Insufficient documentation

## 2023-02-18 DIAGNOSIS — Z860101 Personal history of adenomatous and serrated colon polyps: Secondary | ICD-10-CM | POA: Diagnosis not present

## 2023-02-18 HISTORY — PX: FISTULOTOMY: SHX6413

## 2023-02-18 HISTORY — PX: MASS EXCISION: SHX2000

## 2023-02-18 HISTORY — PX: RECTAL EXAM UNDER ANESTHESIA: SHX6399

## 2023-02-18 LAB — GLUCOSE, CAPILLARY: Glucose-Capillary: 94 mg/dL (ref 70–99)

## 2023-02-18 SURGERY — FISTULOTOMY
Anesthesia: General

## 2023-02-18 MED ORDER — GENTAMICIN SULFATE 40 MG/ML IJ SOLN
440.0000 mg | INTRAVENOUS | Status: AC
  Administered 2023-02-18: 440 mg via INTRAVENOUS
  Filled 2023-02-18: qty 11

## 2023-02-18 MED ORDER — FENTANYL CITRATE PF 50 MCG/ML IJ SOSY
25.0000 ug | PREFILLED_SYRINGE | INTRAMUSCULAR | Status: DC | PRN
  Administered 2023-02-18 (×2): 50 ug via INTRAVENOUS

## 2023-02-18 MED ORDER — AMISULPRIDE (ANTIEMETIC) 5 MG/2ML IV SOLN: 10.0000 mg | Freq: Once | INTRAVENOUS | Status: DC | PRN

## 2023-02-18 MED ORDER — ACETAMINOPHEN 500 MG PO TABS: 1000.0000 mg | ORAL_TABLET | ORAL | Status: DC

## 2023-02-18 MED ORDER — ALBUTEROL SULFATE HFA 108 (90 BASE) MCG/ACT IN AERS
INHALATION_SPRAY | RESPIRATORY_TRACT | Status: AC
  Filled 2023-02-18: qty 6.7

## 2023-02-18 MED ORDER — METHYLENE BLUE (ANTIDOTE) 1 % IV SOLN
INTRAVENOUS | Status: DC | PRN
  Administered 2023-02-18: 10 mL via SUBMUCOSAL

## 2023-02-18 MED ORDER — ACETAMINOPHEN 500 MG PO TABS
1000.0000 mg | ORAL_TABLET | ORAL | Status: AC
  Administered 2023-02-18: 1000 mg via ORAL
  Filled 2023-02-18: qty 2

## 2023-02-18 MED ORDER — ONDANSETRON HCL 4 MG/2ML IJ SOLN
INTRAMUSCULAR | Status: AC
  Filled 2023-02-18: qty 2

## 2023-02-18 MED ORDER — DIBUCAINE (PERIANAL) 1 % EX OINT
TOPICAL_OINTMENT | CUTANEOUS | Status: AC
  Filled 2023-02-18: qty 28

## 2023-02-18 MED ORDER — FENTANYL CITRATE (PF) 100 MCG/2ML IJ SOLN
INTRAMUSCULAR | Status: DC | PRN
  Administered 2023-02-18: 100 ug via INTRAVENOUS

## 2023-02-18 MED ORDER — BUPIVACAINE LIPOSOME 1.3 % IJ SUSP: 20.0000 mL | Freq: Once | INTRAMUSCULAR | Status: DC

## 2023-02-18 MED ORDER — CLINDAMYCIN PHOSPHATE 900 MG/50ML IV SOLN: 900.0000 mg | INTRAVENOUS | Status: DC

## 2023-02-18 MED ORDER — CLINDAMYCIN PHOSPHATE 900 MG/50ML IV SOLN
900.0000 mg | INTRAVENOUS | Status: AC
  Administered 2023-02-18: 900 mg via INTRAVENOUS
  Filled 2023-02-18: qty 50

## 2023-02-18 MED ORDER — DEXAMETHASONE SODIUM PHOSPHATE 10 MG/ML IJ SOLN
INTRAMUSCULAR | Status: AC
  Filled 2023-02-18: qty 1

## 2023-02-18 MED ORDER — CHLORHEXIDINE GLUCONATE 0.12 % MT SOLN
15.0000 mL | Freq: Once | OROMUCOSAL | Status: AC
  Administered 2023-02-18: 15 mL via OROMUCOSAL

## 2023-02-18 MED ORDER — INSULIN ASPART 100 UNIT/ML IJ SOLN: 0.0000 [IU] | INTRAMUSCULAR | Status: DC | PRN

## 2023-02-18 MED ORDER — DIAZEPAM 5 MG PO TABS: 5.0000 mg | ORAL_TABLET | Freq: Three times a day (TID) | ORAL | 1 refills | Status: DC | PRN

## 2023-02-18 MED ORDER — ALBUTEROL SULFATE HFA 108 (90 BASE) MCG/ACT IN AERS
INHALATION_SPRAY | RESPIRATORY_TRACT | Status: DC | PRN
  Administered 2023-02-18: 8 via RESPIRATORY_TRACT

## 2023-02-18 MED ORDER — DEXAMETHASONE SODIUM PHOSPHATE 10 MG/ML IJ SOLN
INTRAMUSCULAR | Status: DC | PRN
  Administered 2023-02-18: 10 mg via INTRAVENOUS

## 2023-02-18 MED ORDER — GENTAMICIN SULFATE 40 MG/ML IJ SOLN
440.0000 mg | INTRAVENOUS | Status: DC
  Filled 2023-02-18: qty 11

## 2023-02-18 MED ORDER — CHLORHEXIDINE GLUCONATE CLOTH 2 % EX PADS: 6.0000 | MEDICATED_PAD | Freq: Once | CUTANEOUS | Status: DC

## 2023-02-18 MED ORDER — FENTANYL CITRATE PF 50 MCG/ML IJ SOSY
PREFILLED_SYRINGE | INTRAMUSCULAR | Status: AC
  Filled 2023-02-18: qty 1

## 2023-02-18 MED ORDER — ROCURONIUM BROMIDE 100 MG/10ML IV SOLN
INTRAVENOUS | Status: DC | PRN
  Administered 2023-02-18: 50 mg via INTRAVENOUS

## 2023-02-18 MED ORDER — GABAPENTIN 300 MG PO CAPS
300.0000 mg | ORAL_CAPSULE | ORAL | Status: AC
  Administered 2023-02-18: 300 mg via ORAL
  Filled 2023-02-18: qty 1

## 2023-02-18 MED ORDER — DIBUCAINE (PERIANAL) 1 % EX OINT
TOPICAL_OINTMENT | CUTANEOUS | Status: DC | PRN
  Administered 2023-02-18: 1 via RECTAL

## 2023-02-18 MED ORDER — ORAL CARE MOUTH RINSE: 15.0000 mL | Freq: Once | OROMUCOSAL | Status: AC

## 2023-02-18 MED ORDER — LACTATED RINGERS IV SOLN: INTRAVENOUS | Status: DC

## 2023-02-18 MED ORDER — ONDANSETRON HCL 4 MG/2ML IJ SOLN: 4.0000 mg | Freq: Once | INTRAMUSCULAR | Status: DC | PRN

## 2023-02-18 MED ORDER — SUGAMMADEX SODIUM 200 MG/2ML IV SOLN
INTRAVENOUS | Status: DC | PRN
  Administered 2023-02-18: 200 mg via INTRAVENOUS

## 2023-02-18 MED ORDER — BUPIVACAINE LIPOSOME 1.3 % IJ SUSP
INTRAMUSCULAR | Status: AC
  Filled 2023-02-18: qty 20

## 2023-02-18 MED ORDER — LIDOCAINE HCL (PF) 2 % IJ SOLN
INTRAMUSCULAR | Status: AC
  Filled 2023-02-18: qty 5

## 2023-02-18 MED ORDER — BUPIVACAINE-EPINEPHRINE 0.25% -1:200000 IJ SOLN
INTRAMUSCULAR | Status: AC
  Filled 2023-02-18: qty 1

## 2023-02-18 MED ORDER — ONDANSETRON HCL 4 MG/2ML IJ SOLN
INTRAMUSCULAR | Status: DC | PRN
  Administered 2023-02-18: 4 mg via INTRAVENOUS

## 2023-02-18 MED ORDER — BUPIVACAINE-EPINEPHRINE 0.25% -1:200000 IJ SOLN
INTRAMUSCULAR | Status: DC | PRN
  Administered 2023-02-18: 30 mL

## 2023-02-18 MED ORDER — PROPOFOL 10 MG/ML IV BOLUS
INTRAVENOUS | Status: DC | PRN
  Administered 2023-02-18: 150 mg via INTRAVENOUS

## 2023-02-18 MED ORDER — MIDAZOLAM HCL 2 MG/2ML IJ SOLN
INTRAMUSCULAR | Status: AC
  Filled 2023-02-18: qty 2

## 2023-02-18 MED ORDER — BUPIVACAINE LIPOSOME 1.3 % IJ SUSP
INTRAMUSCULAR | Status: DC | PRN
  Administered 2023-02-18: 20 mL

## 2023-02-18 MED ORDER — PROPOFOL 10 MG/ML IV BOLUS
INTRAVENOUS | Status: AC
  Filled 2023-02-18: qty 20

## 2023-02-18 MED ORDER — LACTATED RINGERS IV SOLN: INTRAVENOUS | Status: DC | PRN

## 2023-02-18 MED ORDER — MIDAZOLAM HCL 5 MG/5ML IJ SOLN
INTRAMUSCULAR | Status: DC | PRN
  Administered 2023-02-18: 2 mg via INTRAVENOUS

## 2023-02-18 MED ORDER — LIDOCAINE HCL (CARDIAC) PF 100 MG/5ML IV SOSY
PREFILLED_SYRINGE | INTRAVENOUS | Status: DC | PRN
  Administered 2023-02-18: 100 mg via INTRAVENOUS

## 2023-02-18 MED ORDER — FENTANYL CITRATE (PF) 100 MCG/2ML IJ SOLN
INTRAMUSCULAR | Status: AC
  Filled 2023-02-18: qty 2

## 2023-02-18 MED ORDER — OXYCODONE HCL 5 MG PO TABS: 5.0000 mg | ORAL_TABLET | Freq: Four times a day (QID) | ORAL | 0 refills | Status: DC | PRN

## 2023-02-18 SURGICAL SUPPLY — 37 items
BAG COUNTER SPONGE SURGICOUNT (BAG) IMPLANT
BENZOIN TINCTURE PRP APPL 2/3 (GAUZE/BANDAGES/DRESSINGS) ×2 IMPLANT
BLADE SURG 15 STRL LF DISP TIS (BLADE) IMPLANT
BRIEF MESH DISP LRG (UNDERPADS AND DIAPERS) ×2 IMPLANT
CHLORAPREP W/TINT 26 (MISCELLANEOUS) ×2 IMPLANT
CNTNR URN SCR LID CUP LEK RST (MISCELLANEOUS) ×2 IMPLANT
COVER SURGICAL LIGHT HANDLE (MISCELLANEOUS) ×2 IMPLANT
DRAPE LAPAROTOMY T 102X78X121 (DRAPES) ×2 IMPLANT
DRAPE LAPAROTOMY TRNSV 102X78 (DRAPES) IMPLANT
ELECT REM PT RETURN 15FT ADLT (MISCELLANEOUS) ×2 IMPLANT
GAUZE 4X4 16PLY ~~LOC~~+RFID DBL (SPONGE) ×2 IMPLANT
GAUZE PAD ABD 8X10 STRL (GAUZE/BANDAGES/DRESSINGS) IMPLANT
GAUZE SPONGE 4X4 12PLY STRL (GAUZE/BANDAGES/DRESSINGS) IMPLANT
GLOVE ECLIPSE 8.0 STRL XLNG CF (GLOVE) ×2 IMPLANT
GLOVE INDICATOR 8.0 STRL GRN (GLOVE) ×2 IMPLANT
GOWN STRL REUS W/ TWL XL LVL3 (GOWN DISPOSABLE) ×6 IMPLANT
KIT BASIN OR (CUSTOM PROCEDURE TRAY) ×2 IMPLANT
KIT TURNOVER KIT A (KITS) IMPLANT
LOOP VESSEL MAXI BLUE (MISCELLANEOUS) IMPLANT
NDL HYPO 22X1.5 SAFETY MO (MISCELLANEOUS) ×2 IMPLANT
NEEDLE HYPO 22X1.5 SAFETY MO (MISCELLANEOUS) ×1
PACK BASIC VI WITH GOWN DISP (CUSTOM PROCEDURE TRAY) ×2 IMPLANT
PACK GENERAL/GYN (CUSTOM PROCEDURE TRAY) ×2 IMPLANT
PENCIL BUTTON HOLSTER BLD 10FT (ELECTRODE) ×2 IMPLANT
SCRUB CHG 4% DYNA-HEX 4OZ (MISCELLANEOUS) ×2 IMPLANT
SPIKE FLUID TRANSFER (MISCELLANEOUS) ×2 IMPLANT
SPONGE T-LAP 4X18 ~~LOC~~+RFID (SPONGE) IMPLANT
SURGILUBE 2OZ TUBE FLIPTOP (MISCELLANEOUS) ×2 IMPLANT
SUT CHROMIC 2 0 SH (SUTURE) ×2 IMPLANT
SUT MNCRL AB 4-0 PS2 18 (SUTURE) IMPLANT
SUT VIC AB 2-0 UR6 27 (SUTURE) ×12 IMPLANT
SUT VIC AB 3-0 SH 27XBRD (SUTURE) IMPLANT
SYR 20ML LL LF (SYRINGE) ×2 IMPLANT
SYR 3ML LL SCALE MARK (SYRINGE) IMPLANT
TOWEL OR 17X24 6PK STRL BLUE (TOWEL DISPOSABLE) IMPLANT
TOWEL OR 17X26 10 PK STRL BLUE (TOWEL DISPOSABLE) ×2 IMPLANT
YANKAUER SUCT BULB TIP 10FT TU (MISCELLANEOUS) ×2 IMPLANT

## 2023-02-18 NOTE — Discharge Instructions (Addendum)
 ##############################################  ANORECTAL SURGERY:  POST OPERATIVE INSTRUCTIONS  ######################################################################  EAT Start with a pureed / full liquid diet After 24 hours, gradually transition to a high fiber diet.    CONTROL PAIN Control pain so you can tolerate bowel movements,  walk, sleep, tolerate sneezing/coughing, and go up/down stairs.   HAVE A BOWEL MOVEMENT DAILY Keep your bowels regular to avoid problems.   Taking a fiber supplement 2-4 doses every day to keep bowels soft.   HAVE A BM BY THE SECOND POSTOPERATIVE DAY Use an antidairrheal to slow down diarrhea.   Call if not better after 2 tries  WALK Walk an hour a day.  Control your pain to do that.   CALL IF YOU HAVE PROBLEMS/CONCERNS Call if you are still struggling despite following these instructions. Call if you have concerns not answered by these instructions  ######################################################################    Take your usually prescribed home medications unless otherwise directed. Blood thinners:  You can restart any strong blood thinners after the second postoperative day  for example: COUMADIN (warfarin), XERELTO (rivaroxaban), ELIQUIS (apixaban), PLAVIX (clopidigrel), BRILINTA (ticagrelor), EFFIENT (prasugrel), PRADAXA (dabigatran), etc  Continue aspirin before & after surgery..     Some oozing/bleeding the first 1-2 weeks is common but should taper down & be small volume.    If you are passing many large clots or having uncontrolling bleeding, call your surgeon  DIET: Follow a light bland diet & liquids the first 24 hours after arrival home, such as soup, liquids, starches, etc.  Be sure to drink plenty of fluids.  Quickly advance to a usual solid diet within a few days.  Avoid fast food or heavy meals as your are more likely to get nauseated or have irregular bowels.  A low-fat, high-fiber diet for the rest of your life  is ideal.  Control pain to avoid problems with urinating or defecating:  Pain is best controlled by a usual combination of many methods TOGETHER: Warm baths/soaks or Ice packs Over the counter pain medication Prescription pain medications Topical creams  A.  Warm water baths or ice packs (30-60 minutes up to 8 times a day, especially after bowel meovements) will help.  Using ice for the first few days can help decrease swelling and bruising,  Use heat such as warm towels, sitz baths, warm baths, warm showers, etc to help relax tight/sore spots and speed recovery.   Some people prefer to use ice alone, heat alone, alternating between ice & heat.  Experiment to what works for you.    It is helpful to take an over-the-counter pain medication continuously for the first few weeks.  This helps people need to use less narcotics Choose one of the following that works best for you: Naproxen (Aleve, etc)  Two 220mg  tabs twice a day Ibuprofen (Advil, etc) Three 200mg  tabs four times a day (every meal & bedtime) Acetaminophen (Tylenol, etc) 500-650mg  four times a day (every meal & bedtime)  A  prescription for pain medication (such as oxycodone, hydrocodone, etc) should be given to you upon discharge.  Take your pain medication as prescribed.  If you are having problems/concerns with the prescription medicine (does not control pain, nausea, vomiting, rash, itching, etc), please call us [phone] to see if we need to switch you to a different pain medicine that will work better for you and/or control your side effect better. Most people will need 1-2 refills to get through the first couple of weeks when pain is the most  intense.  If you need a refill on your pain medication, please contact your pharmacy.  They will contact our office to request authorization. Prescriptions will not be filled after 5 pm or on week-ends.  If can take up to 48 hours for it to be filled & ready so avoid waiting until you  are down to thel ast pill.  A numbing topical cream (Dibucaine) may be given to you.  Many people find relief with topical creams.  Some people find it burns too much.  Experiment.  If it helps, use it.  If it burns, stop using it.  You also may receive a prescription for diazepam, a muscle relaxant to help you to be able to urinate at first easily.  It is safe to take a few doses with the other medications as long as you are not planning to drive or do anything intense.  Hopefully this can minimize the chance of needing a Foley catheter into your bladder     Have a bowel movement every day  Until you have a bowel movement after surgery, he will struggle with urinating and controlling her pain.  Take a fiber supplement (such as Metamucil, Citrucel, FiberCon, MiraLax, etc) 2-4 doses a day to help prevent constipation from occurring.     If you have not had a bowel movement the second postoperative day:  -switch to drinking liquids only -take MiraLAX double dose every 2 hours x 3 or until you have a BM -If that does not work x 3 doses, increase to 4 doses every 2 hours (Like a small bowel prep) until you have a bowel movement. -Avoid enemas or suppositories (can harm sutures/repair) -Once you start having bowel movements, adjust your fiber supplement or Miralax dosing back down until you are having 1-2 soft well formed BMs a day.  (Start at 2 doses a day & adjust up or down to avoid diarrhea or recurrent constipation)   Watch out for diarrhea.  If you have many loose bowel movements, simplify your diet to bland foods & liquids for a few days.  Stop any stool softeners and decrease your fiber supplement.  Switching to mild anti-diarrheal medications (Kayopectate, Pepto Bismol) can help.  Can try an imodium/loperamide dose.  If this worsens or does not improve, please call us.  Wound Care   a. You have some fluffed cotton on top of the anus to help catch drainage and bleeding.  THERE IS NO  PACKING INSIDE THE RECTUM -Let the cotton fall off with the first bowel movement or shower.  It is okay to reinforce or replace as needed.  Bleeding is common at first and gradually slows down after a few weeks   b. Place soft cotton balls on the anus/wounds and use an absorbent pad in your underwear as needed to catch any drainage and help keep the area.  Try to use cotton balls or pads (regular gauze or toilet paper will stick and pull, causing pain.  Cotton will come off more easily).  OK to try dry powders such as cornstarch or baby powders   c. Keep the area clean and dry.  Bathe / shower every day.  Keep the area clean by showering / bathing over the incision / wound.   It is okay to soak an open wound to help wash it.  Consider using a squeeze bottle filled with warm water to gently wash the anal area.  Wet wipes or showers / gentle washing after bowel movements  is often less traumatic than regular toilet paper.           D.  Use a Sitz Bath 4-8 times a day for relief  A sitz bath is a warm water bath taken in the sitting position that covers only the hips and buttocks. It may be used for either healing or hygiene purposes. Sitz baths are also used to relieve pain, itching, or muscle spasms.  Gently cleaned the area and the heat will help lower spasm and offer better pain control.    Fill the bathtub half full with warm water. Sit in the water and open the drain a little. Turn on the warm water to keep the tub half full. Keep the water running constantly. Soak in the water for 15 to 20 minutes. After the sitz bath, pat the affected area dry first.   d. You will often notice bleeding, especially with bowel movements.  This should slow down by the end of the first week of surgery.  It can take over a month to more fully stop.  Sitting on an ice pack can help.   e. Expect some drainage.  You often will have some blood or yellow drainage with open wounds.  Sometimes she will get a little leaking  of liquid stool until the incision/wounds have fully close down.  This should slow down by the end of the first week of surgery, but you will have occasional bleeding or drainage up to a few months after surgery until all incisions & wounds have closed.  Wear an absorbent pad or soft cotton gauze in your underwear until the drainage stops.  ACTIVITIES as tolerated:    You may resume regular (light) daily activities beginning the next day--such as daily self-care, walking, climbing stairs--gradually increasing activities as tolerated.  If you can walk 30 minutes without difficulty, it is safe to try more intense activity such as jogging, treadmill, bicycling, low-impact aerobics, swimming, etc. Save the most intensive and strenuous activity for last such as sit-ups, heavy lifting, contact sports, etc  Refrain from any heavy lifting or straining until you are off narcotics for pain control.   DO NOT PUSH THROUGH PAIN.  Let pain be your guide: If it hurts to do something, don't do it.  Pain is your body warning you to avoid that activity for another week until the pain goes down. You may drive when you are no longer taking prescription pain medication, you can comfortably sit for long periods of time, and you can safely maneuver your car and apply brakes. You may have sexual intercourse when it is comfortable.   6.  FOLLOW UP in our office Please call [facility] at [phone] to set up an appointment to see your surgeon in the office for a follow-up appointment approximately 3 weeks after your surgery. If tissue is sent for pathology, it usually takes a week for pathology results to come back.  We will make an effort to call you with results as soon as we get them.  If you have not heard back in a week and wish to know the results before your postop visit, please call our office and we will see if we can give feedback on the results Make sure that you call for this appointment the day you arrive home to  ensure a convenient appointment time.  7. IF YOU HAVE DISABILITY OR FAMILY LEAVE FORMS, BRING THEM TO THE OFFICE FOR PROCESSING.  DO NOT GIVE THEM TO YOUR  DOCTOR.        WHEN TO CALL us [phone]: Poor pain control Reactions / problems with new medications (rash/itching, nausea, etc)  Fever over 101.5 F (38.5 C) Inability to urinate Nausea and/or vomiting Worsening swelling or bruising Continued bleeding from incision. Increased pain, redness, or drainage from the incision  The clinic staff is available to answer your questions during regular business hours (8:30am-5pm).  Please don't hesitate to call and ask to speak to one of our nurses for clinical concerns.   A surgeon from [facility] is always on call at the hospitals   If you have a medical emergency, go to the nearest emergency room or call 911.    [facility] [address], [place], [place]  [address] ? MAIN: [phone] ? TOLL FREE: [phone] ? FAX [phone] [email or web address]  #####################################################

## 2023-02-18 NOTE — Transfer of Care (Signed)
 Immediate Anesthesia Transfer of Care Note  Patient: Tim Nelson  Procedure(s) Performed: REPAIR OF PERIRECTAL FISTULA. LEFT LATERAL INTERNAL SPHINCTEROTOMY, FISTULOTOMY, HEMORRHOIDAL LIGATION/RECTOPEXY TIMES THREE, MARSUPIALIZATION ANORECTAL EXAM UNDER ANESTHESIA REMOVAL OF MASS THIGH 5CM  Patient Location: PACU  Anesthesia Type:General  Level of Consciousness: drowsy  Airway & Oxygen Therapy: Patient Spontanous Breathing and Patient connected to face mask oxygen  Post-op Assessment: Report given to RN and Post -op Vital signs reviewed and stable  Post vital signs: Reviewed and stable  Last Vitals:  Vitals Value Taken Time  BP 138/97 02/18/23 1504  Temp 36.6 C 02/18/23 1504  Pulse 86 02/18/23 1509  Resp 15 02/18/23 1509  SpO2 98 % 02/18/23 1509  Vitals shown include unfiled device data.  Last Pain:  Vitals:   02/18/23 1324  TempSrc:   PainSc: 5          Complications: No notable events documented.

## 2023-02-18 NOTE — H&P (Signed)
 02/18/2023     REFERRING PHYSICIAN: Danette Duos*  Patient Care Team: Joenathan Muslim as PCP - General (Family Medicine) Hershell Lose, Annye Kinds, MD as Consulting Provider (General Surgery) Armbruster, Edmonia Gottron, MD (Gastroenterology)  PROVIDER: Girtha Lama, MD  [facility] MRN: [id number] DOB: [birth year]  SUBJECTIVE   Chief Complaint: New Consultation   Tim Nelson is a 48 y.o. male  who is seen today as an office consultation  at the request of DrGeneral Kenner  for evaluation of anal fissure and scrotal mass  History of Present Illness:  Male with health issues. Diabetes. Sleep apnea. Hypertension. Hyperlipidemia. Question of some autism/mood disorder. Obesity. Has some GERD with esophageal strictures. On PPI twice daily. History of MLH1 related Lynch syndrome. Followed by [facility] gastroenterology. Underwent screening colonoscopy. Couple tubular adenomatous removed. Fissure noted at anal verge. Persistent mass noted on external exam near scrotum. Surgical consultation requested.  He comes today with his sister. He used to live with his mother but she unfortunately passed away so sister and brother a lot help take care of him. He claims to move his bowels most days but they occasionally can be very hard. Sister is trying to get him compliant on raisin bran and higher fiber diet. That is helped. However he will get complaints of pain with bowel movements and drainage and moisture and having to wipe a lot. Been told the past it was hemorrhoids versus a fissure. He has never had any surgery down there. Denies any history of Crohn's or ulcerative colitis. He does have heartburn reflux issues and has a known GL stricture. Can he is on omeprazole  twice a day. Often has a lot of coughing and sometimes even tussive emesis. Usually walk at least 20 minutes without much difficulty. He does not smoke. He does have diabetes on metformin. No bad diarrhea or constipation or bowel  changes on that. I believe he has sleep apnea as well.  Medical History:  Past Medical History:  Diagnosis Date  Anxiety  End-stage liver disease (CMS/HHS-HCC)  GERD (gastroesophageal reflux disease)  Hyperlipidemia  Hypertension  Sleep apnea   Patient Active Problem List  Diagnosis  Antimitochondrial antibody positive  Autism (HHS-HCC)  GAD (generalized anxiety disorder)  Family history of malignant neoplasm of gastrointestinal tract  Gastroesophageal reflux disease  Hemorrhoids  Hepatic steatosis  Hypertension associated with diabetes (CMS/HHS-HCC)  MLH1-related Lynch syndrome (HNPCC2)  Mixed hyperlipidemia  Mood disorder (CMS-HCC)  OSA on CPAP  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)  Dysthymic disorder  Chronic rhinitis  Early hepatic fibrosis  Elevated liver enzymes  Obesity (BMI 30.0-34.9)  History of adenomatous polyp of colon  Anal fistula  Mass of soft tissue of thigh  Umbilical hernia without obstruction and without gangrene  Supraumbilical hernia   Past Surgical History:  Procedure Laterality Date  HERNIA REPAIR    Allergies  Allergen Reactions  Brompheniramin-Phenylephrin-Dm Shortness Of Breath and Swelling  Cefaclor Rash, Shortness Of Breath and Swelling  Brompheniram-Ppa-Acetaminophen  Other (See Comments)  Brompheniramine-Ppa Rash and Other (See Comments)  Furosemide Rash   Current Outpatient Medications on File Prior to Visit  Medication Sig Dispense Refill  ARIPiprazole  (ABILIFY ) 5 MG tablet Take 1 tablet by mouth once daily  atorvastatin (LIPITOR) 10 MG tablet Take 10 mg by mouth every evening  cetirizine (ZYRTEC) 10 MG tablet Take 10 mg by mouth once daily  dexlansoprazole  (DEXILANT ) 60 mg DR capsule Take 60 mg by mouth once daily  fenofibrate micronized (LOFIBRA) 134 MG capsule Take  134 mg by mouth  hydroCHLOROthiazide (HYDRODIURIL) 25 MG tablet Take 1 tablet by mouth once daily  lamoTRIgine  (LAMICTAL ) 25 MG tablet Take 2  tablets by mouth once daily  lisinopriL (ZESTRIL) 5 MG tablet Take 5 mg by mouth once daily  metFORMIN (GLUCOPHAGE-XR) 500 MG XR tablet Take 1 tablet by mouth daily with breakfast  montelukast (SINGULAIR) 10 mg tablet Take 1 tablet by mouth once daily  sertraline  (ZOLOFT ) 50 MG tablet Take 50 mg by mouth once daily   No current facility-administered medications on file prior to visit.   Family History  Problem Relation Age of Onset  Coronary Artery Disease (Blocked arteries around heart) Mother  Stroke Mother  Hyperlipidemia (Elevated cholesterol) Mother  Diabetes Mother  Deep vein thrombosis (DVT or abnormal blood clot formation) Mother  Colon cancer Mother  Diabetes Father  Obesity Father  High blood pressure (Hypertension) Father  Hyperlipidemia (Elevated cholesterol) Father  Coronary Artery Disease (Blocked arteries around heart) Father  Deep vein thrombosis (DVT or abnormal blood clot formation) Father  Diabetes Sister  Colon cancer Brother  Deep vein thrombosis (DVT or abnormal blood clot formation) Brother    Social History   Tobacco Use  Smoking Status Never  Smokeless Tobacco Never    Social History   Socioeconomic History  Marital status: Single  Tobacco Use  Smoking status: Never  Smokeless tobacco: Never  Substance and Sexual Activity  Alcohol use: Never  Drug use: Never   Social Drivers of Health   Food Insecurity: Low Risk (10/08/2022)  Received from [facility]  Hunger Vital Sign  Worried About Running Out of Food in the Last Year: Never true  Ran Out of Food in the Last Year: Never true  Transportation Needs: No Transportation Needs (10/08/2022)  Received from [employer]  In the past 12 months, has lack of reliable transportation kept you from medical appointments, meetings, work or from getting things needed for daily living? : No  Received from [employer]  Social Network  Housing Stability: Low Risk (10/08/2022)  Received  from [employer] Stability Vital Sign  What is your living situation today?: I have a steady place to live  Think about the place you live. Do you have problems with any of the following? Choose all that apply:: None/None on this list   ############################################################  Review of Systems: A complete review of systems (ROS) was obtained from the patient.  We have reviewed this information and discussed as appropriate with the patient.  See HPI as well for other pertinent ROS.  Constitutional: No fevers, chills, sweats. Weight stable Eyes: No vision changes, No discharge HENT: No sore throats, nasal drainage Lymph: No neck swelling, No bruising easily Pulmonary: No cough, productive sputum CV: No orthopnea, PND . No exertional chest/neck/shoulder/arm pain. Patient can walk 20 minutes without difficulty.   GI: Lynch syndrome with strong family history of colorectal cancer as noted above. No personal nor family history of other GI cancer, nor inflammatory bowel disease, irritable bowel syndrome, allergy such as Celiac Sprue, dietary/dairy problems, colitis, ulcers nor gastritis. No recent sick contacts/gastroenteritis. No travel outside the country. No changes in diet.  Renal: No UTIs, No hematuria Genital: No drainage, bleeding, masses Musculoskeletal: No severe joint pain. Good ROM major joints Skin: No sores or lesions Heme/Lymph: No easy bleeding. No swollen lymph nodes Neuro: No active seizures. No facial droop Psych: No hallucinations. No agitation  OBJECTIVE   Vitals:  01/19/23 1650  BP: 122/87  Pulse: 100  Temp: 36.8 C (98.3 F)  SpO2: 96%  Weight: (!) 110.7 kg (244 lb)  Height: 180.3 cm (5\' 11" )   Body mass index is 34.03 kg/m.  PHYSICAL EXAM:  Constitutional: Not cachectic. Hygeine adequate. Vitals signs as above. Occasionally coughing. Eyes: No glasses. Vision adequate,Pupils reactive, normal extraocular movements. Sclera  nonicteric Neuro: CN II-XII intact. No major focal sensory defects. No major motor deficits. Lymph: No head/neck/groin lymphadenopathy Psych: No severe agitation. Answer simple questions but cannot explain things well no severe anxiety. Judgment & insight Impaired, Oriented x4, HENT: Normocephalic, Mucus membranes moist. No thrush. Hearing: adequate Neck: Supple, No tracheal deviation. No obvious thyromegaly Chest: No pain to chest wall compression. Good respiratory excursion. No audible wheezing CV: Pulses intact. regular. No major extremity edema Ext: No obvious deformity or contracture. Edema: Not present. No cyanosis Skin: No major subcutaneous nodules. Warm and dry Musculoskeletal: Severe joint rigidity not present. No obvious clubbing. No digital petechiae. Mobility: no assist device moving easily without restrictions  Abdomen: Obese Soft. Nondistended. Nontender. Hernia: 1 x 1 cm umbilical hernia with stalk reducible. Supraumbilically is a 3 x 3 cm mass in this subcutaneous tissue that will not reduce most likely consistent with chronically incarcerated hernia. Diastasis recti: Large supraumbilical midline. No hepatomegaly. No splenomegaly.  Genital/Pelvic: Some edema/swelling of phallus & scrotum.  Inguinal hernia: Not present. Inguinal lymph nodes: without lymphadenopathy nor hidradenitis.   Rectal:  Perianal skin Clean with good hygiene  Pruritis ani: Mild Pilonidal disease: Not present Condyloma / warts: Not present  Anal fissure: Some fissure in anal canal but with opening 1 cm from anal verge that seems more consistent with a superficial anal fistula more than fissure Perirectal abscess/fistula probable anal fistula posterior midline anal canal to left posterior perirectal region  External hemorrhoids Not present Digital and anoscopic rectal exam Barely tolerated  Sphincter tone Normal  Hemorrhoidal piles grade 2 internal hemorrhoids. Left lateral possibly grade 3 Prostate:  Normal size & no nodularity Rectovaginal septum: N/A Rectal masses: Not present  Other significant findings: Pedunculated mass in inner left thigh 5 x 3 cm. Rather lobulated but mostly soft.  Patient examined with assistance of male Medical Assistant in the room with patient in decubitus position .  ###################################    ###################################################################  Labs, Imaging and Diagnostic Testing:  Located in 'Care Everywhere' section of Epic EMR chart  PRIOR [facility] NOTES:  Not applicable  SURGERY NOTES:  Not applicable  PATHOLOGY:  Located in 'Care Everywhere' section of Epic EMR chart  Assessment and Plan:  DIAGNOSES:  Diagnoses and all orders for this visit:  Anal fistula  MLH1-related Lynch syndrome (HNPCC2)  OSA on CPAP  Obesity (BMI 30.0-34.9)  History of adenomatous polyp of colon  Mass of soft tissue of thigh  Autism (HHS-HCC)  Umbilical hernia without obstruction and without gangrene  Supraumbilical hernia    ASSESSMENT/PLAN  Pleasant 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 Most likely superficial. I think he would benefit from anorectal examination under anesthesia.  Possible superficial fistulotomy versus LIFT repair and/or internal partial sphincterotomy depending on findings. Would allow chance for some hemorrhoidal ligation as well.  The anatomy & physiology of the anorectal region was discussed. We discussed the pathophysiology of anorectal abscess and fistula. Differential diagnosis was discussed. Natural history progression was discussed. I stressed the importance of a bowel regimen to have daily soft bowel movements to minimize progression of  disease.   The patient's condition is not adequately controlled. Non-operative treatment has not healed the fistula. Therefore, I recommended examination under anaesthesia  to confirm the diagnosis and treat the fistula. I discussed techniques that may be required such as fistulotomy, ligation by LIFT technique, and/or seton placement. Benefits & alternatives discussed. I noted a good likelihood this will help address the problem, but sometimes repeat operations and prolonged healing times may occur. Risks such as bleeding, pain, recurrence, reoperation, injury to other organs, need for repair of tissues / organs reoperation, incontinence, heart attack, death, and other risks were discussed.   Educational handouts further explaining the pathology, treatment options, and bowel regimen were given. The patient and his sister especially expressed understanding & wish to proceed. We will work to coordinate surgery for a mutually convenient time.   Also pedunculated mass in inner thigh. Most likely a lipoma but rather large and irritated. Can remove that at the same time. Hopefully just excision with closure of all absorbable suture. The pathophysiology of skin & subcutaneous masses was discussed. Natural history risks without surgery were discussed. I recommended surgery to remove the mass. I explained the technique of removal with use of local anesthesia & possible need for more aggressive sedation/anesthesia for patient comfort.   Risks such as bleeding, infection, wound breakdown, heart attack, death, and other risks were discussed. I noted a good likelihood this will help address the problem. Possibility that this will not correct all symptoms was explained. Possibility of regrowth/recurrence of the mass was discussed. We will work to minimize complications. Questions were answered. The patient expresses understanding & wishes to proceed with surgery.  He does have some periumbilical hernias. In this obese male with diastases recti this would require repair with underlay mesh. I would not do this at the same time as the anorectal and thigh issues. He is not horribly symptomatic  at this time. I would like to see how he gets through the first surgery and anesthesia. If things go great and he is feeling well, can consider repair. Otherwise may just observe for now since they have been there since 2016 and are only slightly larger The anatomy & physiology of the abdominal wall was discussed. The pathophysiology of hernias was discussed. Natural history risks without surgery including progeressive enlargement, pain, incarceration, & strangulation was discussed. Contributors to complications such as smoking, obesity, diabetes, prior surgery, etc were discussed.   I feel the risks of no intervention will lead to serious problems that outweigh the operative risks; therefore, I recommended surgery to reduce and repair the hernia. I explained laparoscopic techniques with possible need for an open approach. I noted the probable use of mesh to patch and/or buttress the hernia repair  Risks such as bleeding, infection, abscess, need for further treatment, injury to other organs, need for repair of tissues / organs, stroke, heart attack, death, and other risks were discussed. I noted a good likelihood this will help address the problem. Goals of post-operative recovery were discussed as well. Possibility that this will not correct all symptoms was explained. I stressed the importance of low-impact activity, aggressive pain control, avoiding constipation, & not pushing through pain to minimize risk of post-operative chronic pain or injury. Possibility of reherniation especially with smoking, obesity, diabetes, immunosuppression, and other health conditions was discussed. We will work to minimize complications.   An educational handout further explaining the pathology & treatment options was given as well. Questions were answered. The patient specially his sister expressed  understanding and wish to consider surgery once he gets through this first anorectal intervention.

## 2023-02-18 NOTE — Anesthesia Postprocedure Evaluation (Signed)
 Anesthesia Post Note  Patient: Tim Nelson  Procedure(s) Performed: REPAIR OF PERIRECTAL FISTULA. LEFT LATERAL INTERNAL SPHINCTEROTOMY, FISTULOTOMY, HEMORRHOIDAL LIGATION/RECTOPEXY TIMES THREE, MARSUPIALIZATION ANORECTAL EXAM UNDER ANESTHESIA REMOVAL OF MASS THIGH 5CM     Patient location during evaluation: PACU Anesthesia Type: General Level of consciousness: awake and alert Pain management: pain level controlled Vital Signs Assessment: post-procedure vital signs reviewed and stable Respiratory status: spontaneous breathing, nonlabored ventilation and respiratory function stable Cardiovascular status: blood pressure returned to baseline and stable Postop Assessment: no apparent nausea or vomiting Anesthetic complications: no   No notable events documented.  Last Vitals:  Vitals:   02/18/23 1645 02/18/23 1730  BP: 111/86 111/86  Pulse: 72 72  Resp: 13 13  Temp:    SpO2: 93% 93%    Last Pain:  Vitals:   02/18/23 1730  TempSrc:   PainSc: 4                  Erin Havers

## 2023-02-18 NOTE — Anesthesia Preprocedure Evaluation (Addendum)
 Anesthesia Evaluation  Patient identified by MRN, date of birth, ID band Patient awake    Reviewed: Allergy & Precautions, NPO status , Patient's Chart, lab work & pertinent test results  Airway Mallampati: II  TM Distance: >3 FB Neck ROM: Full    Dental  (+) Teeth Intact, Dental Advisory Given, Poor Dentition   Pulmonary sleep apnea and Continuous Positive Airway Pressure Ventilation    Pulmonary exam normal breath sounds clear to auscultation       Cardiovascular hypertension, Pt. on medications Normal cardiovascular exam Rhythm:Regular Rate:Normal     Neuro/Psych  PSYCHIATRIC DISORDERS Anxiety Depression    negative neurological ROS     GI/Hepatic Neg liver ROS,GERD  Medicated,,Perirectal fistula/fissure. Grade 2 and 3 internal hemorrhoids. mass of inner thigh   Endo/Other  diabetes, Type 2, Oral Hypoglycemic Agents    Renal/GU negative Renal ROS     Musculoskeletal negative musculoskeletal ROS (+)    Abdominal   Peds  (+) Developmental delay Hematology negative hematology ROS (+)   Anesthesia Other Findings Day of surgery medications reviewed with the patient.  Reproductive/Obstetrics                              Anesthesia Physical Anesthesia Plan  ASA: 3  Anesthesia Plan: General   Post-op Pain Management: Tylenol  PO (pre-op)*   Induction: Intravenous  PONV Risk Score and Plan: 3 and Midazolam , Dexamethasone  and Ondansetron   Airway Management Planned: Oral ETT  Additional Equipment:   Intra-op Plan:   Post-operative Plan: Extubation in OR  Informed Consent: I have reviewed the patients History and Physical, chart, labs and discussed the procedure including the risks, benefits and alternatives for the proposed anesthesia with the patient or authorized representative who has indicated his/her understanding and acceptance.     Dental advisory given  Plan Discussed  with: CRNA  Anesthesia Plan Comments:        Anesthesia Quick Evaluation

## 2023-02-18 NOTE — Op Note (Signed)
 02/18/2023  2:50 PM  PATIENT:  Tim Nelson  48 y.o. male  Patient Care Team: Joenathan Muslim, FNP as PCP - General (Family Medicine) Wray Heady, MD as Consulting Physician (Psychiatry) Armbruster, Lendon Queen, MD as Consulting Physician (Gastroenterology) Candyce Champagne, MD as Consulting Physician (General Surgery)  PRE-OPERATIVE DIAGNOSIS:   CHRONIC ANAL FISSURE, POSSIBLE ANAL FISTULA - SUPERFICIAL HEMORRHOID - INTERNAL GRADE 3 PROLAPSING HEMORRHOID - INTERNAL GRADE 2 PROLAPSING POSTERIOR THIGH MASS  POST-OPERATIVE DIAGNOSIS:   ANAL FISSURE ANAL FISTULA - SUPERFICIAL HEMORRHOID - INTERNAL GRADE 3 PROLAPSING HEMORRHOID - INTERNAL GRADE 2 PROLAPSING POSTERIOR THIGH MASS  PROCEDURE:  ANAL FISTULOTOMY WITH MARSUPIALIZATION ANAL SPHINCTEROTOMY - internal sphincter - partial  HEMORRHOIDAL LIGATION & HEMORRHOIDOPEXY x 3 EXCISION OF POSTERIOR THIGH MASS  SURGEON:  Eddye Goodie, MD  ASSISTANT:  (n/a)   ANESTHESIA:  General endotracheal intubation anesthesia (GETA) and Anorectal and field block for perioperative & postoperative pain control provided with liposomal bupivacaine  (Experel) 20mL mixed with 30mL of bupivicaine 0.25% with epinephrine   Estimated Blood Loss (EBL):   No intake/output data recorded..   (See anesthesia record)  Delay start of Pharmacological VTE agent (>24hrs) due to concerns of significant anemia, surgical blood loss, or risk of bleeding?:  no  DRAINS: (None)  SPECIMENS:   -Left posterior pedunculated thigh mass.  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  INDICATION: Patient with probable chronic anal fissure possible fistula refractory to bowel regimen & medical management.   Patient also with chronically irritated skin mass of moderate size in his upper posterior thigh near his scrotum and perirectal region.  I recommended examination and surgical treatment:  The anatomy & physiology of the anorectal region was discussed.  The pathophysiology of anal fissure and fistula differential diagnosis was discussed.  Natural history progression  was discussed.   I stressed the importance of a bowel regimen to have daily soft bowel movements to minimize progression of disease.     The patient's condition is not adequately controlled.  Non-operative treatment has not healed the fissure.  Therefore, I recommended examination under anesthesia for better examination to confirm the diagnosis and treat by lateral internal sphincterotomy to relax the spasm better & allow the fissure to heal.  Technique, benefits, alternatives were discussed.   I noted a good likelihood this will help address the problem.  Risks such as bleeding, pain, incontinence, recurrence, heart attack, death, and other risks were discussed.    Educational handouts further explaining the pathology, treatment options, and bowel regimen were given as well.  The patient expressed understanding & wishes to proceed with surgery.  OR FINDINGS: Patient had a posterior midline chronic anal fissure in anal canal with a hypertensive & hypertrophic sphincter.  Superficial fistula tract going more externally as well.  No evidence of any intersphincteric fistula. Sphincterotomy location:  Left lateral anal canal.  66% distal internal sphincterotomy performed   Grade 2 and 3 internal hemorrhoids, right anterior largest.  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  Left proximal posterior inner thigh.  Excision and closure done.  IDESCRIPTION:   Informed consent was confirmed. Patient  underwent general anesthesia  without difficulty. Patient was placed into  prone/jacknife positioning.  The perianal region was prepped and draped in sterile fashion. Surgical timeout confirmed or plan.  I did digital rectal examination and then transitioned over to anoscopy to get a sense of the anatomy.  Findings noted above.    Decided to excise the left posterior inner thigh lobulated pedunculated mass.  Excised transversely for a 2 x 1 cm elliptical base to get rid of the large pedunculated skin mass.  Closed that with 4 Monocryl deep dermal suture and Dermabond.  Incision was covered.  I focused on the anal canal.  I identified an anal fissure in the posterior midline anal canal.  The sphincter tone was increased.  No stricture.  No abscess located.  He had another opening 1 cm more distally with some scarring around it that tracked to the fissure consistent with a superficial fistula as well.  There is no evidence of any intersphincteric, transsphincteric, or deeper fistula.  I went ahead and proceeded with internal sphincterotomy technique.  Used 2-0 Vicryl suture on the left lateral rectal column 6 cm proximal to the anal verge and ran that in a running interrupted fashion until I came to the sphincter complex.  I opened through the anoderm of the left lateral anal canal longitudinally.  I identified the internal and external sphincters.  Elevated the internal sphincter -it was hypertensive about 150% normal size.  I proceeded with a partial internal sphincterotomy starting distally and moving proximally using cautery.  This involved the distal 66% thickness, resulting in internal sphincter about 50% normal size.  This provided improved relaxation of the anal sphincter.  I closed the anal canal wound vertically using 2-0 Vicryl suture in interrupted fashion to good result, leaving a 5 mm distal opening to allow drainage.  Hemostasis was excellent.  Patient also had irritated right anterior and  right posterior hemorrhoids.  I ligated those with 2-0 Vicryl suture in a vertical running fashion for good hemorrhoidal ligation and pexy.  I then focused on the posterior midline where the chronic anal fissure and fistula tract was.  Ended up excising and opening up the skin isthmus between the fissure and fissure opening to create a 3 x 3 cm posterior midline perianal wound.  Trimmed off excess tissue and marsupialized the wound using running interrupted chromic suture around the edges to keep the wound open and keep it from closing down.  Assured hemostasis.  I reexamined the anal canal.   There is was no narrowing.  Hemostasis was excellent.  I repeated anoscopy and examination.  Hemostasis was good.  Patient is being extubated go to recovery room.  I discussed operative findings, updated the patient's status, discussed probable steps to recovery, and gave postoperative recommendations to the patient's sister, Trenia Fritter .  Recommendations were made.  Questions were answered.  She expressed understanding & appreciation.   Eddye Goodie, M.D., F.A.C.S. Gastrointestinal and Minimally Invasive Surgery [facility] [address] [place], [place] [address] [phone] Main / Paging

## 2023-02-18 NOTE — Anesthesia Procedure Notes (Signed)
 Procedure Name: Intubation Date/Time: 02/18/2023 1:51 PM  Performed by: Josetta Niece, CRNAPre-anesthesia Checklist: Patient identified, Emergency Drugs available, Suction available, Patient being monitored and Timeout performed Patient Re-evaluated:Patient Re-evaluated prior to induction Oxygen Delivery Method: Circle system utilized Preoxygenation: Pre-oxygenation with 100% oxygen Induction Type: IV induction Ventilation: Mask ventilation without difficulty Laryngoscope Size: Mac and 3 Grade View: Grade I Tube type: Oral Tube size: 8.0 mm Number of attempts: 1 Airway Equipment and Method: Stylet Placement Confirmation: ETT inserted through vocal cords under direct vision, positive ETCO2 and breath sounds checked- equal and bilateral Secured at: 22 cm Tube secured with: Tape Dental Injury: Teeth and Oropharynx as per pre-operative assessment

## 2023-02-19 ENCOUNTER — Encounter (HOSPITAL_COMMUNITY): Payer: Self-pay | Admitting: Surgery

## 2023-02-20 LAB — SURGICAL PATHOLOGY

## 2023-03-11 ENCOUNTER — Ambulatory Visit: Payer: Medicare Other | Admitting: Pulmonary Disease

## 2023-03-11 ENCOUNTER — Encounter: Payer: Self-pay | Admitting: Pulmonary Disease

## 2023-03-11 VITALS — BP 110/70 | HR 80 | Ht 70.0 in | Wt 242.8 lb

## 2023-03-11 DIAGNOSIS — R0600 Dyspnea, unspecified: Secondary | ICD-10-CM | POA: Diagnosis not present

## 2023-03-11 DIAGNOSIS — Z23 Encounter for immunization: Secondary | ICD-10-CM

## 2023-03-11 DIAGNOSIS — G4733 Obstructive sleep apnea (adult) (pediatric): Secondary | ICD-10-CM

## 2023-03-11 NOTE — Progress Notes (Signed)
 Synopsis: Referred in April 2023 for shortness of breath  Subjective:   PATIENT ID: Tim Nelson GENDER: male DOB: [birth year], MRN: [id number]  HPI  Chief Complaint  Patient presents with   Follow-up    Pt is concerned ab cpap machine, pt says water is draining too quick.   Tim Nelson is a 48 year old male, never smoker with GERD, hypertension, and seasonal allergies who returns to pulmonary clinic for shortness of breath and obstructive sleep apnea.   He is experiencing issues with his CPAP machine, specifically that the water in the humidifier evaporates too quickly, causing him to breathe hot air during use. This occurs even when he sleeps for four to eight hours. He is concerned that the metal piece in the machine may be overheating. He has not yet contacted the medical equipment company about this issue.  He has been using the CPAP machine since January 16, 2023. From that date until February 14, 2023, there were only seven days when he used it for more than four hours. On two days, he used it for less than four hours, and on the remaining days, he did not use it at all due to concerns about the water level. He cleans the mask daily and the hosing and water chamber weekly, using distilled water from [facility] for the humidifier.  His father is currently hospitalized at [facility] due to high CO2 levels. His father had a virus two weeks ago, which led to vomiting and an inability to wear his CPAP mask, causing a buildup of CO2. His father is not on a ventilator but is using a CPAP machine in the hospital and is not in the ICU.  OV 06/09/22 CPAP download shows perfect compliance with AHI 2 or less. He has sore over the bridge of his nose and received foam covering for his mask but continued to have issues with sore. He reports his day time dyspnea is much improved. He has lost 10lbs since last visit due to diet.  OV 03/25/22 PFTs are within normal limits. He did not feel  improvement with inhaler therapy in the past. His breathing has been stable since last visit.  We discussed home sleep study results shoed an AHI of 62.7/hr with SpO2 nadir of 77%. He is amenable to starting CPAP therapy.   OV 12/10/21 He tried advair  but it was stopped because he was getting jittery after 1-2 days of use. He does not recall if his breathing was improved from the medication or not. He does feel his breathing is a bit better since last visit as he is not wheezing as much. He is trying to work on weight loss. His mother recently passed away and he has not been able to do the sleep study yet. Echo 05/2021 is unremarkable. He reports his GERD is improved.   Initial OV 05/20/21 He is accompanied by his sister and brother in law. He has history of autism and developmental delay. His mother was his primary care taker until she was recently placed on hospice so his sister is now helping with his care.   He has noticed increasing exertional dyspnea with intermittent wheezing. He also notices the wheezing when he lays down. He has significant weight gain over recent years. There is also concern for witnessed apneas in his sleep and snoring.   He report increased abdominal swelling and scrotal swelling along with hemorrhoids. There is concern for fatty liver disease and he is being evaluated at  [facility] by Dr. Hays of hepatology. He is followed by [facility] for history of GERD and history concerning for Lynch Syndrome.   He reports no child hood issues with his breathing. He is a never smoker. He does have second hand smoke exposure in childhood. He works occasionally for a [employer] and was not wearing protective masks/respirators.   Past Medical History:  Diagnosis Date   Allergy    Anxiety and depression    Autism    Dysthymic disorder    Enlarged liver    Esophageal stricture    Esophagitis, unspecified    Fatty liver    GERD  (gastroesophageal reflux disease)    Hepatic fibrosis    History of chronic cough    dry cough-sleeps with elevated head of bed.   Hx of colonic polyps    Hyperlipemia    Hypertension    Lynch syndrome    Pre-diabetes    Seasonal allergies      Family History  Problem Relation Age of Onset   Colon cancer Mother 8   Irritable bowel syndrome Mother    Colon polyps Mother 26   Diabetes Mother    Kidney cancer Mother 24   Kidney disease Father    Congestive Heart Failure Father    Dementia Father    Kidney failure Father    Colon polyps Brother 51   Colon cancer Brother 59   Liver cancer Brother    Ovarian cancer Maternal Aunt    Liver cancer Maternal Aunt    Stomach cancer Maternal Aunt 61   Brain cancer Maternal Aunt    Colon polyps Maternal Grandfather 60   Colon cancer Maternal Grandfather 67   Esophageal cancer Maternal Grandfather 72   Rectal cancer Neg Hx      Social History   Socioeconomic History   Marital status: Single    Spouse name: Not on file   Number of children: Not on file   Years of education: Not on file   Highest education level: Not on file  Occupational History   Occupation: Unemployed   Tobacco Use   Smoking status: Never    Passive exposure: Never   Smokeless tobacco: Never  Vaping Use   Vaping status: Never Used  Substance and Sexual Activity   Alcohol use: No   Drug use: No   Sexual activity: Never  Other Topics Concern   Not on file  Social History Narrative   Not on file   Social Drivers of Health   Financial Resource Strain: Not on file  Food Insecurity: Low Risk  (10/08/2022)   Received from [facility]   Hunger Vital Sign    Worried About Running Out of Food in the Last Year: Never true    Ran Out of Food in the Last Year: Never true  Transportation Needs: No Transportation Needs (10/08/2022)   Received from [facility]    In the past 12 months, has lack of reliable transportation kept you from  medical appointments, meetings, work or from getting things needed for daily living? : No  Physical Activity: Not on file  Stress: Not on file  Social Connections: Unknown (06/02/2021)   Received from [facility], [facility]   Social Network    Social Network: Not on file  Intimate Partner Violence: Unknown (05/09/2021)   [facility] from [facility], [facility]   HITS    Physically Hurt: Not  on file    Insult or Talk Down To: Not on file    Threaten Physical Harm: Not on file    Scream or Curse: Not on file     Allergies  Allergen Reactions   Cefaclor Shortness Of Breath and Swelling   Dimetapp Dm Cold-Cough [Phenylephrine -Bromphen-Dm] Shortness Of Breath and Swelling   Furosemide Rash   Reglan  [Metoclopramide ] Anxiety and Other (See Comments)    Weight loss/mental breakdown     Outpatient Medications Prior to Visit  Medication Sig Dispense Refill   acetaminophen  (TYLENOL ) 500 MG tablet Take 1,000 mg by mouth every 8 (eight) hours as needed for mild pain (pain score 1-3) or moderate pain (pain score 4-6).     ARIPiprazole  (ABILIFY ) 5 MG tablet TAKE ONE (1) TABLET EACH DAY 30 tablet 0   atorvastatin (LIPITOR) 10 MG tablet Take 10 mg by mouth every evening.      cetirizine (ZYRTEC) 10 MG tablet Take 10 mg by mouth daily.     fenofibrate micronized (LOFIBRA) 134 MG capsule Take 134 mg by mouth daily before breakfast.     hydrochlorothiazide (HYDRODIURIL) 25 MG tablet Take 25 mg by mouth daily.     lamoTRIgine  (LAMICTAL ) 25 MG tablet TAKE 1 TABLET DAILY FOR 1 WEEK THEN TAKE 2 TABLETS DAILY (Patient taking differently: Take 50 mg by mouth daily.) 60 tablet 0   lisinopril (ZESTRIL) 5 MG tablet Take 5 mg by mouth daily.     metFORMIN (GLUCOPHAGE-XR) 500 MG 24 hr tablet Take 1 tablet by mouth daily with breakfast.     montelukast (SINGULAIR) 10 MG tablet Take 10 mg by mouth at bedtime.     sertraline  (ZOLOFT ) 50 MG tablet Take 50 mg by mouth daily.     albuterol  (VENTOLIN  HFA) 108  (90 Base) MCG/ACT inhaler Inhale 2 puffs into the lungs every 6 (six) hours as needed (Cold). (Patient not taking: Reported on 03/11/2023)     AMBULATORY NON FORMULARY MEDICATION Medication Name: Diltiazem 2% and Lidocaine  2% ointment.  Apply a pea sized amount per rectum three times daily X4 weeks or until it heals. (Patient not taking: Reported on 03/11/2023) 30 g 0   diazepam  (VALIUM ) 5 MG tablet Take 1 tablet (5 mg total) by mouth every 8 (eight) hours as needed for muscle spasms (difficulty urinating). (Patient not taking: Reported on 03/11/2023) 6 tablet 1   metFORMIN (GLUCOPHAGE) 500 MG tablet Take 500 mg by mouth daily with breakfast. (Patient not taking: Reported on 03/11/2023)     oxyCODONE  (OXY IR/ROXICODONE ) 5 MG immediate release tablet Take 1 tablet (5 mg total) by mouth every 6 (six) hours as needed for severe pain (pain score 7-10) or breakthrough pain. (Patient not taking: Reported on 03/11/2023) 30 tablet 0   dexlansoprazole  (DEXILANT ) 60 MG capsule Take 1 capsule (60 mg total) by mouth daily. (Patient not taking: Reported on 03/11/2023) 90 capsule 3   No facility-administered medications prior to visit.   Review of Systems  Constitutional:  Negative for chills, fever, malaise/fatigue and weight loss.  HENT:  Negative for congestion, sinus pain and sore throat.   Eyes: Negative.   Respiratory:  Negative for cough, hemoptysis, sputum production, shortness of breath and wheezing.   Cardiovascular:  Negative for chest pain, palpitations, orthopnea, claudication and leg swelling.  Gastrointestinal:  Negative for abdominal pain, heartburn, nausea and vomiting.  Genitourinary: Negative.   Musculoskeletal:  Negative for joint pain and myalgias.  Skin:  Negative for rash.  Neurological:  Negative for  weakness.  Endo/Heme/Allergies:  Negative for environmental allergies.  Psychiatric/Behavioral: Negative.      Objective:   Vitals:   03/11/23 1438  BP: 110/70  Pulse: 80  SpO2: 96%  Weight:  242 lb 12.8 oz (110.1 kg)  Height: 5' 10 (1.778 m)    Physical Exam Constitutional:      General: He is not in acute distress.    Appearance: He is obese.  HENT:     Head: Normocephalic and atraumatic.  Eyes:     Conjunctiva/sclera: Conjunctivae normal.  Cardiovascular:     Rate and Rhythm: Normal rate and regular rhythm.     Pulses: Normal pulses.     Heart sounds: Normal heart sounds. No murmur heard. Pulmonary:     Effort: Pulmonary effort is normal.     Breath sounds: Normal breath sounds.  Musculoskeletal:     Right lower leg: No edema.     Left lower leg: No edema.  Skin:    General: Skin is warm and dry.  Neurological:     General: No focal deficit present.     Mental Status: He is alert.    CBC    Component Value Date/Time   WBC 9.2 02/06/2023 1352   RBC 5.11 02/06/2023 1352   HGB 15.3 02/06/2023 1352   HCT 44.9 02/06/2023 1352   PLT 315 02/06/2023 1352   MCV 87.9 02/06/2023 1352   MCH 29.9 02/06/2023 1352   MCHC 34.1 02/06/2023 1352   RDW 12.2 02/06/2023 1352   LYMPHSABS 2.2 05/26/2019 1439   MONOABS 0.8 05/26/2019 1439   EOSABS 0.2 05/26/2019 1439   BASOSABS 0.1 05/26/2019 1439   Chest imaging: CXR 04/12/21 Cardiovascular: Cardiac silhouette and pulmonary vasculature are within normal limits.  Mediastinum: Within normal limits.  Lungs/pleura: Appropriate pulmonary expansion without focal consolidative airspace disease identified. No overt pulmonary edema. No pleural effusions or discernible pneumothorax.  Upper abdomen: Visualized portions are unremarkable.  Chest wall/osseous structures: Unremarkable.  CXR 07/08/19 Heart and mediastinal contours are within normal limits. No focal opacities or effusions. No acute bony abnormality.  PFT:    Latest Ref Rng & Units 03/25/2022    5:05 PM  PFT Results  FVC-Predicted Pre % 99   FVC-Post L 4.88   FVC-Predicted Post % 96   Pre FEV1/FVC % % 82   Post FEV1/FCV % % 83   FEV1-Pre L 4.11   FEV1-Predicted  Pre % 103   FEV1-Post L 4.07   DLCO uncorrected ml/min/mmHg 31.48   DLCO UNC% % 106   DLCO corrected ml/min/mmHg 31.48   DLCO COR %Predicted % 106   DLVA Predicted % 109   TLC L 6.88   TLC % Predicted % 101   RV % Predicted % 107     Labs:  Path:  Echo 06/06/21: LV EF 60-65%. RV systolic function is normal.   Heart Catheterization:  Assessment & Plan:   Immunization due - Plan: Flu vaccine trivalent PF, 6mos and older(Flulaval,Afluria,Fluarix,Fluzone)  Discussion: Tim Nelson is a 48 year old male, never smoker with GERD, hypertension, and seasonal allergies who returns to pulmonary clinic for shortness of breath and OSA.  Obstructive Sleep Apnea Patient reports CPAP machine is evaporating water too quickly, causing discomfort and decreased usage. The machine was set at a humidity level of 4. -Reduced humidity setting to 2 to potentially decrease water evaporation rate. -Advised patient to contact medical equipment company for further evaluation of the machine. -Encouraged consistent use of CPAP machine, as  data shows usage on only 7 days over the past month.  General Health Maintenance -Administer influenza vaccine today. -Schedule follow-up appointment in three months to assess CPAP usage and machine function.  Dorn Chill, MD [facility] Office: [phone]   Current Outpatient Medications:    acetaminophen  (TYLENOL ) 500 MG tablet, Take 1,000 mg by mouth every 8 (eight) hours as needed for mild pain (pain score 1-3) or moderate pain (pain score 4-6)., Disp: , Rfl:    ARIPiprazole  (ABILIFY ) 5 MG tablet, TAKE ONE (1) TABLET EACH DAY, Disp: 30 tablet, Rfl: 0   atorvastatin (LIPITOR) 10 MG tablet, Take 10 mg by mouth every evening. , Disp: , Rfl:    cetirizine (ZYRTEC) 10 MG tablet, Take 10 mg by mouth daily., Disp: , Rfl:    fenofibrate micronized (LOFIBRA) 134 MG capsule, Take 134 mg by mouth daily before breakfast., Disp: , Rfl:     hydrochlorothiazide (HYDRODIURIL) 25 MG tablet, Take 25 mg by mouth daily., Disp: , Rfl:    lamoTRIgine  (LAMICTAL ) 25 MG tablet, TAKE 1 TABLET DAILY FOR 1 WEEK THEN TAKE 2 TABLETS DAILY (Patient taking differently: Take 50 mg by mouth daily.), Disp: 60 tablet, Rfl: 0   lisinopril (ZESTRIL) 5 MG tablet, Take 5 mg by mouth daily., Disp: , Rfl:    metFORMIN (GLUCOPHAGE-XR) 500 MG 24 hr tablet, Take 1 tablet by mouth daily with breakfast., Disp: , Rfl:    montelukast (SINGULAIR) 10 MG tablet, Take 10 mg by mouth at bedtime., Disp: , Rfl:    sertraline  (ZOLOFT ) 50 MG tablet, Take 50 mg by mouth daily., Disp: , Rfl:    albuterol  (VENTOLIN  HFA) 108 (90 Base) MCG/ACT inhaler, Inhale 2 puffs into the lungs every 6 (six) hours as needed (Cold). (Patient not taking: Reported on 03/11/2023), Disp: , Rfl:    AMBULATORY NON FORMULARY MEDICATION, Medication Name: Diltiazem 2% and Lidocaine  2% ointment.  Apply a pea sized amount per rectum three times daily X4 weeks or until it heals. (Patient not taking: Reported on 03/11/2023), Disp: 30 g, Rfl: 0   dexlansoprazole  (DEXILANT ) 60 MG capsule, TAKE ONE CAPSULE BY MOUTH DAILY, Disp: 90 capsule, Rfl: 3   diazepam  (VALIUM ) 5 MG tablet, Take 1 tablet (5 mg total) by mouth every 8 (eight) hours as needed for muscle spasms (difficulty urinating). (Patient not taking: Reported on 03/11/2023), Disp: 6 tablet, Rfl: 1   metFORMIN (GLUCOPHAGE) 500 MG tablet, Take 500 mg by mouth daily with breakfast. (Patient not taking: Reported on 03/11/2023), Disp: , Rfl:    oxyCODONE  (OXY IR/ROXICODONE ) 5 MG immediate release tablet, Take 1 tablet (5 mg total) by mouth every 6 (six) hours as needed for severe pain (pain score 7-10) or breakthrough pain. (Patient not taking: Reported on 03/11/2023), Disp: 30 tablet, Rfl: 0

## 2023-03-11 NOTE — Patient Instructions (Signed)
 We changed your humidity level from 4 to 2, and let's see if that helps keep the water chamber to last throughout the night.  Recommend using the CPAP machine for at least 4 hours every night.  Message me if you are still having trouble with the water chamber  Follow up in 3 months, call sooner if needed

## 2023-03-13 ENCOUNTER — Other Ambulatory Visit: Payer: Self-pay | Admitting: Gastroenterology

## 2023-03-22 ENCOUNTER — Encounter: Payer: Self-pay | Admitting: Pulmonary Disease

## 2023-04-16 ENCOUNTER — Telehealth: Payer: Self-pay | Admitting: Gastroenterology

## 2023-04-16 NOTE — Telephone Encounter (Signed)
 Inbound call from patients sister\m caregiver stating that patient is needing a refill for Dexilant. Sister stated that the pharmacy has sent over 2 refill request and has not heard anything back from our office, she also stated that he needs prior authorization. Patient is completely out of medication and is needing it as soon as possible. Please advise.

## 2023-04-17 NOTE — Telephone Encounter (Signed)
 Inbound call from patient's sister requesting a urgent call regarding previous message. States she does not want patient to have to go the emergency room due to being without medication. Please advise, thank you.

## 2023-04-17 NOTE — Telephone Encounter (Signed)
 Prior auth team, please advise as soon as possible.

## 2023-04-20 ENCOUNTER — Telehealth: Payer: Self-pay | Admitting: Pharmacy Technician

## 2023-04-20 ENCOUNTER — Other Ambulatory Visit (HOSPITAL_COMMUNITY): Payer: Self-pay

## 2023-04-20 NOTE — Telephone Encounter (Signed)
 Attempted to contact patient and left a voicemail to return call.

## 2023-04-20 NOTE — Telephone Encounter (Signed)
 Contacted patient's sister Mindi Junker and patient' sister is aware that we have been trying to reach out to the PA tea. Patient would like to speak with manager regarding the prior authorization on the Dexilant.

## 2023-04-20 NOTE — Telephone Encounter (Signed)
 Inbound call from patient's sister returning phone call. Please advise, thank you.

## 2023-04-20 NOTE — Telephone Encounter (Signed)
 Patients sister called stating that she has still not heard back about patients Dexilant. She is very frustrated because the pharmacy has faxed over multiple authorization request and not heard from anyone. Patients sister states he has been without his medication for 5 days and is gagging really bad and is needing medication. Patients sister is requesting a call from manager to discuss. Please advise.

## 2023-04-20 NOTE — Telephone Encounter (Signed)
 Pharmacy Patient Advocate Encounter   Received notification from Physician's Office that prior authorization for DEXLANSOPRAZOLE 60MG  is required/requested.   Insurance verification completed.   The patient is insured through [facility] .   Per test claim: PA required; PA submitted to above mentioned insurance via CoverMyMeds Key/confirmation #/EOC [id number] Status is pending

## 2023-04-20 NOTE — Telephone Encounter (Signed)
 Contacted patient's sister and patient's sister verbalized understanding that the PA was approved for Dexilant.

## 2023-04-20 NOTE — Telephone Encounter (Signed)
 Pharmacy Patient Advocate Encounter  Received notification from [facility] that Prior Authorization for DEXLANSOPRAZOLE 60MG  has been APPROVED from 1.1.25 to 3.17.26. Ran test claim, Copay is $1.60. This test claim was processed through [facility]- copay amounts may vary at other pharmacies due to pharmacy/plan contracts, or as the patient moves through the different stages of their insurance plan.   PA #/Case ID/Reference #:  [id number]

## 2023-04-21 NOTE — Telephone Encounter (Signed)
 Contacted patient's pharmacy and spoke with one of the staff members. Staff member is aware that the Dexilant medication PA was approved. Staff member stated that he would get the medication ready for the patient. Contacted patient's sister and made patient's sister aware that the pharmacy is getting the medication ready. Follow up appointment with Dr.Armbruster was scheduled for 07/14/23 at 10:30.

## 2023-04-22 NOTE — Telephone Encounter (Signed)
 The medication has been approved by the insurance.  I spoke with the patient's sister and the pharmacy .  The drug has been approved and ready for pick up. I apologized to her for the delay in obtaining the medication.

## 2023-05-12 ENCOUNTER — Ambulatory Visit: Payer: Self-pay | Admitting: Surgery

## 2023-06-10 ENCOUNTER — Ambulatory Visit: Payer: Medicare Other | Admitting: Pulmonary Disease

## 2023-07-07 ENCOUNTER — Ambulatory Visit: Payer: Self-pay | Admitting: Surgery

## 2023-07-14 ENCOUNTER — Encounter: Payer: Self-pay | Admitting: Gastroenterology

## 2023-07-14 ENCOUNTER — Ambulatory Visit (INDEPENDENT_AMBULATORY_CARE_PROVIDER_SITE_OTHER): Admitting: Gastroenterology

## 2023-07-14 VITALS — BP 126/74 | HR 85 | Ht 70.0 in | Wt 242.0 lb

## 2023-07-14 DIAGNOSIS — K219 Gastro-esophageal reflux disease without esophagitis: Secondary | ICD-10-CM

## 2023-07-14 DIAGNOSIS — K429 Umbilical hernia without obstruction or gangrene: Secondary | ICD-10-CM | POA: Diagnosis not present

## 2023-07-14 DIAGNOSIS — K760 Fatty (change of) liver, not elsewhere classified: Secondary | ICD-10-CM

## 2023-07-14 DIAGNOSIS — Z1509 Genetic susceptibility to other malignant neoplasm: Secondary | ICD-10-CM

## 2023-07-14 DIAGNOSIS — Z79899 Other long term (current) drug therapy: Secondary | ICD-10-CM

## 2023-07-14 NOTE — Patient Instructions (Signed)
 Continue Dexilant .  You will be due for an EGD and Colonoscopy in March 2026.  Thank you for entrusting me with your care and for choosing [facility], Dr. Alvester Johnson  If your blood pressure at your visit was 140/90 or greater, please contact your primary care physician to follow up on this. ______________________________________________________  If you are age 46 or older, your body mass index should be between 23-30. Your Body mass index is 34.72 kg/m. If this is out of the aforementioned range listed, please consider follow up with your Primary Care Provider.  If you are age 58 or younger, your body mass index should be between 19-25. Your Body mass index is 34.72 kg/m. If this is out of the aformentioned range listed, please consider follow up with your Primary Care Provider.  ________________________________________________________  The [facility] providers would like to encourage you to use MYCHART to communicate with providers for non-urgent requests or questions.  Due to long hold times on the telephone, sending your provider a message by [facility] may be a faster and more efficient way to get a response.  Please allow 48 business hours for a response.  Please remember that this is for non-urgent requests.  _______________________________________________________  Due to recent changes in healthcare laws, you may see the results of your imaging and laboratory studies on MyChart before your provider has had a chance to review them.  We understand that in some cases there may be results that are confusing or concerning to you. Not all laboratory results come back in the same time frame and the provider may be waiting for multiple results in order to interpret others.  Please give us  48 hours in order for your provider to thoroughly review all the results before contacting the office for clarification of your results.

## 2023-07-14 NOTE — Progress Notes (Signed)
 HPI :  48 year old male here for a follow-up visit for GERD, history of Lynch syndrome.  Recall he does have pH study proven reflux back in 2015 when done off PPI therapy.  He has had a normal esophageal manometry in the past.  He has failed Pepcid , omeprazole , Nexium in the past.  Therapy has been escalated to Dexilant  in recent years and he states he is doing really well on it.  He states it works "perfectly" if he takes it every day.  Denies any breakthrough on the regimen as long as he takes it 30 minutes prior to meals.  He is very happy with the regimen and wishes to continue.  He had an EGD for Lynch syndrome 2 years ago, no Barrett's or concerning pathology.  We discussed how frequently to do EGD, guidelines recommend every 2 to 4 years.  We also discussed how frequently to do colonoscopy.  He had his last colonoscopy in November without any high risk lesions.  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 Hemorrhoid ligation & pexy and hemorrhoidectomy. Excision of pedunculated thigh mass consistent with benign fibroepithelial polyp on 02/18/2023.  Pathology: Benign fibroepithelial polyp. Negative for malignancy. Anal fissure ulcer with acute inflammation squamous hyperplasia and marked hyperkeratosis.  He states he is doing well postoperatively and has not had any issues with rectal pain or bleeding.  He is taking Metamucil to keep his stools soft as needed.  Denies any problems with his bowels.  His main concern is currently an umbilical hernia which protrudes and bothers him from time to time.  He has requested surgical repair and is going to have that with Dr. Hershell Lose from what he says in the upcoming months, that has yet to be scheduled.  He is also been seen by hepatology at [facility] for fatty liver disease.  He had an elastography in July 2024 showing hepatic  steatosis but low risk for fibrosis.  He states his weight has been stable, working on weight loss.  Liver enzymes have been normal and monitored periodically     US  with elastography 08/2022 - Dawn Drazek: IMPRESSION: ULTRASOUND ABDOMEN:   Diffusely increased hepatic echogenicity can be seen in the setting of infiltrative process such as steatosis.   ULTRASOUND HEPATIC ELASTOGRAPHY:   Median kPa:  1.3   Diagnostic category:  < or = 5 kPa: high probability of being normal   PROCEDURES: COLONOSCOPY 12/24/2022: - Anal fissure found on perianal exam. - Soft tissue polypoid lesion near the scrotum unchanged since the last exam. Had recommended  surgical evaluation for removal at that time, that was not done. - One diminutive polyp in the sigmoid colon, removed with a cold snare. Resected and retrieved. - Internal hemorrhoids. - The examination was otherwise normal.  PATH: Surgical [P], colon, sigmoid, polyp (1) :  TUBULAR ADENOMA (1) WITHOUT HIGH GRADE DYSPLASIA.   EGD 05/27/2021: - Esophagogastric landmarks identified. - Normal esophagus otherwise - Benign stable appearing gastric lipoma. - Normal stomach otherwise  - biopsies taken to rule out H pylori  - Normal duodenal bulb and second portion of the  duodenum.  No erosive changes noted. Given patient's early satiety / nausea / vomiting, possible he has underlying gastroparesis. Consider empiric trial of Reglan  or GES, will discuss with family. Of note, given transient oxygen desaturations managed per anesthesia, recommend sleep study to rule out OSA  Past Medical History:  Diagnosis Date   Allergy    Anxiety and depression    Autism    Dysthymic disorder    Enlarged liver    Esophageal stricture    Esophagitis, unspecified    Fatty liver    GERD (gastroesophageal reflux disease)    Hepatic fibrosis    History of chronic cough    dry cough-sleeps with elevated head of bed.   Hx of colonic polyps    Hyperlipemia     Hypertension    Lynch syndrome    Metabolic dysfunction-associated steatotic liver disease (MASLD)    Pre-diabetes    Seasonal allergies      Past Surgical History:  Procedure Laterality Date   BRAVO PH STUDY N/A 03/28/2013   Procedure: BRAVO PH STUDY;  Surgeon: Claudette Cue, MD;  Location: [facility];  Service: Endoscopy;  Laterality: N/A;   COLONOSCOPY  2023   SA-MAC-plenvu (good)-TA-1-29yr recall   COLONSCOPY  02/04/2012   hx. polyps with past testing   DENTAL SURGERY     ESOPHAGEAL MANOMETRY N/A 04/25/2013   Procedure: ESOPHAGEAL MANOMETRY (EM);  Surgeon: Claudette Cue, MD;  Location: [facility];  Service: Endoscopy;  Laterality: N/A;   ESOPHAGOGASTRODUODENOSCOPY N/A 03/28/2013   Procedure: ESOPHAGOGASTRODUODENOSCOPY (EGD);  Surgeon: Claudette Cue, MD;  Location: [facility];  Service: Endoscopy;  Laterality: N/A;   FISTULOTOMY N/A 02/18/2023   Procedure: REPAIR OF PERIRECTAL FISTULA. LEFT LATERAL INTERNAL SPHINCTEROTOMY, FISTULOTOMY, HEMORRHOIDAL LIGATION/RECTOPEXY TIMES THREE, MARSUPIALIZATION;  Surgeon: Candyce Champagne, MD;  Location: [facility];  Service: General;  Laterality: N/A;   HERNIA REPAIR  AS INFANT   groin   MASS EXCISION N/A 02/18/2023   Procedure: REMOVAL OF MASS THIGH 5CM;  Surgeon: Candyce Champagne, MD;  Location: [facility];  Service: General;  Laterality: N/A;   RECTAL EXAM UNDER ANESTHESIA N/A 02/18/2023   Procedure: ANORECTAL EXAM UNDER ANESTHESIA;  Surgeon: Candyce Champagne, MD;  Location: [facility];  Service: General;  Laterality: N/A;   Family History  Problem Relation Age of Onset   Colon cancer Mother 22   Irritable bowel syndrome Mother    Colon polyps Mother 66   Diabetes Mother    Kidney cancer Mother 51   Kidney disease Father    Congestive Heart Failure Father    Dementia Father    Kidney failure Father    Colon polyps Brother 53   Colon cancer Brother 82   Liver cancer Brother    Ovarian cancer Maternal Aunt    Liver cancer Maternal Aunt     Stomach cancer Maternal Aunt 72   Brain cancer Maternal Aunt    Colon polyps Maternal Grandfather 67   Colon cancer Maternal Grandfather 67   Esophageal cancer Maternal Grandfather 72   Rectal cancer Neg Hx    Social History   Tobacco Use   Smoking status: Never    Passive exposure: Never   Smokeless tobacco: Never  Vaping Use   Vaping status: Never Used  Substance Use Topics   Alcohol use: No   Drug use: No   Current Outpatient Medications  Medication Sig Dispense Refill   ARIPiprazole  (ABILIFY ) 5 MG tablet TAKE ONE (1) TABLET EACH DAY 30 tablet 0   atorvastatin (LIPITOR) 10 MG tablet Take 10 mg by mouth every evening.      cetirizine (ZYRTEC) 10 MG tablet Take 10 mg by mouth daily.     dexlansoprazole  (DEXILANT ) 60 MG capsule TAKE ONE CAPSULE BY MOUTH DAILY 90 capsule 3  fenofibrate micronized (LOFIBRA) 134 MG capsule Take 134 mg by mouth daily before breakfast.     hydrochlorothiazide (HYDRODIURIL) 25 MG tablet Take 25 mg by mouth daily.     lamoTRIgine  (LAMICTAL ) 25 MG tablet TAKE 1 TABLET DAILY FOR 1 WEEK THEN TAKE 2 TABLETS DAILY (Patient taking differently: Take 50 mg by mouth daily.) 60 tablet 0   lisinopril (ZESTRIL) 5 MG tablet Take 5 mg by mouth daily.     metFORMIN (GLUCOPHAGE-XR) 500 MG 24 hr tablet Take 1 tablet by mouth daily with breakfast.     montelukast (SINGULAIR) 10 MG tablet Take 10 mg by mouth at bedtime.     oxyCODONE  (OXY IR/ROXICODONE ) 5 MG immediate release tablet Take 1 tablet (5 mg total) by mouth every 6 (six) hours as needed for severe pain (pain score 7-10) or breakthrough pain. 30 tablet 0   sertraline  (ZOLOFT ) 50 MG tablet Take 50 mg by mouth daily.     No current facility-administered medications for this visit.   Allergies  Allergen Reactions   Cefaclor Shortness Of Breath and Swelling   Dimetapp Dm Cold-Cough [Phenylephrine-Bromphen-Dm] Shortness Of Breath and Swelling   Furosemide Rash   Reglan  [Metoclopramide ] Anxiety and Other (See  Comments)    Weight loss/mental breakdown     Review of Systems: All systems reviewed and negative except where noted in HPI.   Lab Results  Component Value Date   WBC 9.2 02/06/2023   HGB 15.3 02/06/2023   HCT 44.9 02/06/2023   MCV 87.9 02/06/2023   PLT 315 02/06/2023    Lab Results  Component Value Date   NA 137 02/06/2023   CL 104 02/06/2023   K 4.1 02/06/2023   CO2 25 02/06/2023   BUN 21 (H) 02/06/2023   CREATININE 0.78 02/06/2023   GFRNONAA >60 02/06/2023   CALCIUM 9.3 02/06/2023   ALBUMIN 4.1 02/06/2023   GLUCOSE 97 02/06/2023    Lab Results  Component Value Date   ALT 34 02/06/2023   AST 22 02/06/2023   ALKPHOS 46 02/06/2023   BILITOT 0.7 02/06/2023     Physical Exam: BP 126/74   Pulse 85   Ht 5\' 10"  (1.778 m)   Wt 242 lb (109.8 kg)   BMI 34.72 kg/m  Constitutional: Pleasant,well-developed, male in no acute distress. Neurological: Alert and oriented to person place and time. Psychiatric: Normal mood and affect. Behavior is normal.   Lab Results  Component Value Date   ALT 34 02/06/2023   AST 22 02/06/2023   ALKPHOS 46 02/06/2023   BILITOT 0.7 02/06/2023    Fibrosis 4 Score = .54 (Low risk)        Interpretation for patients with NAFLD          <1.30       -  F0-F1 (Low risk)          1.30-2.67 -  Indeterminate           >2.67      -  F3-F4 (High risk)     Validated for ages 54-65         ASSESSMENT: 48 y.o. male here for assessment of the following  1. Gastroesophageal reflux disease, unspecified whether esophagitis present   2. Long-term current use of proton pump inhibitor therapy   3. Lynch syndrome   4. Metabolic dysfunction-associated steatotic liver disease (MASLD)   5. Periumbilical hernia    No Barrett's, longstanding GERD on chronic PPI.  He has failed Pepcid , omeprazole , Nexium, led  to escalation of therapy with Dexilant  and fortunately has responded quite well to that.  Understands PPI use, long-term risks.  He wishes to  continue with therapy for now.  We reviewed Lynch syndrome and we discussed how often to do EGD and colonoscopy.  EGD typically every 2 to 4 years, colonoscopy every 1 to 2 years.  Would like to pare them together for next procedure to only have him sedated once for both.  In looking at his schedule and discussing options he wishes to pursue both around March 2026 per his preference, that will be slightly over a year from his last colonoscopy in 2 and half to 3 years since his last EGD.  He is comfortable with this.  Otherwise, fib 4 score low, elastography negative, ALT normal.  Counseled on fatty liver, he will see us  once yearly for this, work on weight loss.  Encouraged routine coffee intake.  He will otherwise see Dr. Hershell Lose for periumbilical hernia repair later this year.   PLAN: - recall EGD and colon March 2026 per his preference in regards to discussion about timing - continue Dexilant  - watch weight, fatty liver stable, trend LAEs which are recently normal - continue to follow up with surgery for periumbilical hernia repair. Did well post repair of fistula  Christi Coward, MD [facility]

## 2023-07-30 NOTE — Patient Instructions (Addendum)
 SURGICAL WAITING ROOM VISITATION Patients having surgery or a procedure may have no more than 2 support people in the waiting area - these visitors may rotate.    Children under the age of 45 must have an adult with them who is not the patient.  If the patient needs to stay at the hospital during part of their recovery, the visitor guidelines for inpatient rooms apply. Pre-op nurse will coordinate an appropriate time for 1 support person to accompany patient in pre-op.  This support person may not rotate.    Please refer to the [facility] website for the visitor guidelines for Inpatients (after your surgery is over and you are in a regular room).       Your procedure is scheduled on: 08-14-23   Report to [facility] Main Entrance    Report to admitting at 11:45 AM   Call this number if you have problems the morning of surgery [phone]   Do not eat food :After Midnight.   After Midnight you may have the following liquids until 11:00 AM DAY OF SURGERY  Water Non-Citrus Juices (without pulp, NO RED-Apple, White grape, White cranberry) Black Coffee (NO MILK/CREAM OR CREAMERS, sugar ok)  Clear Tea (NO MILK/CREAM OR CREAMERS, sugar ok) regular and decaf                             Plain Jell-O (NO RED)                                           Fruit ices (not with fruit pulp, NO RED)                                     Popsicles (NO RED)                                                               Sports drinks like Gatorade (NO RED)                   The day of surgery:  Drink ONE (1) Pre-Surgery G2 by 11:00 AM the morning of surgery. Drink in one sitting. Do not sip.  This drink was given to you during your hospital  pre-op appointment visit. Nothing else to drink after completing the Pre-Surgery G2.          If you have questions, please contact your surgeon's office.   FOLLOW BOWEL PREP AND ANY ADDITIONAL PRE OP INSTRUCTIONS YOU RECEIVED FROM YOUR SURGEON'S  OFFICE!!!     Oral Hygiene is also important to reduce your risk of infection.                                    Remember - BRUSH YOUR TEETH THE MORNING OF SURGERY WITH YOUR REGULAR TOOTHPASTE   Do NOT smoke after Midnight   Take these medicines the morning of surgery with A SIP OF WATER:    Aripiprazole    Zyrtec  Dexlansoprazole    Fenofibrate   Lamotrigine    Sertraline    Zyrtec   Oxycodone  if needed  Stop all vitamins and herbal supplements 7 days before surgery  How to Manage Your Diabetes Before and After Surgery  Why is it important to control my blood sugar before and after surgery? Improving blood sugar levels before and after surgery helps healing and can limit problems. A way of improving blood sugar control is eating a healthy diet by:  Eating less sugar and carbohydrates  Increasing activity/exercise  Talking with your doctor about reaching your blood sugar goals High blood sugars (greater than 180 mg/dL) can raise your risk of infections and slow your recovery, so you will need to focus on controlling your diabetes during the weeks before surgery. Make sure that the doctor who takes care of your diabetes knows about your planned surgery including the date and location.  How do I manage my blood sugar before surgery? Check your blood sugar at least 4 times a day, starting 2 days before surgery, to make sure that the level is not too high or low. Check your blood sugar the morning of your surgery when you wake up and every 2 hours until you get to the Short Stay unit. If your blood sugar is less than 70 mg/dL, you will need to treat for low blood sugar: Do not take insulin . Treat a low blood sugar (less than 70 mg/dL) with  cup of clear juice (cranberry or apple), 4 glucose tablets, OR glucose gel. Recheck blood sugar in 15 minutes after treatment (to make sure it is greater than 70 mg/dL). If your blood sugar is not greater than 70 mg/dL on recheck, call  [phone] for further instructions. Report your blood sugar to the short stay nurse when you get to Short Stay.  If you are admitted to the hospital after surgery: Your blood sugar will be checked by the staff and you will probably be given insulin  after surgery (instead of oral diabetes medicines) to make sure you have good blood sugar levels. The goal for blood sugar control after surgery is 80-180 mg/dL.   WHAT DO I DO ABOUT MY DIABETES MEDICATION?  Do not take oral diabetes medicines (pills) the morning of surgery. (Do not take Metformin the morning of surgery)  DO NOT TAKE THE FOLLOWING 7 DAYS PRIOR TO SURGERY: Ozempic, Wegovy, Rybelsus (Semaglutide), Byetta (exenatide), Bydureon (exenatide ER), Victoza, Saxenda (liraglutide), or Trulicity (dulaglutide) Mounjaro (Tirzepatide) Adlyxin (Lixisenatide), Polyethylene Glycol Loxenatide.  Reviewed and Endorsed by [facility] Patient Education Committee, August 2015  Bring CPAP mask and tubing day of surgery.                              You may not have any metal on your body including  jewelry, and body piercing             Do not wear lotions, powders, cologne, or deodorant              Men may shave face and neck.   Do not bring valuables to the hospital. [facility] IS NOT RESPONSIBLE   FOR VALUABLES.   Contacts, dentures or bridgework may not be worn into surgery.  DO NOT BRING YOUR HOME MEDICATIONS TO THE HOSPITAL. PHARMACY WILL DISPENSE MEDICATIONS LISTED ON YOUR MEDICATION LIST TO YOU DURING YOUR ADMISSION IN THE HOSPITAL!    Patients discharged on the day of surgery will not  be allowed to drive home.  Someone NEEDS to stay with you for the first 24 hours after anesthesia.   Special Instructions: Bring a copy of your healthcare power of attorney and living will documents the day of surgery if you haven't scanned them before.              Please read over the following fact sheets you were given: IF YOU HAVE QUESTIONS ABOUT  YOUR PRE-OP INSTRUCTIONS PLEASE CALL [phone] Gwen  If you received a COVID test during your pre-op visit  it is requested that you wear a mask when out in public, stay away from anyone that may not be feeling well and notify your surgeon if you develop symptoms. If you test positive for Covid or have been in contact with anyone that has tested positive in the last 10 days please notify you surgeon.  [facility] - Preparing for Surgery Before surgery, you can play an important role.  Because skin is not sterile, your skin needs to be as free of germs as possible.  You can reduce the number of germs on your skin by washing with CHG (chlorahexidine gluconate) soap before surgery.  CHG is an antiseptic cleaner which kills germs and bonds with the skin to continue killing germs even after washing. Please DO NOT use if you have an allergy to CHG or antibacterial soaps.  If your skin becomes reddened/irritated stop using the CHG and inform your nurse when you arrive at Short Stay. Do not shave (including legs and underarms) for at least 48 hours prior to the first CHG shower.  You may shave your face/neck.  Please follow these instructions carefully:  1.  Shower with CHG Soap the night before surgery and the  morning of surgery.  2.  If you choose to wash your hair, wash your hair first as usual with your normal  shampoo.  3.  After you shampoo, rinse your hair and body thoroughly to remove the shampoo.                             4.  Use CHG as you would any other liquid soap.  You can apply chg directly to the skin and wash.  Gently with a scrungie or clean washcloth.  5.  Apply the CHG Soap to your body ONLY FROM THE NECK DOWN.   Do   not use on face/ open                           Wound or open sores. Avoid contact with eyes, ears mouth and   genitals (private parts).                       Wash face,  Genitals (private parts) with your normal soap.             6.  Wash thoroughly, paying special  attention to the area where your    surgery  will be performed.  7.  Thoroughly rinse your body with warm water from the neck down.  8.  DO NOT shower/wash with your normal soap after using and rinsing off the CHG Soap.                9.  Pat yourself dry with a clean towel.            10.  Wear clean pajamas.            11.  Place clean sheets on your bed the night of your first shower and do not  sleep with pets. Day of Surgery : Do not apply any lotions/deodorants the morning of surgery.  Please wear clean clothes to the hospital/surgery center.  FAILURE TO FOLLOW THESE INSTRUCTIONS MAY RESULT IN THE CANCELLATION OF YOUR SURGERY  PATIENT SIGNATURE_________________________________  NURSE SIGNATURE__________________________________  ________________________________________________________________________

## 2023-07-30 NOTE — Progress Notes (Addendum)
  Date of COVID positive in last 90 days:  PCP - Elberta Cone, FNP Cardiologist - N/A Pulmonologist - Dorn Chill, MD  Chest x-ray - N/A EKG - 02-06-23 Epic Stress Test - N/A ECHO - 06-06-21 Epic Cardiac Cath - N/A Pacemaker/ICD device last checked: Spinal Cord Stimulator:N/A  Bowel Prep - N/A  Sleep Study - Yes, +sleep apnea CPAP - Yes  Fasting Blood Sugar - 115 or lower  Checks Blood Sugar -  checks weekly  Last dose of GLP1 agonist-  N/A GLP1 instructions:  Do not take after     Last dose of SGLT-2 inhibitors-  N/A SGLT-2 instructions:  Do not take after    Blood Thinner Instructions: N/A Aspirin Instructions: Last Dose:  Activity level:  Can go up a flight of stairs and perform activities of daily living without stopping and without symptoms of chest pain or shortness of breath.  Anesthesia review: N/A  Patient denies shortness of breath, fever, cough and chest pain at PAT appointment  Patient verbalized understanding of instructions that were given to them at the PAT appointment. Patient was also instructed that they will need to review over the PAT instructions again at home before surgery.

## 2023-08-03 ENCOUNTER — Other Ambulatory Visit: Payer: Self-pay

## 2023-08-03 ENCOUNTER — Encounter (HOSPITAL_COMMUNITY): Payer: Self-pay

## 2023-08-03 ENCOUNTER — Encounter (HOSPITAL_COMMUNITY)
Admission: RE | Admit: 2023-08-03 | Discharge: 2023-08-03 | Disposition: A | Discharge status: Home / Self Care | Source: Ambulatory Visit | Attending: Surgery | Admitting: Surgery

## 2023-08-03 VITALS — BP 123/81 | HR 57 | Temp 98.4°F | Resp 18 | Ht 70.0 in | Wt 240.2 lb

## 2023-08-03 DIAGNOSIS — Z01810 Encounter for preprocedural cardiovascular examination: Secondary | ICD-10-CM | POA: Diagnosis present

## 2023-08-03 DIAGNOSIS — E119 Type 2 diabetes mellitus without complications: Secondary | ICD-10-CM | POA: Insufficient documentation

## 2023-08-03 DIAGNOSIS — Z01818 Encounter for other preprocedural examination: Secondary | ICD-10-CM | POA: Insufficient documentation

## 2023-08-03 DIAGNOSIS — R9431 Abnormal electrocardiogram [ECG] [EKG]: Secondary | ICD-10-CM | POA: Diagnosis not present

## 2023-08-03 DIAGNOSIS — Z01812 Encounter for preprocedural laboratory examination: Secondary | ICD-10-CM | POA: Diagnosis present

## 2023-08-03 LAB — CBC
HCT: 45 % (ref 39.0–52.0)
Hemoglobin: 15.1 g/dL (ref 13.0–17.0)
MCH: 29.2 pg (ref 26.0–34.0)
MCHC: 33.6 g/dL (ref 30.0–36.0)
MCV: 87 fL (ref 80.0–100.0)
Platelets: 302 10*3/uL (ref 150–400)
RBC: 5.17 MIL/uL (ref 4.22–5.81)
RDW: 12.4 % (ref 11.5–15.5)
WBC: 6.9 10*3/uL (ref 4.0–10.5)
nRBC: 0 % (ref 0.0–0.2)

## 2023-08-03 LAB — BASIC METABOLIC PANEL WITH GFR
Anion gap: 9 (ref 5–15)
BUN: 16 mg/dL (ref 6–20)
CO2: 26 mmol/L (ref 22–32)
Calcium: 9.4 mg/dL (ref 8.9–10.3)
Chloride: 102 mmol/L (ref 98–111)
Creatinine, Ser: 0.77 mg/dL (ref 0.61–1.24)
GFR, Estimated: >60 mL/min (ref >60)
Glucose, Bld: 123 mg/dL — ABNORMAL HIGH (ref 70–99)
Potassium: 3.9 mmol/L (ref 3.5–5.1)
Sodium: 137 mmol/L (ref 135–145)

## 2023-08-03 LAB — HEMOGLOBIN A1C
Hgb A1c MFr Bld: 6.1 % — ABNORMAL HIGH (ref 4.8–5.6)
Mean Plasma Glucose: 128.37 mg/dL

## 2023-08-03 LAB — GLUCOSE, CAPILLARY: Glucose-Capillary: 138 mg/dL — ABNORMAL HIGH (ref 70–99)

## 2023-08-13 MED ORDER — GENTAMICIN SULFATE 40 MG/ML IJ SOLN
5.0000 mg/kg | INTRAVENOUS | Status: DC
  Filled 2023-08-13: qty 11

## 2023-08-13 MED ORDER — CLINDAMYCIN PHOSPHATE 900 MG/50ML IV SOLN
900.0000 mg | INTRAVENOUS | Status: DC
  Filled 2023-08-13: qty 50

## 2023-08-14 ENCOUNTER — Other Ambulatory Visit: Payer: Self-pay

## 2023-08-14 ENCOUNTER — Ambulatory Visit (HOSPITAL_COMMUNITY): Payer: Self-pay | Admitting: Anesthesiology

## 2023-08-14 ENCOUNTER — Encounter (HOSPITAL_COMMUNITY): Payer: Self-pay | Admitting: Surgery

## 2023-08-14 ENCOUNTER — Ambulatory Visit (HOSPITAL_BASED_OUTPATIENT_CLINIC_OR_DEPARTMENT_OTHER): Payer: Self-pay | Admitting: Anesthesiology

## 2023-08-14 ENCOUNTER — Ambulatory Visit (HOSPITAL_COMMUNITY)
Admission: RE | Admit: 2023-08-14 | Discharge: 2023-08-14 | Disposition: A | Discharge status: Home / Self Care | Attending: Surgery | Admitting: Surgery

## 2023-08-14 ENCOUNTER — Encounter (HOSPITAL_COMMUNITY)
Admission: RE | Disposition: A | Discharge status: Home / Self Care | Payer: Self-pay | Source: Home / Self Care | Attending: Surgery

## 2023-08-14 DIAGNOSIS — K603 Anal fistula, unspecified: Secondary | ICD-10-CM | POA: Insufficient documentation

## 2023-08-14 DIAGNOSIS — Z79899 Other long term (current) drug therapy: Secondary | ICD-10-CM | POA: Insufficient documentation

## 2023-08-14 DIAGNOSIS — G4733 Obstructive sleep apnea (adult) (pediatric): Secondary | ICD-10-CM

## 2023-08-14 DIAGNOSIS — I10 Essential (primary) hypertension: Secondary | ICD-10-CM | POA: Insufficient documentation

## 2023-08-14 DIAGNOSIS — E119 Type 2 diabetes mellitus without complications: Secondary | ICD-10-CM | POA: Insufficient documentation

## 2023-08-14 DIAGNOSIS — Z7984 Long term (current) use of oral hypoglycemic drugs: Secondary | ICD-10-CM | POA: Insufficient documentation

## 2023-08-14 DIAGNOSIS — K219 Gastro-esophageal reflux disease without esophagitis: Secondary | ICD-10-CM | POA: Diagnosis not present

## 2023-08-14 DIAGNOSIS — F840 Autistic disorder: Secondary | ICD-10-CM | POA: Diagnosis not present

## 2023-08-14 DIAGNOSIS — K450 Other specified abdominal hernia with obstruction, without gangrene: Secondary | ICD-10-CM

## 2023-08-14 DIAGNOSIS — F418 Other specified anxiety disorders: Secondary | ICD-10-CM

## 2023-08-14 DIAGNOSIS — E782 Mixed hyperlipidemia: Secondary | ICD-10-CM | POA: Diagnosis not present

## 2023-08-14 DIAGNOSIS — K429 Umbilical hernia without obstruction or gangrene: Secondary | ICD-10-CM | POA: Diagnosis not present

## 2023-08-14 HISTORY — PX: VENTRAL HERNIA REPAIR: SHX424

## 2023-08-14 LAB — GLUCOSE, CAPILLARY
Glucose-Capillary: 91 mg/dL (ref 70–99)
Glucose-Capillary: 104 mg/dL — ABNORMAL HIGH (ref 70–99)
Glucose-Capillary: 147 mg/dL — ABNORMAL HIGH (ref 70–99)

## 2023-08-14 SURGERY — REPAIR, HERNIA, VENTRAL, LAPAROSCOPIC
Anesthesia: General | Site: Abdomen

## 2023-08-14 MED ORDER — PROPOFOL 10 MG/ML IV BOLUS
INTRAVENOUS | Status: AC
  Filled 2023-08-14: qty 20

## 2023-08-14 MED ORDER — SUCCINYLCHOLINE CHLORIDE 200 MG/10ML IV SOSY
PREFILLED_SYRINGE | INTRAVENOUS | Status: AC
  Filled 2023-08-14: qty 10

## 2023-08-14 MED ORDER — 0.9 % SODIUM CHLORIDE (POUR BTL) OPTIME
TOPICAL | Status: DC | PRN
  Administered 2023-08-14: 1000 mL

## 2023-08-14 MED ORDER — METHOCARBAMOL 500 MG PO TABS: 500.0000 mg | ORAL_TABLET | Freq: Three times a day (TID) | ORAL | 1 refills | Status: AC | PRN

## 2023-08-14 MED ORDER — DEXMEDETOMIDINE HCL IN NACL 80 MCG/20ML IV SOLN
INTRAVENOUS | Status: AC
  Filled 2023-08-14: qty 20

## 2023-08-14 MED ORDER — FENTANYL CITRATE PF 50 MCG/ML IJ SOSY
25.0000 ug | PREFILLED_SYRINGE | INTRAMUSCULAR | Status: DC | PRN
  Administered 2023-08-14 (×2): 50 ug via INTRAVENOUS

## 2023-08-14 MED ORDER — DEXAMETHASONE SODIUM PHOSPHATE 10 MG/ML IJ SOLN
INTRAMUSCULAR | Status: AC
  Filled 2023-08-14: qty 1

## 2023-08-14 MED ORDER — PHENYLEPHRINE 80 MCG/ML (10ML) SYRINGE FOR IV PUSH (FOR BLOOD PRESSURE SUPPORT)
PREFILLED_SYRINGE | INTRAVENOUS | Status: AC
  Filled 2023-08-14: qty 10

## 2023-08-14 MED ORDER — FENTANYL CITRATE PF 50 MCG/ML IJ SOSY
PREFILLED_SYRINGE | INTRAMUSCULAR | Status: AC
  Filled 2023-08-14: qty 1

## 2023-08-14 MED ORDER — CHLORHEXIDINE GLUCONATE CLOTH 2 % EX PADS: 6.0000 | MEDICATED_PAD | Freq: Once | CUTANEOUS | Status: DC

## 2023-08-14 MED ORDER — OXYCODONE HCL 5 MG PO TABS: 5.0000 mg | ORAL_TABLET | Freq: Four times a day (QID) | ORAL | 0 refills | Status: DC | PRN

## 2023-08-14 MED ORDER — BUPIVACAINE LIPOSOME 1.3 % IJ SUSP
INTRAMUSCULAR | Status: DC | PRN
  Administered 2023-08-14: 80 mL

## 2023-08-14 MED ORDER — ROCURONIUM BROMIDE 10 MG/ML (PF) SYRINGE
PREFILLED_SYRINGE | INTRAVENOUS | Status: AC
  Filled 2023-08-14: qty 10

## 2023-08-14 MED ORDER — ACETAMINOPHEN 500 MG PO TABS
1000.0000 mg | ORAL_TABLET | ORAL | Status: AC
  Administered 2023-08-14: 1000 mg via ORAL
  Filled 2023-08-14: qty 2

## 2023-08-14 MED ORDER — MIDAZOLAM HCL 2 MG/2ML IJ SOLN
INTRAMUSCULAR | Status: DC | PRN
  Administered 2023-08-14: 2 mg via INTRAVENOUS

## 2023-08-14 MED ORDER — OXYCODONE HCL 5 MG PO TABS
ORAL_TABLET | ORAL | Status: AC
  Filled 2023-08-14: qty 1

## 2023-08-14 MED ORDER — CHLORHEXIDINE GLUCONATE 0.12 % MT SOLN
15.0000 mL | Freq: Once | OROMUCOSAL | Status: AC
  Administered 2023-08-14: 15 mL via OROMUCOSAL

## 2023-08-14 MED ORDER — PROPOFOL 500 MG/50ML IV EMUL
INTRAVENOUS | Status: DC | PRN
  Administered 2023-08-14: 200 mg via INTRAVENOUS

## 2023-08-14 MED ORDER — CLINDAMYCIN PHOSPHATE 900 MG/50ML IV SOLN
INTRAVENOUS | Status: DC | PRN
  Administered 2023-08-14: 900 mg via INTRAVENOUS

## 2023-08-14 MED ORDER — OXYCODONE HCL 5 MG/5ML PO SOLN: 5.0000 mg | Freq: Once | ORAL | Status: AC | PRN

## 2023-08-14 MED ORDER — BUPIVACAINE LIPOSOME 1.3 % IJ SUSP
INTRAMUSCULAR | Status: AC
  Filled 2023-08-14: qty 20

## 2023-08-14 MED ORDER — AMISULPRIDE (ANTIEMETIC) 5 MG/2ML IV SOLN: 10.0000 mg | Freq: Once | INTRAVENOUS | Status: DC | PRN

## 2023-08-14 MED ORDER — METHOCARBAMOL 500 MG PO TABS
500.0000 mg | ORAL_TABLET | Freq: Three times a day (TID) | ORAL | 1 refills | Status: DC | PRN
Start: 2023-08-14 — End: 2023-08-14

## 2023-08-14 MED ORDER — OXYCODONE HCL 5 MG PO TABS: 5.0000 mg | ORAL_TABLET | Freq: Four times a day (QID) | ORAL | 0 refills | Status: AC | PRN

## 2023-08-14 MED ORDER — LACTATED RINGERS IV SOLN: INTRAVENOUS | Status: DC | PRN

## 2023-08-14 MED ORDER — DEXMEDETOMIDINE HCL IN NACL 80 MCG/20ML IV SOLN
INTRAVENOUS | Status: DC | PRN
  Administered 2023-08-14 (×4): 4 ug via INTRAVENOUS

## 2023-08-14 MED ORDER — ONDANSETRON HCL 4 MG/2ML IJ SOLN
INTRAMUSCULAR | Status: AC
Start: 2023-08-14 — End: 2023-08-14
  Filled 2023-08-14: qty 2

## 2023-08-14 MED ORDER — MIDAZOLAM HCL 2 MG/2ML IJ SOLN
INTRAMUSCULAR | Status: AC
  Filled 2023-08-14: qty 2

## 2023-08-14 MED ORDER — BUPIVACAINE-EPINEPHRINE (PF) 0.25% -1:200000 IJ SOLN
INTRAMUSCULAR | Status: AC
  Filled 2023-08-14: qty 60

## 2023-08-14 MED ORDER — FENTANYL CITRATE (PF) 100 MCG/2ML IJ SOLN
INTRAMUSCULAR | Status: DC | PRN
  Administered 2023-08-14: 50 ug via INTRAVENOUS
  Administered 2023-08-14: 100 ug via INTRAVENOUS

## 2023-08-14 MED ORDER — INSULIN ASPART 100 UNIT/ML IJ SOLN: 0.0000 [IU] | INTRAMUSCULAR | Status: DC | PRN

## 2023-08-14 MED ORDER — LIDOCAINE HCL (PF) 2 % IJ SOLN
INTRAMUSCULAR | Status: AC
Start: 2023-08-14 — End: 2023-08-14
  Filled 2023-08-14: qty 10

## 2023-08-14 MED ORDER — OXYCODONE HCL 5 MG PO TABS
5.0000 mg | ORAL_TABLET | Freq: Once | ORAL | Status: AC | PRN
  Administered 2023-08-14: 5 mg via ORAL

## 2023-08-14 MED ORDER — ONDANSETRON HCL 4 MG/2ML IJ SOLN
INTRAMUSCULAR | Status: DC | PRN
  Administered 2023-08-14: 4 mg via INTRAVENOUS

## 2023-08-14 MED ORDER — ROCURONIUM BROMIDE 100 MG/10ML IV SOLN
INTRAVENOUS | Status: DC | PRN
  Administered 2023-08-14: 55 mg via INTRAVENOUS
  Administered 2023-08-14: 5 mg via INTRAVENOUS

## 2023-08-14 MED ORDER — BUPIVACAINE LIPOSOME 1.3 % IJ SUSP: 20.0000 mL | Freq: Once | INTRAMUSCULAR | Status: DC

## 2023-08-14 MED ORDER — SUCCINYLCHOLINE CHLORIDE 200 MG/10ML IV SOSY
PREFILLED_SYRINGE | INTRAVENOUS | Status: DC | PRN
  Administered 2023-08-14: 120 mg via INTRAVENOUS

## 2023-08-14 MED ORDER — ORAL CARE MOUTH RINSE: 15.0000 mL | Freq: Once | OROMUCOSAL | Status: AC

## 2023-08-14 MED ORDER — SUGAMMADEX SODIUM 200 MG/2ML IV SOLN
INTRAVENOUS | Status: DC | PRN
  Administered 2023-08-14: 300 mg via INTRAVENOUS

## 2023-08-14 MED ORDER — LIDOCAINE HCL (PF) 2 % IJ SOLN
INTRAMUSCULAR | Status: AC
  Filled 2023-08-14: qty 5

## 2023-08-14 MED ORDER — FENTANYL CITRATE (PF) 100 MCG/2ML IJ SOLN
INTRAMUSCULAR | Status: AC
  Filled 2023-08-14: qty 2

## 2023-08-14 MED ORDER — DEXAMETHASONE SODIUM PHOSPHATE 10 MG/ML IJ SOLN
INTRAMUSCULAR | Status: DC | PRN
  Administered 2023-08-14: 8 mg via INTRAVENOUS

## 2023-08-14 MED ORDER — LACTATED RINGERS IV SOLN: INTRAVENOUS | Status: DC

## 2023-08-14 MED ORDER — ENSURE PRE-SURGERY PO LIQD: 296.0000 mL | Freq: Once | ORAL | Status: DC

## 2023-08-14 MED ORDER — ENSURE PRE-SURGERY PO LIQD: 592.0000 mL | Freq: Once | ORAL | Status: DC

## 2023-08-14 MED ORDER — GABAPENTIN 300 MG PO CAPS
300.0000 mg | ORAL_CAPSULE | ORAL | Status: AC
  Administered 2023-08-14: 300 mg via ORAL
  Filled 2023-08-14: qty 1

## 2023-08-14 SURGICAL SUPPLY — 45 items
BAG COUNTER SPONGE SURGICOUNT (BAG) ×2 IMPLANT
BINDER ABDOMINAL 12 ML 46-62 (SOFTGOODS) IMPLANT
CABLE HIGH FREQUENCY MONO STRZ (ELECTRODE) ×2 IMPLANT
CHLORAPREP W/TINT 26 (MISCELLANEOUS) ×2 IMPLANT
CLIP APPLIE 5 13 M/L LIGAMAX5 (MISCELLANEOUS) IMPLANT
COVER SURGICAL LIGHT HANDLE (MISCELLANEOUS) ×2 IMPLANT
DEVICE SECURE STRAP 25 ABSORB (INSTRUMENTS) ×2 IMPLANT
DEVICE TROCAR PUNCTURE CLOSURE (ENDOMECHANICALS) ×2 IMPLANT
DRAIN CHANNEL 19F RND (DRAIN) IMPLANT
DRAPE INCISE IOBAN 66X45 STRL (DRAPES) IMPLANT
DRAPE WARM FLUID 44X44 (DRAPES) ×2 IMPLANT
DRSG TEGADERM 2-3/8X2-3/4 SM (GAUZE/BANDAGES/DRESSINGS) ×6 IMPLANT
DRSG TEGADERM 4X4.75 (GAUZE/BANDAGES/DRESSINGS) ×2 IMPLANT
ELECT REM PT RETURN 15FT ADLT (MISCELLANEOUS) ×2 IMPLANT
EVACUATOR SILICONE 100CC (DRAIN) IMPLANT
GAUZE SPONGE 2X2 8PLY STRL LF (GAUZE/BANDAGES/DRESSINGS) IMPLANT
GLOVE ECLIPSE 8.0 STRL XLNG CF (GLOVE) ×2 IMPLANT
GLOVE INDICATOR 8.0 STRL GRN (GLOVE) ×2 IMPLANT
GOWN STRL REUS W/ TWL XL LVL3 (GOWN DISPOSABLE) ×2 IMPLANT
IRRIGATION SUCT STRKRFLW 2 WTP (MISCELLANEOUS) IMPLANT
KIT BASIN OR (CUSTOM PROCEDURE TRAY) ×2 IMPLANT
KIT TURNOVER KIT A (KITS) ×2 IMPLANT
MARKER SKIN DUAL TIP RULER LAB (MISCELLANEOUS) ×2 IMPLANT
MESH VENTRALIGHT ST 7X9N (Mesh General) IMPLANT
NDL INSUFFLATION 14GA 120MM (NEEDLE) IMPLANT
NDL SPNL 22GX3.5 QUINCKE BK (NEEDLE) IMPLANT
NEEDLE INSUFFLATION 14GA 120MM (NEEDLE) IMPLANT
NEEDLE SPNL 22GX3.5 QUINCKE BK (NEEDLE) IMPLANT
PAD POSITIONING PINK XL (MISCELLANEOUS) ×2 IMPLANT
SCISSORS LAP 5X35 DISP (ENDOMECHANICALS) ×2 IMPLANT
SET TUBE SMOKE EVAC HIGH FLOW (TUBING) ×2 IMPLANT
SLEEVE ADV FIXATION 5X100MM (TROCAR) ×2 IMPLANT
SPIKE FLUID TRANSFER (MISCELLANEOUS) ×2 IMPLANT
STRIP CLOSURE SKIN 1/2X4 (GAUZE/BANDAGES/DRESSINGS) ×4 IMPLANT
SUT MNCRL AB 4-0 PS2 18 (SUTURE) ×2 IMPLANT
SUT PDS AB 1 CT1 27 (SUTURE) ×10 IMPLANT
SUT PROLENE 1 CT 1 30 (SUTURE) IMPLANT
SUT PROLENE 2 0 SH DA (SUTURE) IMPLANT
SUT VICRYL 0 UR6 27IN ABS (SUTURE) IMPLANT
TAPE STRIPS DRAPE STRL (GAUZE/BANDAGES/DRESSINGS) IMPLANT
TOWEL OR 17X26 10 PK STRL BLUE (TOWEL DISPOSABLE) ×2 IMPLANT
TRAY LAPAROSCOPIC (CUSTOM PROCEDURE TRAY) ×2 IMPLANT
TROCAR 11X100 Z THREAD (TROCAR) IMPLANT
TROCAR ADV FIXATION 5X100MM (TROCAR) ×2 IMPLANT
TROCAR XCEL NON-BLD 5MMX100MML (ENDOMECHANICALS) ×2 IMPLANT

## 2023-08-14 NOTE — Interval H&P Note (Signed)
 History and Physical Interval Note:  08/14/2023 2:48 PM  Tim Nelson  has presented today for surgery, with the diagnosis of PERIUMBILICAL INCARCERATED ABDOMINAL WALL HERNIAE.  The various methods of treatment have been discussed with the patient and family. After consideration of risks, benefits and other options for treatment, the patient has consented to  Procedure(s): REPAIR, HERNIA, VENTRAL, LAPAROSCOPIC (N/A) as a surgical intervention.  The patient's history has been reviewed, patient examined, no change in status, stable for surgery.  I have reviewed the patient's chart and labs.  Questions were answered to the patient's satisfaction.    I have re-reviewed the the patient's records, history, medications, and allergies.  I have re-examined the patient.  I again discussed intraoperative plans and goals of post-operative recovery.  The patient agrees to proceed.  Tim Nelson  26-Dec-1975 [id number]  Patient Care Team: Jesus Elberta Gainer, FNP as PCP - General (Family Medicine) Geralene Kaiser, MD as Consulting Physician (Psychiatry) Armbruster, Elspeth SQUIBB, MD as Consulting Physician (Gastroenterology) Sheldon Elspeth, MD as Consulting Physician (General Surgery)  Patient Active Problem List   Diagnosis Date Noted   MLH1-related Lynch syndrome [facility]) 05/04/2020    Priority: High   History of adenomatous polyp of colon 01/19/2023   Anxiety and depression    History of chronic cough    OSA on CPAP 08/19/2022   Early hepatic fibrosis 08/11/2022   Antimitochondrial antibody positive 01/17/2022   Nausea without vomiting 12/17/2021   Elevated liver enzymes 05/29/2021   Hepatic steatosis 05/29/2021   Chronic rhinitis 07/06/2019   Mixed hyperlipidemia 07/06/2019   GAD (generalized anxiety disorder) 06/02/2019   Mood disorder (HCC) 03/11/2019   Autism 09/15/2018   Nocturia 02/16/2018   DM type 2 with diabetic dyslipidemia (HCC) 06/09/2017   Hypertension associated with diabetes  (HCC) 03/03/2016   Testicle swelling 08/31/2014   Cough 02/11/2013   Stricture and stenosis of esophagus 12/11/2010   Family history of malignant neoplasm of gastrointestinal tract 10/23/2010   ANXIETY DEPRESSION 04/25/2008   HEMORRHOIDS 04/25/2008   GERD 04/25/2008   POLYP, COLON 05/13/2007    Past Medical History:  Diagnosis Date   Allergy    Anxiety and depression    Autism    Diabetes mellitus without complication (HCC)    Dysthymic disorder    Enlarged liver    Esophageal stricture    Esophagitis, unspecified    Fatty liver    GERD (gastroesophageal reflux disease)    Hepatic fibrosis    History of chronic cough    dry cough-sleeps with elevated head of bed.   Hx of colonic polyps    Hyperlipemia    Hypertension    Lynch syndrome    Metabolic dysfunction-associated steatotic liver disease (MASLD)    Seasonal allergies     Past Surgical History:  Procedure Laterality Date   BRAVO PH STUDY N/A 03/28/2013   Procedure: BRAVO PH STUDY;  Surgeon: Lamar JONETTA Aho, MD;  Location: [facility];  Service: Endoscopy;  Laterality: N/A;   COLONOSCOPY  2023   SA-MAC-plenvu (good)-TA-1-79yr recall   COLONSCOPY  02/04/2012   hx. polyps with past testing   DENTAL SURGERY     ESOPHAGEAL MANOMETRY N/A 04/25/2013   Procedure: ESOPHAGEAL MANOMETRY (EM);  Surgeon: Lamar JONETTA Aho, MD;  Location: [facility];  Service: Endoscopy;  Laterality: N/A;   ESOPHAGOGASTRODUODENOSCOPY N/A 03/28/2013   Procedure: ESOPHAGOGASTRODUODENOSCOPY (EGD);  Surgeon: Lamar JONETTA Aho, MD;  Location: [facility];  Service: Endoscopy;  Laterality: N/A;   FISTULOTOMY N/A 02/18/2023  Procedure: REPAIR OF PERIRECTAL FISTULA. LEFT LATERAL INTERNAL SPHINCTEROTOMY, FISTULOTOMY, HEMORRHOIDAL LIGATION/RECTOPEXY TIMES THREE, MARSUPIALIZATION;  Surgeon: Sheldon Standing, MD;  Location: [facility];  Service: General;  Laterality: N/A;   HERNIA REPAIR  AS INFANT   groin   MASS EXCISION N/A 02/18/2023   Procedure: REMOVAL OF  MASS THIGH 5CM;  Surgeon: Sheldon Standing, MD;  Location: [facility];  Service: General;  Laterality: N/A;   RECTAL EXAM UNDER ANESTHESIA N/A 02/18/2023   Procedure: ANORECTAL EXAM UNDER ANESTHESIA;  Surgeon: Sheldon Standing, MD;  Location: [facility];  Service: General;  Laterality: N/A;    Social History   Socioeconomic History   Marital status: Single    Spouse name: Not on file   Number of children: Not on file   Years of education: Not on file   Highest education level: Not on file  Occupational History   Occupation: Unemployed   Tobacco Use   Smoking status: Never    Passive exposure: Never   Smokeless tobacco: Never  Vaping Use   Vaping status: Never Used  Substance and Sexual Activity   Alcohol use: No   Drug use: No   Sexual activity: Never  Other Topics Concern   Not on file  Social History Narrative   Not on file   Social Drivers of Health   Financial Resource Strain: Not on file  Food Insecurity: Low Risk  (10/08/2022)   Received from [facility]   Hunger Vital Sign    Within the past 12 months, you worried that your food would run out before you got money to buy more: Never true    Within the past 12 months, the food you bought just didn't last and you didn't have money to get more. : Never true  Transportation Needs: No Transportation Needs (10/08/2022)   Received from [facility]    In the past 12 months, has lack of reliable transportation kept you from medical appointments, meetings, work or from getting things needed for daily living? : No  Physical Activity: Not on file  Stress: Not on file  Social Connections: Unknown (06/02/2021)   [facility] from [facility]   Social Network    Social Network: Not on file  Intimate Partner Violence: Unknown (05/09/2021)   Received from [facility]   HITS    Physically Hurt: Not on file    Insult or Talk Down To: Not on file    Threaten Physical Harm: Not on file    Scream or Curse: Not on file     Family History  Problem Relation Age of Onset   Colon cancer Mother 14   Irritable bowel syndrome Mother    Colon polyps Mother 58   Diabetes Mother    Kidney cancer Mother 44   Kidney disease Father    Congestive Heart Failure Father    Dementia Father    Kidney failure Father    Colon polyps Brother 71   Colon cancer Brother 68   Liver cancer Brother    Ovarian cancer Maternal Aunt    Liver cancer Maternal Aunt    Stomach cancer Maternal Aunt 72   Brain cancer Maternal Aunt    Colon polyps Maternal Grandfather 67   Colon cancer Maternal Grandfather 67   Esophageal cancer Maternal Grandfather 72   Rectal cancer Neg Hx     Medications Prior to Admission  Medication Sig Dispense Refill Last Dose/Taking   ARIPiprazole  (ABILIFY ) 5 MG tablet TAKE ONE (1) TABLET  EACH DAY 30 tablet 0 08/14/2023 at  8:30 AM   atorvastatin (LIPITOR) 10 MG tablet Take 10 mg by mouth every evening.    Taking   cetirizine (ZYRTEC) 10 MG tablet Take 10 mg by mouth daily.   08/14/2023 at  8:30 AM   dexlansoprazole  (DEXILANT ) 60 MG capsule TAKE ONE CAPSULE BY MOUTH DAILY 90 capsule 3 08/14/2023 at  8:30 AM   fenofibrate micronized (LOFIBRA) 134 MG capsule Take 134 mg by mouth daily before breakfast.   08/14/2023 Morning   hydrochlorothiazide (HYDRODIURIL) 25 MG tablet Take 25 mg by mouth daily.   08/13/2023   lamoTRIgine  (LAMICTAL ) 25 MG tablet TAKE 1 TABLET DAILY FOR 1 WEEK THEN TAKE 2 TABLETS DAILY (Patient taking differently: Take 50 mg by mouth daily.) 60 tablet 0 08/14/2023 Morning   lisinopril (ZESTRIL) 5 MG tablet Take 5 mg by mouth daily.   08/13/2023   metFORMIN (GLUCOPHAGE-XR) 500 MG 24 hr tablet Take 500 mg by mouth daily with breakfast.   08/13/2023   montelukast (SINGULAIR) 10 MG tablet Take 10 mg by mouth at bedtime.   08/13/2023 Bedtime   NON FORMULARY Pt uses a cpap nightly   08/13/2023   sertraline  (ZOLOFT ) 50 MG tablet Take 50 mg by mouth daily.   08/14/2023 Morning   oxyCODONE  (OXY  IR/ROXICODONE ) 5 MG immediate release tablet Take 1 tablet (5 mg total) by mouth every 6 (six) hours as needed for severe pain (pain score 7-10) or breakthrough pain. (Patient not taking: Reported on 07/30/2023) 30 tablet 0 Not Taking    Current Facility-Administered Medications  Medication Dose Route Frequency Provider Last Rate Last Admin   bupivacaine  liposome (EXPAREL ) 1.3 % injection 266 mg  20 mL Infiltration Once Sheldon Standing, MD       Chlorhexidine  Gluconate Cloth 2 % PADS 6 each  6 each Topical Once Sheldon Standing, MD       And   Chlorhexidine  Gluconate Cloth 2 % PADS 6 each  6 each Topical Once Donn Zanetti, Standing, MD       clindamycin  (CLEOCIN ) IVPB 900 mg  900 mg Intravenous On Call to OR Sheldon Standing, MD       And   gentamicin  (GARAMYCIN ) 440 mg in dextrose  5 % 100 mL IVPB  5 mg/kg (Adjusted) Intravenous On Call to OR Sheldon Standing, MD       insulin  aspart (novoLOG ) injection 0-14 Units  0-14 Units Subcutaneous Q2H PRN Peggye Delon Brunswick, MD       lactated ringers  infusion   Intravenous Continuous Keneth Lynwood POUR, MD 10 mL/hr at 08/14/23 1244 Started During Downtime at 08/14/23 1244     Allergies  Allergen Reactions   Cefaclor Shortness Of Breath and Swelling   Dimetapp Dm Cold-Cough [Phenylephrine -Bromphen-Dm] Shortness Of Breath and Swelling   Furosemide Rash   Reglan  [Metoclopramide ] Anxiety and Other (See Comments)    Weight loss/mental breakdown    BP 137/89   Pulse 60   Temp 98.3 F (36.8 C) (Oral)   Resp 18   Ht 5' 10 (1.778 m)   Wt 109 kg   SpO2 97%   BMI 34.47 kg/m   Labs: Results for orders placed or performed during the hospital encounter of 08/14/23 (from the past 48 hours)  Glucose, capillary     Status: None   Collection Time: 08/14/23 12:41 PM  Result Value Ref Range   Glucose-Capillary 91 70 - 99 mg/dL    Comment: Glucose reference range applies only to samples taken  after fasting for at least 8 hours.    Imaging / Studies: No results  found.   Briant KYM Schultze, M.D., F.A.C.S. Gastrointestinal and Minimally Invasive Surgery [facility] [address] [place], [place] [address] [phone] Main / Paging  08/14/2023 2:48 PM    Elspeth JAYSON Schultze

## 2023-08-14 NOTE — Op Note (Signed)
 08/14/2023  PATIENT:  Tim Nelson  48 y.o. male  Patient Care Team: Jesus Elberta Gainer, FNP as PCP - General (Family Medicine) Geralene Kaiser, MD as Consulting Physician (Psychiatry) Armbruster, Tim SQUIBB, MD as Consulting Physician (Gastroenterology) Sheldon Elspeth, MD as Consulting Physician (General Surgery)  PRE-OPERATIVE DIAGNOSIS:  PERIUMBILICAL INCARCERATED ABDOMINAL WALL HERNIA Dimensions of hernia by pre-op evaluation:  4cm x 4cm  POST-OPERATIVE DIAGNOSIS:  PERIUMBILICAL INCARCERATED ABDOMINAL WALL HERNIAE  Dimensions of hernia post-op:  7cm x 4cm  PROCEDURE:   LAPAROSCOPIC REPAIR OF ABDOMINAL HERNIAE WITH MESH  (See OR Findings below) TAP BLOCK - BILATERAL  SURGEON:  Tim KYM Sheldon, MD  ASSISTANT:  Odelia Flock, MD, PGY4, [employer]     ANESTHESIA:  General endotracheal intubation anesthesia (GETA) and Regional TRANSVERSUS ABDOMINIS PLANE (TAP) nerve block -BILATERAL for perioperative & postoperative pain control at the level of the transverse abdominis & preperitoneal spaces along the flank at the anterior axillary line, from subcostal ridge to iliac crest under laparoscopic guidance provided with liposomal bupivacaine  (Experel) 20mL mixed with 60 mL of bupivicaine 0.25% with epinephrine   Estimated Blood Loss (EBL):   Total I/O In: 400 [I.V.:400] Out: - .   (See anesthesia record)  Delay start of Pharmacological VTE agent (>24hrs) due to concerns of significant anemia, surgical blood loss, or risk of bleeding?:  no  DRAINS: (None)  SPECIMEN:  (no specimen)  DISPOSITION OF SPECIMEN:  (not applicable)  COUNTS:  Sponge, needle, & instrument counts CORRECT at the conclusion of the case.      PLAN OF CARE: Discharge to home after PACU  PATIENT DISPOSITION:  PACU - hemodynamically stable.  INDICATION: Pleasant patient has developed a ventral wall abdominal hernia. Recommendation was made for surgical repair  The anatomy & physiology of the abdominal wall  was discussed. The pathophysiology of hernias was discussed. Natural history risks without surgery including progeressive enlargement, pain, incarceration & strangulation was discussed. Contributors to complications such as smoking, obesity, diabetes, prior surgery, etc were discussed.  I feel the risks of no intervention will lead to serious problems that outweigh the operative risks; therefore, I recommended surgery to reduce and repair the hernia. I explained laparoscopic techniques with possible need for an open approach. I noted the probable use of mesh to patch and/or buttress the hernia repair.  Risks such as bleeding, infection, abscess, need for further treatment, heart attack, death, and other risks were discussed. I noted a good likelihood this will help address the problem. Goals of post-operative recovery were discussed as well. Possibility that this will not correct all symptoms was explained. I stressed the importance of low-impact activity, aggressive pain control, avoiding constipation, & not pushing through pain to minimize risk of post-operative chronic pain or injury. Possibility of reherniation especially with smoking, obesity, diabetes, immunosuppression, and other health conditions was discussed. We will work to minimize complications.   An educational handout further explaining the pathology & treatment options was given as well. Questions were answered. The patient expresses understanding & wishes to proceed with surgery.   OR FINDINGS: Cluster of Swiss cheese type hernias in the setting of significant diastases recti.  7 x 4 cm region.  Omentum incarcerated in a few spots and falciform ligament and others.  Type of ventral wall repair:  Laparoscopic underlay repair .with Primary repair of largest hernia Placement of mesh: Centrally intraperitoneal with edges tucked into RECTRORECTUS & preperitoneal space Name of mesh: Bard Ventralight dual sided (polypropylene / Seprafilm) Size  of mesh:  23x17cm Orientation: Transverse Mesh overlap:  5-7    DESCRIPTION:   Informed consent was confirmed. The patient underwent general anaesthesia without difficulty. The patient was positioned appropriately. VTE prevention in place. The patient's abdomen was clipped, prepped, & draped in a sterile fashion. Surgical timeout confirmed our plan.  The patient was positioned in reverse Trendelenburg. Abdominal entry was gained using optical entry technique in the left upper abdomen. Entry was clean. I induced carbon dioxide insufflation. Camera inspection revealed no injury. Extra ports were carefully placed under direct laparoscopic visualization.   I could see adhesions on the parietal peritoneum under the abdominal wall.  We did laparoscopic lysis of adhesions to expose the entire anterior abdominal wall.  I primarily used focused sharp dissection.  I freed off the falciform ligament and central peritoneum to expose the retrorectus fascia   I made sure hemostasis was good.  I mapped out the region using a needle passer.   To minimize recurrence & ensure that I would have at least 5 cm radial coverage beyond the hernia defect, I chose a 23x17cm dual sided mesh.  I placed #1 PDS stitches around its edge about every 5 cm = 8 total.  I rolled the mesh & placed into the peritoneal cavity through the largest hernia defect.  I unrolled the mesh and positioned it appropriately.  I secured the mesh to cover up the hernia defect using a laparoscopic suture passer to pass the tails of the PDS through the abdominal wall & tagged them with clamps for good transfascial suturing.  I started out in four corners to make sure I had the mesh centered under the hernia defect appropriately, and then proceeded to work in quadrants.    We evacuated CO2 & desufflated the abdomen.  I tied the fascial stitches down. I closed the fascial defect that I placed the mesh through using #1 PDS primarily interrupted  stitchestransversely.  There was another exposed hernia that we closed primarily with 0 Vicryl suture.  We used these stitches to tack to the mesh to help centrally tack the mesh as well.  I reinsufflated the abdomen. The mesh provided at least circumferential coverage around the entire region of hernia defects.  We tack back up the inferior and superior peritoneal flaps to help tuck the inferior and superior corners of the mesh into the preperitoneal/retrorectus space.  We tacked the edges & central part of the mesh to the peritoneum/posterior rectus fascia with SecureStrap absorbable tacks.   I did reinspection. Hemostasis was good. Mesh laid well. I completed a broad field block of local anesthesia at fascial stitch sites & fascial closure areas.    Capnoperitoneum was evacuated. Ports were removed. The skin was closed with Monocryl at the port sites and Steri-Strips on the fascial stitch puncture sites.  Patient is being extubated to go to the recovery room.  I discussed operative findings, updated the patient's status, discussed probable steps to recovery, and gave postoperative recommendations to the patient's sister, Tim Nelson .  Recommendations were made.  Questions were answered.  She expressed understanding & appreciation.  Tim Nelson, M.D., F.A.C.S. Gastrointestinal and Minimally Invasive Surgery [facility] [address], [address] [place], [place] [address] [phone] Main / Paging  08/14/2023 5:33 PM

## 2023-08-14 NOTE — Discharge Instructions (Signed)
 HERNIA REPAIR: POST OP INSTRUCTIONS  ######################################################################  EAT Gradually transition to a high fiber diet with a fiber supplement over the next few weeks after discharge.  Start with a pureed / full liquid diet (see below)  WALK Walk an hour a day.  Control your pain to do that.    CONTROL PAIN Control pain so that you can walk, sleep, tolerate sneezing/coughing, and go up/down stairs.  HAVE A BOWEL MOVEMENT DAILY Keep your bowels regular to avoid problems.  OK to try a laxative to override constipation.  OK to use an antidairrheal to slow down diarrhea.  Call if not better after 2 tries  CALL IF YOU HAVE PROBLEMS/CONCERNS Call if you are still struggling despite following these instructions. Call if you have concerns not answered by these instructions  ######################################################################    DIET: Follow a light bland diet & liquids the first 24 hours after arrival home, such as soup, liquids, starches, etc.  Be sure to drink plenty of fluids.  Quickly advance to a usual solid diet within a few days.  Avoid fast food or heavy meals as your are more likely to get nauseated or have irregular bowels.  A low-fat, high-fiber diet for the rest of your life is ideal.   Take your usually prescribed home medications unless otherwise directed.  PAIN CONTROL: Pain is best controlled by a usual combination of three different methods TOGETHER: Ice/Heat Over the counter pain medication Prescription pain medication Most patients will experience some swelling and bruising around the hernia(s) such as the bellybutton, groins, or old incisions.  Ice packs or heating pads (30-60 minutes up to 6 times a day) will help. Use ice for the first few days to help decrease swelling and bruising, then switch to heat to help relax tight/sore spots and speed recovery.  Some people prefer to use ice alone, heat alone, alternating  between ice & heat.  Experiment to what works for you.  Swelling and bruising can take several weeks to resolve.   It is helpful to take an over-the-counter pain medication regularly for the first few weeks.  Choose one of the following that works best for you: Naproxen (Aleve, etc)  Two 220mg  tabs twice a day Ibuprofen (Advil, etc) Three 200mg  tabs four times a day (every meal & bedtime) Acetaminophen  (Tylenol , etc) 325-650mg  four times a day (every meal & bedtime) A  prescription for pain medication should be given to you upon discharge.  Take your pain medication as prescribed.  If you are having problems/concerns with the prescription medicine (does not control pain, nausea, vomiting, rash, itching, etc), please call us  [phone] to see if we need to switch you to a different pain medicine that will work better for you and/or control your side effect better. If you need a refill on your pain medication, please contact your pharmacy.  They will contact our office to request authorization. Prescriptions will not be filled after 5 pm or on week-ends.  AVOID GETTING CONSTIPATED.   Between the surgery and the pain medications, it is common to experience some constipation.  Drink plenty of liquids Take a fiber supplement 2 times day (such as Metamucil, Citrucel, FiberCon, MiraLax, etc) to have a bowel movement every day. If you have not had a BM by 2 days after surgery: -drink liquids only until you have a bowel movement -take MiraLAX 2 doses every 2 hours until you have a bowel movement   Wash / shower every day.  You may  shower over the dressings as they are waterproof.    It is good for closed incisions and even open wounds to be washed every day.  Shower every day.  Short baths are fine.  Wash the incisions and wounds clean with soap & water.    You may leave closed incisions open to air if it is dry.   You may cover the incision with clean gauze & replace it after your daily shower for  comfort.  TEGADERM & STERISTRIPS:  You have clear gauze band-aid dressings over your closed incision(s).  You also have skin tapes called Steristrips on your incisions.  Leave these in place.  You may trim any edges that curl up with clean scissors.  You may remove all dressings & tapes in the shower in 2 days = 7/13.    ACTIVITIES as tolerated:   You may resume regular (light) daily activities beginning the next day--such as daily self-care, walking, climbing stairs--gradually increasing activities as tolerated.  Control your pain so that you can walk an hour a day.  If you can walk 30 minutes without difficulty, it is safe to try more intense activity such as jogging, treadmill, bicycling, low-impact aerobics, swimming, etc. Save the most intensive and strenuous activity for last such as sit-ups, heavy lifting, contact sports, etc  Refrain from any heavy lifting or straining until you are off narcotics for pain control.   DO NOT PUSH THROUGH PAIN.  Let pain be your guide: If it hurts to do something, don't do it.  Pain is your body warning you to avoid that activity for another week until the pain goes down. You may drive when you are no longer taking prescription pain medication, you can comfortably wear a seatbelt, and you can safely maneuver your car and apply brakes. You may have sexual intercourse when it is comfortable.   FOLLOW UP in our office Please call [facility] at [phone] to set up an appointment to see your surgeon in the office for a follow-up appointment approximately 2-3 weeks after your surgery. Make sure that you call for this appointment the day you arrive home to insure a convenient appointment time.  9.  If you have disability of FMLA / Family leave forms, please bring the forms to the office for processing.  (do not give to your surgeon).  WHEN TO CALL US  [phone]: Poor pain control Reactions / problems with new medications (rash/itching, nausea, etc)  Fever over  101.5 F (38.5 C) Inability to urinate Nausea and/or vomiting Worsening swelling or bruising Continued bleeding from incision. Increased pain, redness, or drainage from the incision   The clinic staff is available to answer your questions during regular business hours (8:30am-5pm).  Please don't hesitate to call and ask to speak to one of our nurses for clinical concerns.   If you have a medical emergency, go to the nearest emergency room or call 911.  A surgeon from [facility] is always on call at the hospitals in [facility], [place] [address], [place], [place]  [address] ?  [address] [address], [place], [place]   [address] MAIN: [phone] ? TOLL FREE: [phone] ? FAX: [phone] [email or web address]

## 2023-08-14 NOTE — H&P (Signed)
 08/14/2023   PROVIDER: ELSPETH JUDAH SCHULTZE, MD  Patient Care Team: Tim Nelson as PCP - General (Family Medicine) SCHULTZE, ELSPETH JUDAH, MD as Consulting Provider (General Surgery) Armbruster, ELSPETH Mt, MD (Gastroenterology)  [facility] MRN: [id number] DOB: 1975/11/01 DATE OF ENCOUNTER: 05/12/2023  Interval History:   The patient returns to the office after undergoing anal fistulotomy and marsupialization for superficial anal fistula. 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 Hemorrhoid ligation & pexy and hemorrhoidectomy. Excision of pedunculated thigh mass consistent with benign fibroepithelial polyp. On 02/18/2023  Pathology: Benign fibroepithelial polyp. Negative for malignancy. Anal fissure ulcer with acute inflammation squamous hyperplasia and marked hyperkeratosis.  Patient returns for second visit after fistula repair  PRIOR VISIT 03/31/2023 Patient comes in with follow-up with his sister. He has been doing better overall. Takes a stool softener and fiber and moves his bowels couple times a day. Bowels are soft. No incontinence or bleeding or feelings. Denies much pain.    Labs, Imaging and Diagnostic Testing:  Located in 'Care Everywhere' section of Epic EMR chart  PRIOR [facility] CLINIC NOTES:  Located in 'Care Everywhere' section of Epic EMR chart  SURGERY NOTES:  Located in 'Care Everywhere' section of Epic EMR chart  02/18/2023  POST-OPERATIVE DIAGNOSIS:  ANAL FISSURE ANAL FISTULA - SUPERFICIAL HEMORRHOID - INTERNAL GRADE 3 PROLAPSING HEMORRHOID - INTERNAL GRADE 2 PROLAPSING POSTERIOR THIGH MASS  PROCEDURE:  ANAL FISTULOTOMY WITH MARSUPIALIZATION ANAL SPHINCTEROTOMY - internal sphincter - partial  HEMORRHOIDAL LIGATION & HEMORRHOIDOPEXY x 3 EXCISION OF POSTERIOR THIGH MASS  SURGEON: ELSPETH KYM SCHULTZE, MD  OR FINDINGS: Patient had a posterior midline 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 Superficial fistula tract going more externally as well. No evidence of any intersphincteric fistula. Sphincterotomy location: Left lateral anal canal. 66% distal internal sphincterotomy performed   Grade 2 and 3 internal hemorrhoids, right anterior largest. 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 Left proximal posterior inner thigh. Excision and closure done.  PATHOLOGY:  Located in 'Care Everywhere' section of Epic EMR chart  Physical Examination:   Body mass index is 34.87 kg/m.  Constitutional: Not cachectic. Hygeine adequate.  Eyes: Normal extraocular movements. Sclera nonicteric Neuro: No major focal sensory defects. No major motor deficits. Psych: No severe agitation. No severe anxiety. Judgment & insight Impaired, Oriented x4, HENT: Normocephalic, Mucus membranes moist. No thrush.  Neck: Supple, No tracheal deviation.  Chest: Good respiratory excursion. No audible wheezing CV: No major extremity edema Ext: No obvious deformity or contracture. Edema: not present. No cyanosis Skin: Warm and dry Musculoskeletal: Mobility: no assist device moving easily without restrictions Abdomen: Nontender. Soft. Nondistended. Abdomen remains morbidly obese with soft with some diastases. He does have a 1 x 1 cm umbilical hernia with a 4 x 3 cm supraumbilical mass that is sensitive but eventually reduces to a smaller fascial defect. 5 x 4 cm region.   Rectal:  Perianal skin Clean with good hygiene  Pruritis ani: Mild Pilonidal disease: Not present Condyloma / warts: Not present  Anal fissure: Posterior midline superficial scar closed down. No active granulation.  Perirectal abscess/fistula Not present External hemorrhoids Not present  Digital and anoscopic rectal exam Deferred  Sphincter tone Normal   Other significant findings:  Left posterior thigh close incision at site of large fibrotic polyp removal. No breakdown  Patient examined with  patient in decubitus position .  ###################################    Assessment and Plan:   Tim Nelson is a 48 y.o. male recovering s/p hemorrhoid ligation & pexy hemorrhoidectomy as well as partial internal sphincterotomy for chronic anal fissure and marsupialization of involved fistula as well. Removal of large pedunculated posterior thigh mass that is a benign fibrotic polyp. 02/18/2023  Copy of pathology report given to patient and family.  Diagnoses and all orders for this visit:  Anal fistula  Umbilical hernia without obstruction and without gangrene    I think he has 2 periumbilical hernias and they are slightly larger and he is getting more sensitive.  Plan laparoscopic surgery to repair and patch with underlay IPUM mesh.   The anatomy & physiology of the abdominal wall was discussed. The pathophysiology of hernias was discussed. Natural history risks without surgery including progeressive enlargement, pain, incarceration, & strangulation was discussed. Contributors to complications such as smoking, obesity, diabetes, prior surgery, etc were discussed.   I feel the risks of no intervention will lead to serious problems that outweigh the operative risks; therefore, I recommended surgery to reduce and repair the hernia. I explained laparoscopic techniques with possible need for an open approach. I noted the probable use of mesh to patch and/or buttress the hernia repair  Risks such as bleeding, infection, abscess, need for further treatment, injury to other organs, need for repair of tissues / organs, stroke, heart attack, death, and other risks were discussed. I noted a good likelihood this will help address the problem. Goals of post-operative recovery were discussed as well. Possibility that this will not correct all symptoms was explained. I stressed the importance  of low-impact activity, aggressive pain control, avoiding constipation, & not pushing through pain to minimize risk of post-operative chronic pain or injury. Possibility of reherniation especially with smoking, obesity, diabetes, immunosuppression, and other health conditions was discussed. We will work to minimize complications.   Elspeth KYM Schultze, MD, FACS, MASCRS Esophageal, Gastrointestinal & Colorectal Surgery Robotic and Minimally Invasive Surgery  [facility] [address], [address] [place], [place] [address] [phone] Fax [phone] Main  CONTACT INFORMATION: Weekday (9AM-5PM): Call [facility] main office at [phone] Weeknight (5PM-9AM) or Weekend/Holiday: Check EPIC Web Links tab & use AMION (password  [email or web address]) for General Surgery [facility] coverage  Please, DO NOT use SecureChat  (it is not reliable communication to reach operating surgeons & will lead to a delay in care).   Epic staff messaging available for outptient concerns needing 1-2 business day response.       08/14/2023

## 2023-08-14 NOTE — Anesthesia Procedure Notes (Signed)
 Procedure Name: Intubation Date/Time: 08/14/2023 3:50 PM  Performed by: Obadiah Reyes BROCKS, CRNAPre-anesthesia Checklist: Patient identified, Emergency Drugs available, Suction available and Patient being monitored Patient Re-evaluated:Patient Re-evaluated prior to induction Oxygen Delivery Method: Circle System Utilized Preoxygenation: Pre-oxygenation with 100% oxygen Induction Type: IV induction Ventilation: Mask ventilation without difficulty Laryngoscope Size: Miller and 2 Grade View: Grade I Tube type: Oral Tube size: 7.5 mm Number of attempts: 1 Airway Equipment and Method: Stylet and Oral airway Placement Confirmation: ETT inserted through vocal cords under direct vision, positive ETCO2 and breath sounds checked- equal and bilateral Secured at: 22 cm Tube secured with: Tape Dental Injury: Teeth and Oropharynx as per pre-operative assessment

## 2023-08-14 NOTE — Anesthesia Postprocedure Evaluation (Signed)
 Anesthesia Post Note  Patient: Tim Nelson  Procedure(s) Performed: REPAIR, HERNIA, VENTRAL, LAPAROSCOPIC (Abdomen)     Patient location during evaluation: PACU Anesthesia Type: General Level of consciousness: awake and alert, patient cooperative and oriented Pain management: pain level controlled Vital Signs Assessment: post-procedure vital signs reviewed and stable Respiratory status: spontaneous breathing, nonlabored ventilation and respiratory function stable Cardiovascular status: blood pressure returned to baseline and stable Postop Assessment: no apparent nausea or vomiting Anesthetic complications: no   No notable events documented.  Last Vitals:  Vitals:   08/14/23 1815 08/14/23 1830  BP: 120/72 125/74  Pulse: 76 64  Resp: 17 12  Temp:    SpO2: 94% 91%    Last Pain:  Vitals:   08/14/23 1830  TempSrc:   PainSc: 4                  Jenea Dake,E. Marqueze Ramcharan

## 2023-08-14 NOTE — Anesthesia Preprocedure Evaluation (Addendum)
 Anesthesia Evaluation  Patient identified by MRN, date of birth, ID band Patient awake    Reviewed: Allergy & Precautions, NPO status , Patient's Chart, lab work & pertinent test results  History of Anesthesia Complications Negative for: history of anesthetic complications  Airway Mallampati: III  TM Distance: >3 FB Neck ROM: Full   Comment: Previous grade I view with MAC 3, easy mask Dental  (+) Dental Advisory Given   Pulmonary neg shortness of breath, sleep apnea and Continuous Positive Airway Pressure Ventilation , neg COPD, neg recent URI   Pulmonary exam normal breath sounds clear to auscultation       Cardiovascular hypertension (HCTZ, lisinopril), Pt. on medications (-) angina (-) Past MI, (-) Cardiac Stents and (-) CABG (-) dysrhythmias  Rhythm:Regular Rate:Normal  HLD  TTE 06/06/2021: IMPRESSIONS    1. Left ventricular ejection fraction, by estimation, is 60 to 65%. The  left ventricle has normal function. The left ventricle has no regional  wall motion abnormalities. Left ventricular diastolic parameters were  normal. The average left ventricular  global longitudinal strain is -19.6 %. The global longitudinal strain is  normal.   2. Right ventricular systolic function is normal. The right ventricular  size is normal. There is normal pulmonary artery systolic pressure. The  estimated right ventricular systolic pressure is 23.2 mmHg.   3. The mitral valve is normal in structure. No evidence of mitral valve  regurgitation. No evidence of mitral stenosis.   4. The aortic valve is normal in structure. Aortic valve regurgitation is  not visualized. No aortic stenosis is present.   5. Aortic dilatation noted. There is mild dilatation of the aortic root,  measuring 39 mm. There is borderline dilatation of the ascending aorta,  measuring 37 mm.   6. The inferior vena cava is normal in size with greater than 50%  respiratory  variability, suggesting right atrial pressure of 3 mmHg.     Neuro/Psych neg Seizures PSYCHIATRIC DISORDERS (autistic) Anxiety Depression    negative neurological ROS     GI/Hepatic ,GERD  Medicated,,Hepatic fibrosis Lynch syndrome   Endo/Other  diabetes (Hgb A1c 6.1), Well Controlled, Type 2, Oral Hypoglycemic Agents    Renal/GU negative Renal ROS     Musculoskeletal   Abdominal  (+) + obese  Peds  Hematology negative hematology ROS (+) Lab Results      Component                Value               Date                      WBC                      6.9                 08/03/2023                HGB                      15.1                08/03/2023                HCT                      45.0  08/03/2023                MCV                      87.0                08/03/2023                PLT                      302                 08/03/2023              Anesthesia Other Findings   Reproductive/Obstetrics                              Anesthesia Physical Anesthesia Plan  ASA: 3  Anesthesia Plan: General   Post-op Pain Management: Tylenol  PO (pre-op)*   Induction: Intravenous  PONV Risk Score and Plan: 2 and Ondansetron , Dexamethasone , Midazolam  and Treatment may vary due to age or medical condition  Airway Management Planned: Oral ETT  Additional Equipment:   Intra-op Plan:   Post-operative Plan: Extubation in OR  Informed Consent: I have reviewed the patients History and Physical, chart, labs and discussed the procedure including the risks, benefits and alternatives for the proposed anesthesia with the patient or authorized representative who has indicated his/her understanding and acceptance.     Dental advisory given  Plan Discussed with: CRNA and Anesthesiologist  Anesthesia Plan Comments: (Risks of general anesthesia discussed including, but not limited to, sore throat, hoarse voice, chipped/damaged teeth,  injury to vocal cords, nausea and vomiting, allergic reactions, lung infection, heart attack, stroke, and death. All questions answered. )         Anesthesia Quick Evaluation

## 2023-08-14 NOTE — Transfer of Care (Signed)
 Immediate Anesthesia Transfer of Care Note  Patient: Tim Nelson  Procedure(s) Performed: Procedure(s): REPAIR, HERNIA, VENTRAL, LAPAROSCOPIC (N/A)  Patient Location: PACU  Anesthesia Type:General  Level of Consciousness:  sedated, patient cooperative and responds to stimulation  Airway & Oxygen Therapy:Patient Spontanous Breathing and Patient connected to face mask oxgen  Post-op Assessment:  Report given to PACU RN and Post -op Vital signs reviewed and stable  Post vital signs:  Reviewed and stable  Last Vitals:  Vitals:   08/14/23 1203  BP: 137/89  Pulse: 60  Resp: 18  Temp: 36.8 C  SpO2: 97%    Complications: No apparent anesthesia complications

## 2023-08-17 ENCOUNTER — Encounter (HOSPITAL_COMMUNITY): Payer: Self-pay | Admitting: Surgery

## 2023-08-19 ENCOUNTER — Ambulatory Visit: Admitting: Urology

## 2023-08-19 NOTE — Progress Notes (Deleted)
 Assessment: 1. Penile lesion      Plan: I personally reviewed the patient's chart including provider notes.   Chief Complaint: No chief complaint on file.   History of Present Illness:  Tim Nelson is a 48 y.o. male who is seen in consultation from Jesus Elberta Gainer, FNP for evaluation of penile lesion.    Past Medical History:  Past Medical History:  Diagnosis Date   Allergy    Anxiety and depression    Autism    Diabetes mellitus without complication (HCC)    Dysthymic disorder    Enlarged liver    Esophageal stricture    Esophagitis, unspecified    Fatty liver    GERD (gastroesophageal reflux disease)    Hepatic fibrosis    History of chronic cough    dry cough-sleeps with elevated head of bed.   Hx of colonic polyps    Hyperlipemia    Hypertension    Lynch syndrome    Metabolic dysfunction-associated steatotic liver disease (MASLD)    Seasonal allergies     Past Surgical History:  Past Surgical History:  Procedure Laterality Date   BRAVO PH STUDY N/A 03/28/2013   Procedure: BRAVO PH STUDY;  Surgeon: Lamar JONETTA Aho, MD;  Location: [facility];  Service: Endoscopy;  Laterality: N/A;   COLONOSCOPY  2023   SA-MAC-plenvu (good)-TA-1-61yr recall   COLONSCOPY  02/04/2012   hx. polyps with past testing   DENTAL SURGERY     ESOPHAGEAL MANOMETRY N/A 04/25/2013   Procedure: ESOPHAGEAL MANOMETRY (EM);  Surgeon: Lamar JONETTA Aho, MD;  Location: [facility];  Service: Endoscopy;  Laterality: N/A;   ESOPHAGOGASTRODUODENOSCOPY N/A 03/28/2013   Procedure: ESOPHAGOGASTRODUODENOSCOPY (EGD);  Surgeon: Lamar JONETTA Aho, MD;  Location: [facility];  Service: Endoscopy;  Laterality: N/A;   FISTULOTOMY N/A 02/18/2023   Procedure: REPAIR OF PERIRECTAL FISTULA. LEFT LATERAL INTERNAL SPHINCTEROTOMY, FISTULOTOMY, HEMORRHOIDAL LIGATION/RECTOPEXY TIMES THREE, MARSUPIALIZATION;  Surgeon: Sheldon Standing, MD;  Location: [facility];  Service: General;  Laterality: N/A;   HERNIA  REPAIR  AS INFANT   groin   MASS EXCISION N/A 02/18/2023   Procedure: REMOVAL OF MASS THIGH 5CM;  Surgeon: Sheldon Standing, MD;  Location: [facility];  Service: General;  Laterality: N/A;   RECTAL EXAM UNDER ANESTHESIA N/A 02/18/2023   Procedure: ANORECTAL EXAM UNDER ANESTHESIA;  Surgeon: Sheldon Standing, MD;  Location: [facility];  Service: General;  Laterality: N/A;   VENTRAL HERNIA REPAIR N/A 08/14/2023   Procedure: REPAIR, HERNIA, VENTRAL, LAPAROSCOPIC;  Surgeon: Sheldon Standing, MD;  Location: [facility];  Service: General;  Laterality: N/A;    Allergies:  Allergies  Allergen Reactions   Cefaclor Shortness Of Breath and Swelling   Dimetapp Dm Cold-Cough [Phenylephrine -Bromphen-Dm] Shortness Of Breath and Swelling   Furosemide Rash   Reglan  [Metoclopramide ] Anxiety and Other (See Comments)    Weight loss/mental breakdown    Family History:  Family History  Problem Relation Age of Onset   Colon cancer Mother 57   Irritable bowel syndrome Mother    Colon polyps Mother 8   Diabetes Mother    Kidney cancer Mother 34   Kidney disease Father    Congestive Heart Failure Father    Dementia Father    Kidney failure Father    Colon polyps Brother 61   Colon cancer Brother 69   Liver cancer Brother    Ovarian cancer Maternal Aunt    Liver cancer Maternal Aunt    Stomach cancer Maternal Aunt 79   Brain cancer Maternal  Aunt    Colon polyps Maternal Grandfather 67   Colon cancer Maternal Grandfather 67   Esophageal cancer Maternal Grandfather 72   Rectal cancer Neg Hx     Social History:  Social History   Tobacco Use   Smoking status: Never    Passive exposure: Never   Smokeless tobacco: Never  Vaping Use   Vaping status: Never Used  Substance Use Topics   Alcohol use: No   Drug use: No    Review of symptoms:  Constitutional:  Negative for unexplained weight loss, night sweats, fever, chills ENT:  Negative for nose bleeds, sinus pain, painful swallowing CV:  Negative for chest pain,  shortness of breath, exercise intolerance, palpitations, loss of consciousness Resp:  Negative for cough, wheezing, shortness of breath GI:  Negative for nausea, vomiting, diarrhea, bloody stools GU:  Positives noted in HPI; otherwise negative for gross hematuria, dysuria, urinary incontinence Neuro:  Negative for seizures, poor balance, limb weakness, slurred speech Psych:  Negative for lack of energy, depression, anxiety Endocrine:  Negative for polydipsia, polyuria, symptoms of hypoglycemia (dizziness, hunger, sweating) Hematologic:  Negative for anemia, purpura, petechia, prolonged or excessive bleeding, use of anticoagulants  Allergic:  Negative for difficulty breathing or choking as a result of exposure to anything; no shellfish allergy; no allergic response (rash/itch) to materials, foods  Physical exam: There were no vitals taken for this visit. GENERAL APPEARANCE:  Well appearing, well developed, well nourished, NAD HEENT:  Atraumatic, normocephalic, oropharynx clear NECK:  Supple without lymphadenopathy or thyromegaly ABDOMEN:  Soft, non-tender, no masses EXTREMITIES:  Moves all extremities well, without clubbing, cyanosis, or edema NEUROLOGIC:  Alert and oriented x 3, normal gait, CN II-XII grossly intact MENTAL STATUS:  appropriate BACK:  Non-tender to palpation, No CVAT SKIN:  Warm, dry, and intact GU: Penis:  {Exam; penis:5791} Meatus: {Meatus:15530} Scrotum: {pe scrotum:310183} Testis: {Exam; testicles:5790}  Results: None

## 2023-08-20 ENCOUNTER — Ambulatory Visit: Admitting: Pulmonary Disease

## 2023-09-17 ENCOUNTER — Telehealth: Payer: Self-pay

## 2023-09-17 ENCOUNTER — Other Ambulatory Visit (HOSPITAL_COMMUNITY): Payer: Self-pay

## 2023-09-17 ENCOUNTER — Ambulatory Visit: Admitting: Urology

## 2023-09-17 NOTE — Progress Notes (Deleted)
 Assessment: 1. Penile lesion      Plan: I personally reviewed the patient's chart including provider notes.   Chief Complaint: No chief complaint on file.   History of Present Illness:  Tim Nelson is a 48 y.o. male who is seen in consultation from Jesus Elberta Gainer, FNP for evaluation of penile lesion.    Past Medical History:  Past Medical History:  Diagnosis Date   Allergy    Anxiety and depression    Autism    Diabetes mellitus without complication (HCC)    Dysthymic disorder    Enlarged liver    Esophageal stricture    Esophagitis, unspecified    Fatty liver    GERD (gastroesophageal reflux disease)    Hepatic fibrosis    History of chronic cough    dry cough-sleeps with elevated head of bed.   Hx of colonic polyps    Hyperlipemia    Hypertension    Lynch syndrome    Metabolic dysfunction-associated steatotic liver disease (MASLD)    Seasonal allergies     Past Surgical History:  Past Surgical History:  Procedure Laterality Date   BRAVO PH STUDY N/A 03/28/2013   Procedure: BRAVO PH STUDY;  Surgeon: Lamar JONETTA Aho, MD;  Location: [facility];  Service: Endoscopy;  Laterality: N/A;   COLONOSCOPY  2023   SA-MAC-plenvu (good)-TA-1-61yr recall   COLONSCOPY  02/04/2012   hx. polyps with past testing   DENTAL SURGERY     ESOPHAGEAL MANOMETRY N/A 04/25/2013   Procedure: ESOPHAGEAL MANOMETRY (EM);  Surgeon: Lamar JONETTA Aho, MD;  Location: [facility];  Service: Endoscopy;  Laterality: N/A;   ESOPHAGOGASTRODUODENOSCOPY N/A 03/28/2013   Procedure: ESOPHAGOGASTRODUODENOSCOPY (EGD);  Surgeon: Lamar JONETTA Aho, MD;  Location: [facility];  Service: Endoscopy;  Laterality: N/A;   FISTULOTOMY N/A 02/18/2023   Procedure: REPAIR OF PERIRECTAL FISTULA. LEFT LATERAL INTERNAL SPHINCTEROTOMY, FISTULOTOMY, HEMORRHOIDAL LIGATION/RECTOPEXY TIMES THREE, MARSUPIALIZATION;  Surgeon: Sheldon Standing, MD;  Location: [facility];  Service: General;  Laterality: N/A;   HERNIA  REPAIR  AS INFANT   groin   MASS EXCISION N/A 02/18/2023   Procedure: REMOVAL OF MASS THIGH 5CM;  Surgeon: Sheldon Standing, MD;  Location: [facility];  Service: General;  Laterality: N/A;   RECTAL EXAM UNDER ANESTHESIA N/A 02/18/2023   Procedure: ANORECTAL EXAM UNDER ANESTHESIA;  Surgeon: Sheldon Standing, MD;  Location: [facility];  Service: General;  Laterality: N/A;   VENTRAL HERNIA REPAIR N/A 08/14/2023   Procedure: REPAIR, HERNIA, VENTRAL, LAPAROSCOPIC;  Surgeon: Sheldon Standing, MD;  Location: [facility];  Service: General;  Laterality: N/A;    Allergies:  Allergies  Allergen Reactions   Cefaclor Shortness Of Breath and Swelling   Dimetapp Dm Cold-Cough [Phenylephrine -Bromphen-Dm] Shortness Of Breath and Swelling   Furosemide Rash   Reglan  [Metoclopramide ] Anxiety and Other (See Comments)    Weight loss/mental breakdown    Family History:  Family History  Problem Relation Age of Onset   Colon cancer Mother 57   Irritable bowel syndrome Mother    Colon polyps Mother 8   Diabetes Mother    Kidney cancer Mother 34   Kidney disease Father    Congestive Heart Failure Father    Dementia Father    Kidney failure Father    Colon polyps Brother 61   Colon cancer Brother 69   Liver cancer Brother    Ovarian cancer Maternal Aunt    Liver cancer Maternal Aunt    Stomach cancer Maternal Aunt 79   Brain cancer Maternal  Aunt    Colon polyps Maternal Grandfather 67   Colon cancer Maternal Grandfather 67   Esophageal cancer Maternal Grandfather 72   Rectal cancer Neg Hx     Social History:  Social History   Tobacco Use   Smoking status: Never    Passive exposure: Never   Smokeless tobacco: Never  Vaping Use   Vaping status: Never Used  Substance Use Topics   Alcohol use: No   Drug use: No    Review of symptoms:  Constitutional:  Negative for unexplained weight loss, night sweats, fever, chills ENT:  Negative for nose bleeds, sinus pain, painful swallowing CV:  Negative for chest pain,  shortness of breath, exercise intolerance, palpitations, loss of consciousness Resp:  Negative for cough, wheezing, shortness of breath GI:  Negative for nausea, vomiting, diarrhea, bloody stools GU:  Positives noted in HPI; otherwise negative for gross hematuria, dysuria, urinary incontinence Neuro:  Negative for seizures, poor balance, limb weakness, slurred speech Psych:  Negative for lack of energy, depression, anxiety Endocrine:  Negative for polydipsia, polyuria, symptoms of hypoglycemia (dizziness, hunger, sweating) Hematologic:  Negative for anemia, purpura, petechia, prolonged or excessive bleeding, use of anticoagulants  Allergic:  Negative for difficulty breathing or choking as a result of exposure to anything; no shellfish allergy; no allergic response (rash/itch) to materials, foods  Physical exam: There were no vitals taken for this visit. GENERAL APPEARANCE:  Well appearing, well developed, well nourished, NAD HEENT:  Atraumatic, normocephalic, oropharynx clear NECK:  Supple without lymphadenopathy or thyromegaly ABDOMEN:  Soft, non-tender, no masses EXTREMITIES:  Moves all extremities well, without clubbing, cyanosis, or edema NEUROLOGIC:  Alert and oriented x 3, normal gait, CN II-XII grossly intact MENTAL STATUS:  appropriate BACK:  Non-tender to palpation, No CVAT SKIN:  Warm, dry, and intact GU: Penis:  {Exam; penis:5791} Meatus: {Meatus:15530} Scrotum: {pe scrotum:310183} Testis: {Exam; testicles:5790}  Results: None

## 2023-09-17 NOTE — Telephone Encounter (Signed)
 Pharmacy Patient Advocate Encounter   Received notification from Patient Pharmacy that prior authorization for Dexlansoprazole  60MG  dr capsules is required/requested.   Insurance verification completed.   The patient is insured through [facility] .   Per test claim: Refill too soon. PA is not needed at this time. Medication was filled 09-16-2023. Next eligible fill date is 10-09-2023.  Drug is on patient formulary and does not require a prior authorization

## 2023-10-30 ENCOUNTER — Encounter: Payer: Self-pay | Admitting: Pulmonary Disease

## 2023-10-30 ENCOUNTER — Ambulatory Visit (INDEPENDENT_AMBULATORY_CARE_PROVIDER_SITE_OTHER): Admitting: Pulmonary Disease

## 2023-10-30 VITALS — BP 121/79 | HR 67 | Ht 70.0 in | Wt 239.0 lb

## 2023-10-30 DIAGNOSIS — R0600 Dyspnea, unspecified: Secondary | ICD-10-CM

## 2023-10-30 DIAGNOSIS — G4733 Obstructive sleep apnea (adult) (pediatric): Secondary | ICD-10-CM

## 2023-10-30 DIAGNOSIS — R0602 Shortness of breath: Secondary | ICD-10-CM | POA: Diagnosis not present

## 2023-10-30 NOTE — Progress Notes (Signed)
 Synopsis: Referred in April 2023 for shortness of breath  Subjective:   PATIENT ID: Tim Nelson GENDER: male DOB: [birth year], MRN: [id number]  HPI  Chief Complaint  Patient presents with   Medical Management of Chronic Issues    Pt states flomax working well    Tim Nelson is a 48 year old male, never smoker with GERD, hypertension, and seasonal allergies who returns to pulmonary clinic for shortness of breath and obstructive sleep apnea.   He uses the CPAP machine consistently from 10 PM to 4-6 AM, totaling approximately six hours per night, with significant improvement in sleep quality and overall well-being. The CPAP mask is wearing out on one side, affecting its fit and function. He maintains his CPAP equipment well, regularly cleaning the mask and hoses.  Past Medical History:  Diagnosis Date   Allergy    Anxiety and depression    Autism    Diabetes mellitus without complication (HCC)    Dysthymic disorder    Enlarged liver    Esophageal stricture    Esophagitis, unspecified    Fatty liver    GERD (gastroesophageal reflux disease)    Hepatic fibrosis    History of chronic cough    dry cough-sleeps with elevated head of bed.   Hx of colonic polyps    Hyperlipemia    Hypertension    Lynch syndrome    Metabolic dysfunction-associated steatotic liver disease (MASLD)    Seasonal allergies      Family History  Problem Relation Age of Onset   Colon cancer Mother 58   Irritable bowel syndrome Mother    Colon polyps Mother 56   Diabetes Mother    Kidney cancer Mother 39   Kidney disease Father    Congestive Heart Failure Father    Dementia Father    Kidney failure Father    Colon polyps Brother 50   Colon cancer Brother 20   Liver cancer Brother    Ovarian cancer Maternal Aunt    Liver cancer Maternal Aunt    Stomach cancer Maternal Aunt 49   Brain cancer Maternal Aunt    Colon polyps Maternal Grandfather 25   Colon cancer Maternal Grandfather 67    Esophageal cancer Maternal Grandfather 72   Rectal cancer Neg Hx      Social History   Socioeconomic History   Marital status: Single    Spouse name: Not on file   Number of children: Not on file   Years of education: Not on file   Highest education level: Not on file  Occupational History   Occupation: Unemployed   Tobacco Use   Smoking status: Never    Passive exposure: Never   Smokeless tobacco: Never  Vaping Use   Vaping status: Never Used  Substance and Sexual Activity   Alcohol use: No   Drug use: No   Sexual activity: Never  Other Topics Concern   Not on file  Social History Narrative   Not on file   Social Drivers of Health   Financial Resource Strain: Not on file  Food Insecurity: Low Risk  (10/08/2022)   Received from [facility]   Hunger Vital Sign    Within the past 12 months, you worried that your food would run out before you got money to buy more: Never true    Within the past 12 months, the food you bought just didn't last and you didn't have money to get more. : Never true  Transportation Needs:  No Transportation Needs (10/08/2022)   Received from [facility]    In the past 12 months, has lack of reliable transportation kept you from medical appointments, meetings, work or from getting things needed for daily living? : No  Physical Activity: Not on file  Stress: Not on file  Social Connections: Unknown (06/02/2021)   Received from [facility]   Social Network    Social Network: Not on file  Intimate Partner Violence: Unknown (05/09/2021)   Received from [facility]   HITS    Physically Hurt: Not on file    Insult or Talk Down To: Not on file    Threaten Physical Harm: Not on file    Scream or Curse: Not on file     Allergies  Allergen Reactions   Cefaclor Shortness Of Breath and Swelling   Dimetapp Dm Cold-Cough [Phenylephrine -Bromphen-Dm] Shortness Of Breath and Swelling   Furosemide Rash   Reglan  [Metoclopramide ]  Anxiety and Other (See Comments)    Weight loss/mental breakdown     Outpatient Medications Prior to Visit  Medication Sig Dispense Refill   ARIPiprazole  (ABILIFY ) 5 MG tablet TAKE ONE (1) TABLET EACH DAY 30 tablet 0   atorvastatin (LIPITOR) 10 MG tablet Take 10 mg by mouth every evening.      cetirizine (ZYRTEC) 10 MG tablet Take 10 mg by mouth daily.     dexlansoprazole  (DEXILANT ) 60 MG capsule TAKE ONE CAPSULE BY MOUTH DAILY 90 capsule 3   fenofibrate micronized (LOFIBRA) 134 MG capsule Take 134 mg by mouth daily before breakfast.     hydrochlorothiazide (HYDRODIURIL) 25 MG tablet Take 25 mg by mouth daily.     lamoTRIgine  (LAMICTAL ) 25 MG tablet TAKE 1 TABLET DAILY FOR 1 WEEK THEN TAKE 2 TABLETS DAILY (Patient taking differently: Take 50 mg by mouth daily.) 60 tablet 0   lisinopril (ZESTRIL) 5 MG tablet Take 5 mg by mouth daily.     metFORMIN (GLUCOPHAGE-XR) 500 MG 24 hr tablet Take 500 mg by mouth daily with breakfast.     methocarbamol  (ROBAXIN ) 500 MG tablet Take 1 tablet (500 mg total) by mouth every 8 (eight) hours as needed for muscle spasms. 20 tablet 1   montelukast (SINGULAIR) 10 MG tablet Take 10 mg by mouth at bedtime.     NON FORMULARY Pt uses a cpap nightly     oxyCODONE  (OXY IR/ROXICODONE ) 5 MG immediate release tablet Take 1-2 tablets (5-10 mg total) by mouth every 6 (six) hours as needed for moderate pain (pain score 4-6), severe pain (pain score 7-10) or breakthrough pain. 30 tablet 0   oxyCODONE  (OXY IR/ROXICODONE ) 5 MG immediate release tablet Take 1 tablet (5 mg total) by mouth every 6 (six) hours as needed for severe pain (pain score 7-10) or breakthrough pain. 30 tablet 0   sertraline  (ZOLOFT ) 50 MG tablet Take 50 mg by mouth daily.     No facility-administered medications prior to visit.   Review of Systems  Constitutional:  Negative for chills, fever, malaise/fatigue and weight loss.  HENT:  Negative for congestion, sinus pain and sore throat.   Eyes: Negative.    Respiratory:  Negative for cough, hemoptysis, sputum production, shortness of breath and wheezing.   Cardiovascular:  Negative for chest pain, palpitations, orthopnea, claudication and leg swelling.  Gastrointestinal:  Negative for abdominal pain, heartburn, nausea and vomiting.  Genitourinary: Negative.   Musculoskeletal:  Negative for joint pain and myalgias.  Skin:  Negative for rash.  Neurological:  Negative for weakness.  Endo/Heme/Allergies:  Negative for environmental allergies.  Psychiatric/Behavioral: Negative.      Objective:   Vitals:   10/30/23 0908  BP: 121/79  Pulse: 67  SpO2: 96%  Weight: 239 lb (108.4 kg)  Height: 5' 10 (1.778 m)    Physical Exam Constitutional:      General: He is not in acute distress.    Appearance: He is obese.  HENT:     Head: Normocephalic and atraumatic.  Eyes:     Conjunctiva/sclera: Conjunctivae normal.  Cardiovascular:     Rate and Rhythm: Normal rate and regular rhythm.     Pulses: Normal pulses.     Heart sounds: Normal heart sounds. No murmur heard. Pulmonary:     Effort: Pulmonary effort is normal.     Breath sounds: Normal breath sounds.  Neurological:     Mental Status: He is alert.    CBC    Component Value Date/Time   WBC 6.9 08/03/2023 1135   RBC 5.17 08/03/2023 1135   HGB 15.1 08/03/2023 1135   HCT 45.0 08/03/2023 1135   PLT 302 08/03/2023 1135   MCV 87.0 08/03/2023 1135   MCH 29.2 08/03/2023 1135   MCHC 33.6 08/03/2023 1135   RDW 12.4 08/03/2023 1135   LYMPHSABS 2.2 05/26/2019 1439   MONOABS 0.8 05/26/2019 1439   EOSABS 0.2 05/26/2019 1439   BASOSABS 0.1 05/26/2019 1439   Chest imaging: CXR 04/12/21 Cardiovascular: Cardiac silhouette and pulmonary vasculature are within normal limits.  Mediastinum: Within normal limits.  Lungs/pleura: Appropriate pulmonary expansion without focal consolidative airspace disease identified. No overt pulmonary edema. No pleural effusions or discernible pneumothorax.   Upper abdomen: Visualized portions are unremarkable.  Chest wall/osseous structures: Unremarkable.  CXR 07/08/19 Heart and mediastinal contours are within normal limits. No focal opacities or effusions. No acute bony abnormality.  PFT:    Latest Ref Rng & Units 03/25/2022    5:05 PM  PFT Results  FVC-Predicted Pre % 99   FVC-Post L 4.88   FVC-Predicted Post % 96   Pre FEV1/FVC % % 82   Post FEV1/FCV % % 83   FEV1-Pre L 4.11   FEV1-Predicted Pre % 103   FEV1-Post L 4.07   DLCO uncorrected ml/min/mmHg 31.48   DLCO UNC% % 106   DLCO corrected ml/min/mmHg 31.48   DLCO COR %Predicted % 106   DLVA Predicted % 109   TLC L 6.88   TLC % Predicted % 101   RV % Predicted % 107     Labs:  Path:  Echo 06/06/21: LV EF 60-65%. RV systolic function is normal.   Heart Catheterization:  Assessment & Plan:   OSA (obstructive sleep apnea)  Dyspnea, unspecified type  Discussion: Armoni Depass is a 48 year old male, never smoker with GERD, hypertension, and seasonal allergies who returns to pulmonary clinic for shortness of breath and OSA.  Obstructive Sleep Apnea - compliance report shows effective usage - continue CPAP nightly - will update mask/supplies refill order  Follow up in 1 year, call sooner if needed.   Dorn Chill, MD [facility] Office: [phone]   Current Outpatient Medications:    ARIPiprazole  (ABILIFY ) 5 MG tablet, TAKE ONE (1) TABLET EACH DAY, Disp: 30 tablet, Rfl: 0   atorvastatin (LIPITOR) 10 MG tablet, Take 10 mg by mouth every evening. , Disp: , Rfl:    cetirizine (ZYRTEC) 10 MG tablet, Take 10 mg by mouth daily., Disp: , Rfl:  dexlansoprazole  (DEXILANT ) 60 MG capsule, TAKE ONE CAPSULE BY MOUTH DAILY, Disp: 90 capsule, Rfl: 3   fenofibrate micronized (LOFIBRA) 134 MG capsule, Take 134 mg by mouth daily before breakfast., Disp: , Rfl:    hydrochlorothiazide (HYDRODIURIL) 25 MG tablet, Take 25 mg by mouth daily., Disp: , Rfl:     lamoTRIgine  (LAMICTAL ) 25 MG tablet, TAKE 1 TABLET DAILY FOR 1 WEEK THEN TAKE 2 TABLETS DAILY (Patient taking differently: Take 50 mg by mouth daily.), Disp: 60 tablet, Rfl: 0   lisinopril (ZESTRIL) 5 MG tablet, Take 5 mg by mouth daily., Disp: , Rfl:    metFORMIN (GLUCOPHAGE-XR) 500 MG 24 hr tablet, Take 500 mg by mouth daily with breakfast., Disp: , Rfl:    methocarbamol  (ROBAXIN ) 500 MG tablet, Take 1 tablet (500 mg total) by mouth every 8 (eight) hours as needed for muscle spasms., Disp: 20 tablet, Rfl: 1   montelukast (SINGULAIR) 10 MG tablet, Take 10 mg by mouth at bedtime., Disp: , Rfl:    NON FORMULARY, Pt uses a cpap nightly, Disp: , Rfl:    oxyCODONE  (OXY IR/ROXICODONE ) 5 MG immediate release tablet, Take 1-2 tablets (5-10 mg total) by mouth every 6 (six) hours as needed for moderate pain (pain score 4-6), severe pain (pain score 7-10) or breakthrough pain., Disp: 30 tablet, Rfl: 0   oxyCODONE  (OXY IR/ROXICODONE ) 5 MG immediate release tablet, Take 1 tablet (5 mg total) by mouth every 6 (six) hours as needed for severe pain (pain score 7-10) or breakthrough pain., Disp: 30 tablet, Rfl: 0   sertraline  (ZOLOFT ) 50 MG tablet, Take 50 mg by mouth daily., Disp: , Rfl:

## 2023-10-30 NOTE — Patient Instructions (Addendum)
 We will order you mask and CPAP supply refills  Continue cpap use nightly, your compliance report looks great  Follow up in 1 year, call sooner if needed

## 2024-02-16 ENCOUNTER — Other Ambulatory Visit: Payer: Self-pay | Admitting: Gastroenterology

## 2024-03-04 ENCOUNTER — Encounter: Payer: Self-pay | Admitting: Pediatrics

## 2024-03-11 ENCOUNTER — Encounter: Payer: Self-pay | Admitting: Gastroenterology

## 2024-05-02 ENCOUNTER — Encounter

## 2024-05-16 ENCOUNTER — Encounter: Admitting: Gastroenterology
